# Patient Record
Sex: Male | Born: 1935 | Race: White | Hispanic: No | Marital: Married | Smoking: Former smoker
Health system: Southern US, Community
[De-identification: ages and names within clinical notes are randomized; demographics above are authoritative.]

## PROBLEM LIST (undated history)

## (undated) DIAGNOSIS — R233 Spontaneous ecchymoses: Secondary | ICD-10-CM

## (undated) DIAGNOSIS — M75100 Unspecified rotator cuff tear or rupture of unspecified shoulder, not specified as traumatic: Secondary | ICD-10-CM

## (undated) DIAGNOSIS — M1990 Unspecified osteoarthritis, unspecified site: Secondary | ICD-10-CM

## (undated) DIAGNOSIS — Z5189 Encounter for other specified aftercare: Secondary | ICD-10-CM

## (undated) DIAGNOSIS — T7840XA Allergy, unspecified, initial encounter: Secondary | ICD-10-CM

## (undated) DIAGNOSIS — K219 Gastro-esophageal reflux disease without esophagitis: Secondary | ICD-10-CM

## (undated) DIAGNOSIS — R238 Other skin changes: Secondary | ICD-10-CM

## (undated) DIAGNOSIS — K274 Chronic or unspecified peptic ulcer, site unspecified, with hemorrhage: Secondary | ICD-10-CM

## (undated) DIAGNOSIS — H409 Unspecified glaucoma: Secondary | ICD-10-CM

## (undated) DIAGNOSIS — K222 Esophageal obstruction: Secondary | ICD-10-CM

## (undated) DIAGNOSIS — I10 Essential (primary) hypertension: Secondary | ICD-10-CM

## (undated) DIAGNOSIS — K449 Diaphragmatic hernia without obstruction or gangrene: Secondary | ICD-10-CM

## (undated) DIAGNOSIS — J309 Allergic rhinitis, unspecified: Secondary | ICD-10-CM

## (undated) DIAGNOSIS — K5730 Diverticulosis of large intestine without perforation or abscess without bleeding: Secondary | ICD-10-CM

## (undated) DIAGNOSIS — N400 Enlarged prostate without lower urinary tract symptoms: Secondary | ICD-10-CM

## (undated) DIAGNOSIS — K279 Peptic ulcer, site unspecified, unspecified as acute or chronic, without hemorrhage or perforation: Secondary | ICD-10-CM

## (undated) DIAGNOSIS — I2510 Atherosclerotic heart disease of native coronary artery without angina pectoris: Secondary | ICD-10-CM

## (undated) DIAGNOSIS — E785 Hyperlipidemia, unspecified: Secondary | ICD-10-CM

## (undated) DIAGNOSIS — K648 Other hemorrhoids: Secondary | ICD-10-CM

## (undated) DIAGNOSIS — I219 Acute myocardial infarction, unspecified: Secondary | ICD-10-CM

## (undated) HISTORY — PX: COLONOSCOPY: SHX174

## (undated) HISTORY — DX: Esophageal obstruction: K22.2

## (undated) HISTORY — DX: Unspecified osteoarthritis, unspecified site: M19.90

## (undated) HISTORY — DX: Unspecified rotator cuff tear or rupture of unspecified shoulder, not specified as traumatic: M75.100

## (undated) HISTORY — DX: Peptic ulcer, site unspecified, unspecified as acute or chronic, without hemorrhage or perforation: K27.9

## (undated) HISTORY — PX: UPPER GASTROINTESTINAL ENDOSCOPY: SHX188

## (undated) HISTORY — DX: Gastro-esophageal reflux disease without esophagitis: K21.9

## (undated) HISTORY — DX: Atherosclerotic heart disease of native coronary artery without angina pectoris: I25.10

## (undated) HISTORY — DX: Unspecified glaucoma: H40.9

## (undated) HISTORY — PX: EYE SURGERY: SHX253

## (undated) HISTORY — DX: Essential (primary) hypertension: I10

## (undated) HISTORY — PX: CATARACT EXTRACTION: SUR2

## (undated) HISTORY — PX: TONSILLECTOMY: SUR1361

## (undated) HISTORY — DX: Allergic rhinitis, unspecified: J30.9

## (undated) HISTORY — DX: Other hemorrhoids: K64.8

## (undated) HISTORY — PX: CORONARY ANGIOPLASTY WITH STENT PLACEMENT: SHX49

## (undated) HISTORY — PX: UPPER GI ENDOSCOPY: SHX6162

## (undated) HISTORY — DX: Hyperlipidemia, unspecified: E78.5

## (undated) HISTORY — DX: Encounter for other specified aftercare: Z51.89

## (undated) HISTORY — DX: Diverticulosis of large intestine without perforation or abscess without bleeding: K57.30

## (undated) HISTORY — DX: Diaphragmatic hernia without obstruction or gangrene: K44.9

## (undated) HISTORY — PX: CORONARY ARTERY BYPASS GRAFT: SHX141

## (undated) HISTORY — PX: CORONARY ANGIOPLASTY: SHX604

## (undated) HISTORY — DX: Allergy, unspecified, initial encounter: T78.40XA

---

## 1983-11-30 DIAGNOSIS — K274 Chronic or unspecified peptic ulcer, site unspecified, with hemorrhage: Secondary | ICD-10-CM

## 1983-11-30 DIAGNOSIS — K284 Chronic or unspecified gastrojejunal ulcer with hemorrhage: Secondary | ICD-10-CM

## 1983-11-30 HISTORY — DX: Chronic or unspecified peptic ulcer, site unspecified, with hemorrhage: K27.4

## 1983-11-30 HISTORY — DX: Chronic or unspecified gastrojejunal ulcer with hemorrhage: K28.4

## 1985-11-29 DIAGNOSIS — I219 Acute myocardial infarction, unspecified: Secondary | ICD-10-CM

## 1985-11-29 HISTORY — DX: Acute myocardial infarction, unspecified: I21.9

## 1995-05-09 ENCOUNTER — Encounter (INDEPENDENT_AMBULATORY_CARE_PROVIDER_SITE_OTHER): Payer: Self-pay | Admitting: *Deleted

## 1995-05-10 ENCOUNTER — Encounter (INDEPENDENT_AMBULATORY_CARE_PROVIDER_SITE_OTHER): Payer: Self-pay | Admitting: *Deleted

## 1996-05-05 ENCOUNTER — Encounter (INDEPENDENT_AMBULATORY_CARE_PROVIDER_SITE_OTHER): Payer: Self-pay | Admitting: *Deleted

## 1996-07-09 ENCOUNTER — Encounter (INDEPENDENT_AMBULATORY_CARE_PROVIDER_SITE_OTHER): Payer: Self-pay | Admitting: *Deleted

## 1997-11-07 ENCOUNTER — Encounter (INDEPENDENT_AMBULATORY_CARE_PROVIDER_SITE_OTHER): Payer: Self-pay | Admitting: *Deleted

## 1998-06-03 ENCOUNTER — Ambulatory Visit (HOSPITAL_COMMUNITY): Admission: RE | Admit: 1998-06-03 | Discharge: 1998-06-03 | Payer: Self-pay | Admitting: Vascular Surgery

## 1998-06-20 ENCOUNTER — Ambulatory Visit (HOSPITAL_COMMUNITY): Admission: RE | Admit: 1998-06-20 | Discharge: 1998-06-20 | Payer: Self-pay | Admitting: Cardiovascular Disease

## 1998-07-02 ENCOUNTER — Ambulatory Visit (HOSPITAL_COMMUNITY): Admission: RE | Admit: 1998-07-02 | Discharge: 1998-07-02 | Payer: Self-pay | Admitting: *Deleted

## 1998-07-04 ENCOUNTER — Inpatient Hospital Stay (HOSPITAL_COMMUNITY): Admission: AD | Admit: 1998-07-04 | Discharge: 1998-07-08 | Payer: Self-pay | Admitting: Cardiovascular Disease

## 1999-11-19 ENCOUNTER — Encounter: Payer: Self-pay | Admitting: Rheumatology

## 1999-11-19 ENCOUNTER — Encounter: Admission: RE | Admit: 1999-11-19 | Discharge: 1999-11-19 | Payer: Self-pay | Admitting: Rheumatology

## 2001-02-07 HISTORY — PX: CARDIOVASCULAR STRESS TEST: SHX262

## 2003-01-12 ENCOUNTER — Inpatient Hospital Stay (HOSPITAL_COMMUNITY): Admission: EM | Admit: 2003-01-12 | Discharge: 2003-01-14 | Payer: Self-pay

## 2003-01-14 HISTORY — PX: CARDIAC CATHETERIZATION: SHX172

## 2003-05-20 ENCOUNTER — Encounter (INDEPENDENT_AMBULATORY_CARE_PROVIDER_SITE_OTHER): Payer: Self-pay | Admitting: *Deleted

## 2003-07-10 ENCOUNTER — Ambulatory Visit (HOSPITAL_COMMUNITY): Admission: RE | Admit: 2003-07-10 | Discharge: 2003-07-10 | Payer: Self-pay | Admitting: *Deleted

## 2004-09-30 ENCOUNTER — Ambulatory Visit: Payer: Self-pay | Admitting: Internal Medicine

## 2004-12-30 ENCOUNTER — Ambulatory Visit: Payer: Self-pay | Admitting: Internal Medicine

## 2005-01-06 ENCOUNTER — Ambulatory Visit: Payer: Self-pay | Admitting: Internal Medicine

## 2005-01-18 ENCOUNTER — Inpatient Hospital Stay (HOSPITAL_BASED_OUTPATIENT_CLINIC_OR_DEPARTMENT_OTHER): Admission: RE | Admit: 2005-01-18 | Discharge: 2005-01-18 | Payer: Self-pay | Admitting: Cardiovascular Disease

## 2005-01-18 HISTORY — PX: CARDIAC CATHETERIZATION: SHX172

## 2005-05-26 ENCOUNTER — Ambulatory Visit: Payer: Self-pay | Admitting: Internal Medicine

## 2005-07-06 ENCOUNTER — Ambulatory Visit: Payer: Self-pay | Admitting: Internal Medicine

## 2005-07-09 ENCOUNTER — Ambulatory Visit: Payer: Self-pay | Admitting: Internal Medicine

## 2005-08-23 HISTORY — PX: US ECHOCARDIOGRAPHY: HXRAD669

## 2005-11-15 ENCOUNTER — Ambulatory Visit: Payer: Self-pay | Admitting: Internal Medicine

## 2006-05-09 ENCOUNTER — Encounter (INDEPENDENT_AMBULATORY_CARE_PROVIDER_SITE_OTHER): Payer: Self-pay | Admitting: *Deleted

## 2006-05-16 ENCOUNTER — Ambulatory Visit: Payer: Self-pay | Admitting: Internal Medicine

## 2006-05-30 ENCOUNTER — Ambulatory Visit (HOSPITAL_COMMUNITY): Admission: RE | Admit: 2006-05-30 | Discharge: 2006-05-30 | Payer: Self-pay | Admitting: *Deleted

## 2006-05-30 ENCOUNTER — Encounter (INDEPENDENT_AMBULATORY_CARE_PROVIDER_SITE_OTHER): Payer: Self-pay | Admitting: Specialist

## 2006-05-30 ENCOUNTER — Encounter (INDEPENDENT_AMBULATORY_CARE_PROVIDER_SITE_OTHER): Payer: Self-pay | Admitting: *Deleted

## 2006-07-14 ENCOUNTER — Encounter (INDEPENDENT_AMBULATORY_CARE_PROVIDER_SITE_OTHER): Payer: Self-pay | Admitting: *Deleted

## 2006-11-08 ENCOUNTER — Ambulatory Visit: Payer: Self-pay | Admitting: Internal Medicine

## 2006-11-08 LAB — CONVERTED CEMR LAB
ALT: 21 units/L (ref 0–40)
AST: 26 units/L (ref 0–37)
Albumin: 4.1 g/dL (ref 3.5–5.2)
Alkaline Phosphatase: 47 units/L (ref 39–117)
BUN: 10 mg/dL (ref 6–23)
Bilirubin, Direct: 0.1 mg/dL (ref 0.0–0.3)
CO2: 33 meq/L — ABNORMAL HIGH (ref 19–32)
Calcium: 10 mg/dL (ref 8.4–10.5)
Chloride: 103 meq/L (ref 96–112)
Chol/HDL Ratio, serum: 3.3
Cholesterol: 161 mg/dL (ref 0–200)
Creatinine, Ser: 1 mg/dL (ref 0.4–1.5)
GFR calc non Af Amer: 78 mL/min
Glomerular Filtration Rate, Af Am: 95 mL/min/{1.73_m2}
Glucose, Bld: 98 mg/dL (ref 70–99)
HCT: 41.2 % (ref 39.0–52.0)
HDL: 48.2 mg/dL (ref >39.0)
Hemoglobin: 14.1 g/dL (ref 13.0–17.0)
LDL Cholesterol: 89 mg/dL (ref 0–99)
MCHC: 34.3 g/dL (ref 30.0–36.0)
MCV: 96 fL (ref 78.0–100.0)
PSA: 0.79 ng/mL (ref 0.10–4.00)
Platelets: 193 10*3/uL (ref 150–400)
Potassium: 5.1 meq/L (ref 3.5–5.1)
RBC: 4.29 M/uL (ref 4.22–5.81)
RDW: 12.8 % (ref 11.5–14.6)
Sodium: 142 meq/L (ref 135–145)
Total Bilirubin: 1.1 mg/dL (ref 0.3–1.2)
Total Protein: 7.3 g/dL (ref 6.0–8.3)
Triglyceride fasting, serum: 119 mg/dL (ref 0–149)
VLDL: 24 mg/dL (ref 0–40)
WBC: 4 10*3/uL — ABNORMAL LOW (ref 4.5–10.5)

## 2006-12-20 ENCOUNTER — Ambulatory Visit: Payer: Self-pay | Admitting: Internal Medicine

## 2007-01-30 ENCOUNTER — Ambulatory Visit: Payer: Self-pay | Admitting: Internal Medicine

## 2007-01-30 LAB — CONVERTED CEMR LAB
AST: 33 units/L (ref 0–37)
BUN: 16 mg/dL (ref 6–23)
CO2: 34 meq/L — ABNORMAL HIGH (ref 19–32)
Calcium: 9.8 mg/dL (ref 8.4–10.5)
Chloride: 108 meq/L (ref 96–112)
Cholesterol: 139 mg/dL (ref 0–200)
Creatinine, Ser: 0.7 mg/dL (ref 0.4–1.5)
GFR calc Af Amer: 143 mL/min
GFR calc non Af Amer: 118 mL/min
Glucose, Bld: 126 mg/dL — ABNORMAL HIGH (ref 70–99)
Potassium: 4.9 meq/L (ref 3.5–5.1)
Sodium: 145 meq/L (ref 135–145)

## 2007-05-02 ENCOUNTER — Ambulatory Visit: Payer: Self-pay | Admitting: Internal Medicine

## 2007-05-04 ENCOUNTER — Encounter: Payer: Self-pay | Admitting: Internal Medicine

## 2007-05-04 DIAGNOSIS — I10 Essential (primary) hypertension: Secondary | ICD-10-CM

## 2007-05-04 DIAGNOSIS — I2510 Atherosclerotic heart disease of native coronary artery without angina pectoris: Secondary | ICD-10-CM | POA: Insufficient documentation

## 2007-05-04 DIAGNOSIS — K279 Peptic ulcer, site unspecified, unspecified as acute or chronic, without hemorrhage or perforation: Secondary | ICD-10-CM

## 2007-05-04 DIAGNOSIS — M1990 Unspecified osteoarthritis, unspecified site: Secondary | ICD-10-CM

## 2007-05-04 DIAGNOSIS — E785 Hyperlipidemia, unspecified: Secondary | ICD-10-CM

## 2007-05-04 DIAGNOSIS — K219 Gastro-esophageal reflux disease without esophagitis: Secondary | ICD-10-CM

## 2007-05-04 DIAGNOSIS — J309 Allergic rhinitis, unspecified: Secondary | ICD-10-CM

## 2007-05-04 HISTORY — DX: Peptic ulcer, site unspecified, unspecified as acute or chronic, without hemorrhage or perforation: K27.9

## 2007-05-04 HISTORY — DX: Essential (primary) hypertension: I10

## 2007-05-04 HISTORY — DX: Allergic rhinitis, unspecified: J30.9

## 2007-05-04 HISTORY — DX: Unspecified osteoarthritis, unspecified site: M19.90

## 2007-05-04 HISTORY — DX: Atherosclerotic heart disease of native coronary artery without angina pectoris: I25.10

## 2007-05-04 HISTORY — DX: Gastro-esophageal reflux disease without esophagitis: K21.9

## 2007-05-04 HISTORY — DX: Hyperlipidemia, unspecified: E78.5

## 2007-06-12 ENCOUNTER — Inpatient Hospital Stay (HOSPITAL_BASED_OUTPATIENT_CLINIC_OR_DEPARTMENT_OTHER): Admission: RE | Admit: 2007-06-12 | Discharge: 2007-06-12 | Payer: Self-pay | Admitting: Cardiovascular Disease

## 2007-06-12 HISTORY — PX: CARDIAC CATHETERIZATION: SHX172

## 2007-06-28 ENCOUNTER — Encounter: Payer: Self-pay | Admitting: Internal Medicine

## 2007-08-14 ENCOUNTER — Ambulatory Visit: Payer: Self-pay | Admitting: Internal Medicine

## 2007-11-24 ENCOUNTER — Ambulatory Visit: Payer: Self-pay | Admitting: Internal Medicine

## 2007-11-24 DIAGNOSIS — J069 Acute upper respiratory infection, unspecified: Secondary | ICD-10-CM | POA: Insufficient documentation

## 2007-12-14 ENCOUNTER — Ambulatory Visit: Payer: Self-pay | Admitting: Internal Medicine

## 2008-04-17 ENCOUNTER — Ambulatory Visit: Payer: Self-pay | Admitting: Internal Medicine

## 2008-04-17 LAB — CONVERTED CEMR LAB
ALT: 20 units/L (ref 0–53)
AST: 23 units/L (ref 0–37)
Albumin: 4.1 g/dL (ref 3.5–5.2)
Alkaline Phosphatase: 43 units/L (ref 39–117)
BUN: 14 mg/dL (ref 6–23)
Basophils Absolute: 0 10*3/uL (ref 0.0–0.1)
Basophils Relative: 0.4 % (ref 0.0–1.0)
Bilirubin Urine: NEGATIVE
Bilirubin, Direct: 0.1 mg/dL (ref 0.0–0.3)
Blood in Urine, dipstick: NEGATIVE
CO2: 32 meq/L (ref 19–32)
Calcium: 9.8 mg/dL (ref 8.4–10.5)
Chloride: 105 meq/L (ref 96–112)
Cholesterol: 157 mg/dL (ref 0–200)
Creatinine, Ser: 0.9 mg/dL (ref 0.4–1.5)
Eosinophils Absolute: 0.3 10*3/uL (ref 0.0–0.7)
Eosinophils Relative: 6.6 % — ABNORMAL HIGH (ref 0.0–5.0)
GFR calc Af Amer: 106 mL/min
GFR calc non Af Amer: 88 mL/min
Glucose, Bld: 114 mg/dL — ABNORMAL HIGH (ref 70–99)
Glucose, Urine, Semiquant: NEGATIVE
HCT: 42.6 % (ref 39.0–52.0)
HDL: 45.3 mg/dL (ref >39.0)
Hemoglobin: 14.3 g/dL (ref 13.0–17.0)
LDL Cholesterol: 96 mg/dL (ref 0–99)
Lymphocytes Relative: 21.3 % (ref 12.0–46.0)
MCHC: 33.6 g/dL (ref 30.0–36.0)
MCV: 98.7 fL (ref 78.0–100.0)
Monocytes Absolute: 0.6 10*3/uL (ref 0.1–1.0)
Monocytes Relative: 12.7 % — ABNORMAL HIGH (ref 3.0–12.0)
Neutro Abs: 2.6 10*3/uL (ref 1.4–7.7)
Neutrophils Relative %: 59 % (ref 43.0–77.0)
Nitrite: NEGATIVE
PSA: 0.87 ng/mL (ref 0.10–4.00)
Platelets: 203 10*3/uL (ref 150–400)
Potassium: 5.2 meq/L — ABNORMAL HIGH (ref 3.5–5.1)
RBC: 4.32 M/uL (ref 4.22–5.81)
RDW: 12.8 % (ref 11.5–14.6)
Sodium: 140 meq/L (ref 135–145)
Specific Gravity, Urine: 1.025
TSH: 1.04 microintl units/mL (ref 0.35–5.50)
Total Bilirubin: 1 mg/dL (ref 0.3–1.2)
Total CHOL/HDL Ratio: 3.5
Total Protein: 7.3 g/dL (ref 6.0–8.3)
Triglycerides: 79 mg/dL (ref 0–149)
Urobilinogen, UA: 0.2
VLDL: 16 mg/dL (ref 0–40)
WBC Urine, dipstick: NEGATIVE
WBC: 4.5 10*3/uL (ref 4.5–10.5)
pH: 6

## 2008-04-24 ENCOUNTER — Ambulatory Visit: Payer: Self-pay | Admitting: Internal Medicine

## 2008-09-20 ENCOUNTER — Ambulatory Visit: Payer: Self-pay | Admitting: Family Medicine

## 2008-09-20 DIAGNOSIS — J209 Acute bronchitis, unspecified: Secondary | ICD-10-CM

## 2008-10-21 ENCOUNTER — Ambulatory Visit: Payer: Self-pay | Admitting: Internal Medicine

## 2008-11-11 ENCOUNTER — Telehealth: Payer: Self-pay | Admitting: Internal Medicine

## 2008-11-14 ENCOUNTER — Encounter: Payer: Self-pay | Admitting: Internal Medicine

## 2009-04-03 ENCOUNTER — Ambulatory Visit: Payer: Self-pay | Admitting: Internal Medicine

## 2009-04-10 ENCOUNTER — Encounter: Payer: Self-pay | Admitting: Internal Medicine

## 2009-07-23 ENCOUNTER — Ambulatory Visit: Payer: Self-pay | Admitting: Family Medicine

## 2009-07-30 ENCOUNTER — Telehealth: Payer: Self-pay | Admitting: Internal Medicine

## 2009-07-30 DIAGNOSIS — R1013 Epigastric pain: Secondary | ICD-10-CM | POA: Insufficient documentation

## 2009-08-14 DIAGNOSIS — K5730 Diverticulosis of large intestine without perforation or abscess without bleeding: Secondary | ICD-10-CM

## 2009-08-14 DIAGNOSIS — K222 Esophageal obstruction: Secondary | ICD-10-CM

## 2009-08-14 DIAGNOSIS — K449 Diaphragmatic hernia without obstruction or gangrene: Secondary | ICD-10-CM | POA: Insufficient documentation

## 2009-08-14 HISTORY — DX: Diaphragmatic hernia without obstruction or gangrene: K44.9

## 2009-08-14 HISTORY — DX: Esophageal obstruction: K22.2

## 2009-08-14 HISTORY — DX: Diverticulosis of large intestine without perforation or abscess without bleeding: K57.30

## 2009-08-18 ENCOUNTER — Ambulatory Visit: Payer: Self-pay | Admitting: Internal Medicine

## 2009-08-19 LAB — CONVERTED CEMR LAB
Basophils Absolute: 0 10*3/uL (ref 0.0–0.1)
Basophils Relative: 0.3 % (ref 0.0–3.0)
Eosinophils Absolute: 0.2 10*3/uL (ref 0.0–0.7)
Eosinophils Relative: 3.7 % (ref 0.0–5.0)
HCT: 40.9 % (ref 39.0–52.0)
Hemoglobin: 14.1 g/dL (ref 13.0–17.0)
Lymphocytes Relative: 15.5 % (ref 12.0–46.0)
Lymphs Abs: 1 10*3/uL (ref 0.7–4.0)
MCHC: 34.5 g/dL (ref 30.0–36.0)
MCV: 98.9 fL (ref 78.0–100.0)
Monocytes Absolute: 0.7 10*3/uL (ref 0.1–1.0)
Monocytes Relative: 10.7 % (ref 3.0–12.0)
Neutro Abs: 4.6 10*3/uL (ref 1.4–7.7)
Neutrophils Relative %: 69.8 % (ref 43.0–77.0)
Platelets: 204 10*3/uL (ref 150.0–400.0)
RBC: 4.14 M/uL — ABNORMAL LOW (ref 4.22–5.81)
RDW: 12.4 % (ref 11.5–14.6)
WBC: 6.5 10*3/uL (ref 4.5–10.5)

## 2009-09-03 ENCOUNTER — Encounter (INDEPENDENT_AMBULATORY_CARE_PROVIDER_SITE_OTHER): Payer: Self-pay

## 2009-09-09 ENCOUNTER — Ambulatory Visit: Payer: Self-pay | Admitting: Internal Medicine

## 2009-10-06 ENCOUNTER — Ambulatory Visit: Payer: Self-pay | Admitting: Internal Medicine

## 2009-10-06 LAB — CONVERTED CEMR LAB
ALT: 19 units/L (ref 0–53)
AST: 21 units/L (ref 0–37)
Albumin: 4 g/dL (ref 3.5–5.2)
Alkaline Phosphatase: 47 units/L (ref 39–117)
BUN: 14 mg/dL (ref 6–23)
Bilirubin, Direct: 0.1 mg/dL (ref 0.0–0.3)
CO2: 31 meq/L (ref 19–32)
Calcium: 9.4 mg/dL (ref 8.4–10.5)
Chloride: 104 meq/L (ref 96–112)
Cholesterol: 159 mg/dL (ref 0–200)
Creatinine, Ser: 0.8 mg/dL (ref 0.4–1.5)
GFR calc non Af Amer: 100.21 mL/min (ref >60)
Glucose, Bld: 108 mg/dL — ABNORMAL HIGH (ref 70–99)
HDL: 45.9 mg/dL (ref >39.00)
LDL Cholesterol: 94 mg/dL (ref 0–99)
PSA: 1.01 ng/mL (ref 0.10–4.00)
Potassium: 4.4 meq/L (ref 3.5–5.1)
Sodium: 143 meq/L (ref 135–145)
TSH: 0.75 microintl units/mL (ref 0.35–5.50)
Total Bilirubin: 1 mg/dL (ref 0.3–1.2)
Total CHOL/HDL Ratio: 3
Total Protein: 6.8 g/dL (ref 6.0–8.3)
Triglycerides: 98 mg/dL (ref 0.0–149.0)
VLDL: 19.6 mg/dL (ref 0.0–40.0)

## 2009-10-21 ENCOUNTER — Encounter (INDEPENDENT_AMBULATORY_CARE_PROVIDER_SITE_OTHER): Payer: Self-pay | Admitting: *Deleted

## 2010-01-07 ENCOUNTER — Telehealth: Payer: Self-pay | Admitting: Internal Medicine

## 2010-04-06 ENCOUNTER — Ambulatory Visit: Payer: Self-pay | Admitting: Internal Medicine

## 2010-05-14 ENCOUNTER — Telehealth: Payer: Self-pay | Admitting: Internal Medicine

## 2010-05-20 ENCOUNTER — Ambulatory Visit: Payer: Self-pay | Admitting: Internal Medicine

## 2010-06-26 ENCOUNTER — Ambulatory Visit: Payer: Self-pay | Admitting: Internal Medicine

## 2010-06-26 DIAGNOSIS — S61209A Unspecified open wound of unspecified finger without damage to nail, initial encounter: Secondary | ICD-10-CM | POA: Insufficient documentation

## 2010-07-22 ENCOUNTER — Ambulatory Visit: Payer: Self-pay | Admitting: Cardiovascular Disease

## 2010-09-17 ENCOUNTER — Telehealth: Payer: Self-pay | Admitting: Internal Medicine

## 2010-10-08 ENCOUNTER — Ambulatory Visit: Payer: Self-pay | Admitting: Internal Medicine

## 2010-10-08 LAB — CONVERTED CEMR LAB
BUN: 13 mg/dL (ref 6–23)
Basophils Absolute: 0 10*3/uL (ref 0.0–0.1)
Bilirubin, Direct: 0.1 mg/dL (ref 0.0–0.3)
Chloride: 106 meq/L (ref 96–112)
Creatinine, Ser: 0.7 mg/dL (ref 0.4–1.5)
Eosinophils Absolute: 0.3 10*3/uL (ref 0.0–0.7)
Eosinophils Relative: 7 % — ABNORMAL HIGH (ref 0.0–5.0)
Glucose, Bld: 101 mg/dL — ABNORMAL HIGH (ref 70–99)
HCT: 40.4 % (ref 39.0–52.0)
HDL: 40.2 mg/dL (ref >39.00)
Lymphs Abs: 1 10*3/uL (ref 0.7–4.0)
MCV: 99.4 fL (ref 78.0–100.0)
Monocytes Absolute: 0.5 10*3/uL (ref 0.1–1.0)
Neutrophils Relative %: 60.1 % (ref 43.0–77.0)
PSA: 1.02 ng/mL (ref 0.10–4.00)
Platelets: 195 10*3/uL (ref 150.0–400.0)
Potassium: 5.1 meq/L (ref 3.5–5.1)
RDW: 13.1 % (ref 11.5–14.6)
Sed Rate: 11 mm/hr (ref 0–22)
TSH: 0.74 microintl units/mL (ref 0.35–5.50)
Total Bilirubin: 0.8 mg/dL (ref 0.3–1.2)
WBC: 4.7 10*3/uL (ref 4.5–10.5)

## 2010-11-05 ENCOUNTER — Ambulatory Visit: Payer: Self-pay | Admitting: Internal Medicine

## 2010-11-05 LAB — CONVERTED CEMR LAB
CRP, High Sensitivity: 5 (ref 0.00–5.00)
Sed Rate: 6 mm/hr (ref 0–22)

## 2010-11-13 ENCOUNTER — Telehealth: Payer: Self-pay | Admitting: Internal Medicine

## 2010-12-14 ENCOUNTER — Telehealth: Payer: Self-pay | Admitting: Internal Medicine

## 2010-12-29 NOTE — Assessment & Plan Note (Signed)
Summary: recheck hypotension/dm   Vital Signs:  Patient profile:   77 year old male Weight:      175 pounds Temp:     98.1 degrees F oral BP sitting:   142 / 80  (right arm) Cuff size:   regular  Vitals Entered By: Duard Brady LPN (May 20, 2010 9:20 AM) CC: c/o BP running low , faitgue Is Patient Diabetic? No   Primary Care Provider:  Beverely Low, MD  CC:  c/o BP running low  and faitgue.  History of Present Illness: 77 year old patient who is seen today for follow-up.  He experienced considerable fatigue since his last visit and was noted to be hypotensive.  He discontinued his blood pressure medication and within 48 hours felt much improved.  He is also discontinue Crestor due to myalgias.  He has been intolerant of a number of statin drugs.  Crestor seem to have less of an affect, but still quite bothersome.  His aspirin and Plavix are  also on hold pending a cervical epidural.  Allergies (verified): No Known Drug Allergies  Review of Systems       The patient complains of muscle weakness and difficulty walking.  The patient denies anorexia, fever, weight loss, weight gain, vision loss, decreased hearing, hoarseness, chest pain, syncope, dyspnea on exertion, peripheral edema, prolonged cough, headaches, hemoptysis, abdominal pain, melena, hematochezia, severe indigestion/heartburn, hematuria, incontinence, genital sores, suspicious skin lesions, transient blindness, depression, unusual weight change, abnormal bleeding, enlarged lymph nodes, angioedema, breast masses, and testicular masses.    Physical Exam  General:  overweight-appearing.  140/80overweight-appearing.   Head:  Normocephalic and atraumatic without obvious abnormalities. No apparent alopecia or balding. Mouth:  Oral mucosa and oropharynx without lesions or exudates.  Teeth in good repair. Neck:  No deformities, masses, or tenderness noted. Lungs:  Normal respiratory effort, chest expands  symmetrically. Lungs are clear to auscultation, no crackles or wheezes. Heart:  Normal rate and regular rhythm. S1 and S2 normal without gallop, murmur, click, rub or other extra sounds. Abdomen:  Bowel sounds positive,abdomen soft and non-tender without masses, organomegaly or hernias noted.   Impression & Recommendations:  Problem # 1:  HYPERTENSION (ICD-401.9)  The following medications were removed from the medication list:    Benazepril Hcl 20 Mg Tabs (Benazepril hcl) .Marland Kitchen... 1 once daily His updated medication list for this problem includes:    Benazepril Hcl 10 Mg Tabs (Benazepril hcl) ..... One daily  The following medications were removed from the medication list:    Benazepril Hcl 20 Mg Tabs (Benazepril hcl) .Marland Kitchen... 1 once daily His updated medication list for this problem includes:    Benazepril Hcl 10 Mg Tabs (Benazepril hcl) ..... One daily  Problem # 2:  CORONARY ARTERY DISEASE (ICD-414.00)  The following medications were removed from the medication list:    Benazepril Hcl 20 Mg Tabs (Benazepril hcl) .Marland Kitchen... 1 once daily His updated medication list for this problem includes:    Aspirin 81 Mg Tbec (Aspirin) .Marland Kitchen... Take 1 tablet by mouth once a day    Plavix 75 Mg Tabs (Clopidogrel bisulfate) .Marland Kitchen... 1 once daily    Benazepril Hcl 10 Mg Tabs (Benazepril hcl) ..... One daily will rechallenge with Crestor 10 mg once weekly.  If this is well tolerated.  Will try twice weekly  The following medications were removed from the medication list:    Benazepril Hcl 20 Mg Tabs (Benazepril hcl) .Marland Kitchen... 1 once daily His updated medication list for this problem includes:  Aspirin 81 Mg Tbec (Aspirin) .Marland Kitchen... Take 1 tablet by mouth once a day    Plavix 75 Mg Tabs (Clopidogrel bisulfate) .Marland Kitchen... 1 once daily    Benazepril Hcl 10 Mg Tabs (Benazepril hcl) ..... One daily  Complete Medication List: 1)  Aspirin 81 Mg Tbec (Aspirin) .... Take 1 tablet by mouth once a day 2)  Celebrex 200 Mg Caps  (Celecoxib) .Marland Kitchen.. 1 two times a day 3)  Claritin 10 Mg Tabs (Loratadine) .... Take 1 tablet by mouth once a day 4)  Nexium 40 Mg Cpdr (Esomeprazole magnesium) .Marland Kitchen.. 1 twice daily 5)  Plavix 75 Mg Tabs (Clopidogrel bisulfate) .Marland Kitchen.. 1 once daily 6)  Temazepam 30 Mg Caps (Temazepam) .Marland Kitchen.. 1 at bedtime 7)  Crestor 10 Mg Tabs (Rosuvastatin calcium) .Marland Kitchen.. 1 twice weekly 8)  Desoximetasone 0.05 % Gel (Desoximetasone) .... Used twice daily as needed 9)  Preservision/lutein Caps (Multiple vitamins-minerals) .... Take 1 tablet by mouth two times a day 10)  Glucosamine-chondroitin Tabs (Glucosamine-chondroit-vit c-mn) .... Take 1 tablet by mouth once a day 11)  Fish Oil 1200 Mg Caps (Omega-3 fatty acids) .... Take 3 capsules by mouth once daily 12)  Benazepril Hcl 10 Mg Tabs (Benazepril hcl) .... One daily  Patient Instructions: 1)  Please schedule a follow-up appointment in 3 months. 2)  Limit your Sodium (Salt). 3)  It is important that you exercise regularly at least 20 minutes 5 times a week. If you develop chest pain, have severe difficulty breathing, or feel very tired , stop exercising immediately and seek medical attention. Prescriptions: DESOXIMETASONE 0.05 % GEL (DESOXIMETASONE) used twice daily as needed  #60 gm x 3   Entered and Authorized by:   Gordy Savers  MD   Signed by:   Gordy Savers  MD on 05/20/2010   Method used:   [facility] to        [facility] [phone]* (retail)       [address]       [place], [place]  [address]       Ph: [phone] or [phone]       Fax: [phone]   RxID:   [id number] BENAZEPRIL HCL 10 MG TABS (BENAZEPRIL HCL) one daily  #90 x 6   Entered and Authorized by:   Gordy Savers  MD   Signed by:   Gordy Savers  MD on 05/20/2010   Method used:   Print then Give to Patient   RxID:   [id number] BENAZEPRIL HCL 10 MG TABS (BENAZEPRIL HCL) one daily  #90 x 6   Entered and Authorized by:    Gordy Savers  MD   Signed by:   Gordy Savers  MD on 05/20/2010   Method used:   [facility] to        [facility] [phone]* (retail)       [address]       [place], [place]  [address]       Ph: [phone] or [phone]       Fax: [phone]   RxID:   [id number]

## 2010-12-29 NOTE — Progress Notes (Signed)
Summary: refill benazepril  Phone Note Refill Request Message from:  Fax from Pharmacy  Refills Requested: Medication #1:  BENAZEPRIL HCL 20 MG TABS 1 once daily [facility]  fax---[phone]  Initial call taken by: Warnell Forester,  January 07, 2010 8:47 AM    Prescriptions: BENAZEPRIL HCL 20 MG TABS (BENAZEPRIL HCL) 1 once daily  #90 x 6   Entered by:   Raechel Ache, RN   Authorized by:   Gordy Savers  MD   Signed by:   Raechel Ache, RN on 01/07/2010   Method used:   Faxed to ...       [facility]* (mail-order)             ,          Ph: [phone]       Fax: [phone]   RxID:   [id number]

## 2010-12-29 NOTE — Assessment & Plan Note (Signed)
Summary: 1 month rov/njr   Vital Signs:  Patient profile:   77 year old male Weight:      168 pounds Temp:     98.0 degrees F oral BP sitting:   120 / 80  (right arm) Cuff size:   regular  Vitals Entered By: Duard Brady LPN (November 05, 2010 8:16 AM) CC: 1 mos rov - still with congested cough Is Patient Diabetic? No   Primary Care Provider:  Beverely Low, MD  CC:  1 mos rov - still with congested cough.  History of Present Illness: 58  your old patient who is seen today in follow up.  He was seen one month ago with a 59-month history of increasing generalized pain especially worse through the night and with prolonged sitting and in activity.  He has a long history of osteoarthritis and has been on chronic Celebrex, and sedimentation rate was normal.  He was given a try with prednisone 10 mg b.i.d. and had dramatic improvement after a single dose.  He was treated for 10 days with great improvement, but within two days of stopping the medication, and generalized pain is back to baseline.  He is scheduled for rheumatology evaluation at [facility] in February. He has treated hypertension and dyslipidemia, which has been stable  Allergies (verified): No Known Drug Allergies  Past History:  Past Medical History: Reviewed history from 04/03/2009 and no changes required. Coronary artery disease GERD stricture formation Hyperlipidemia Hypertension Peptic ulcer disease Allergic rhinitis DJD Osteoarthritis  Review of Systems       The patient complains of muscle weakness and difficulty walking.    Physical Exam  General:  Well-developed,well-nourished,in no acute distress; alert,appropriate and cooperative throughout examination Head:  Normocephalic and atraumatic without obvious abnormalities. No apparent alopecia or balding. Mouth:  Oral mucosa and oropharynx without lesions or exudates.  Teeth in good repair. Neck:  No deformities, masses, or tenderness  noted. Lungs:  Normal respiratory effort, chest expands symmetrically. Lungs are clear to auscultation, no crackles or wheezes. Heart:  Normal rate and regular rhythm. S1 and S2 normal without gallop, murmur, click, rub or other extra sounds. Msk:  osteoarthritic changes involving the small joints of the hands.  No active synovitis   Impression & Recommendations:  Problem # 1:  OSTEOARTHRITIS (ICD-715.90)  His updated medication list for this problem includes:    Aspirin 81 Mg Tbec (Aspirin) .Marland Kitchen... Take 1 tablet by mouth once a day    Celebrex 200 Mg Caps (Celecoxib) .Marland Kitchen... 1 two times a day    Tramadol Hcl 50 Mg Tabs (Tramadol hcl) ..... One every 6 hours as needed for pain patient clearly has a steroid responsive arthritis.  His sedimentation rate is normal.  Unclear whether this represents inflammatory osteoarthritis; possible PMR or other inflammatory rheumatologic condition.  Will follow-up with rheumatology after the first of the year.  Will give him a trial Low-dose prednisone 5 mg daily    His updated medication list for this problem includes:    Aspirin 81 Mg Tbec (Aspirin) .Marland Kitchen... Take 1 tablet by mouth once a day    Celebrex 200 Mg Caps (Celecoxib) .Marland Kitchen... 1 two times a day    Tramadol Hcl 50 Mg Tabs (Tramadol hcl) ..... One every 6 hours as needed for pain  Orders: Venipuncture ([address]) TLB-CRP-High Sensitivity (C-Reactive Protein) (86140-FCRP) TLB-Sedimentation Rate (ESR) (85652-ESR)  Problem # 2:  HYPERTENSION (ICD-401.9)  His updated medication list for this problem includes:    Benazepril  Hcl 10 Mg Tabs (Benazepril hcl) ..... One daily  His updated medication list for this problem includes:    Benazepril Hcl 10 Mg Tabs (Benazepril hcl) ..... One daily  Complete Medication List: 1)  Aspirin 81 Mg Tbec (Aspirin) .... Take 1 tablet by mouth once a day 2)  Celebrex 200 Mg Caps (Celecoxib) .Marland Kitchen.. 1 two times a day 3)  Claritin 10 Mg Tabs (Loratadine) .... Take 1 tablet by  mouth once a day 4)  Nexium 40 Mg Cpdr (Esomeprazole magnesium) .Marland Kitchen.. 1 twice daily 5)  Plavix 75 Mg Tabs (Clopidogrel bisulfate) .Marland Kitchen.. 1 once daily 6)  Temazepam 30 Mg Caps (Temazepam) .Marland Kitchen.. 1 at bedtime 7)  Crestor 10 Mg Tabs (Rosuvastatin calcium) .Marland Kitchen.. 1 twice weekly 8)  Desoximetasone 0.05 % Gel (Desoximetasone) .... Used twice daily as needed 9)  Preservision/lutein Caps (Multiple vitamins-minerals) .... Take 1 tablet by mouth two times a day 10)  Glucosamine-chondroitin Tabs (Glucosamine-chondroit-vit c-mn) .... Take 1 tablet by mouth once a day 11)  Fish Oil 1200 Mg Caps (Omega-3 fatty acids) .... Take 3 capsules by mouth once daily 12)  Benazepril Hcl 10 Mg Tabs (Benazepril hcl) .... One daily 13)  Tramadol Hcl 50 Mg Tabs (Tramadol hcl) .... One every 6 hours as needed for pain 14)  Trazodone Hcl 100 Mg Tabs (Trazodone hcl) .... One one at bedtime 15)  Prednisone 5 Mg Tabs (Prednisone) .... One tablet every morning  Patient Instructions: 1)  rheumatology follow-up as scheduled 2)  hold Celebrex 3)  Please schedule a follow-up appointment in 3 months. 4)  Limit your Sodium (Salt) to less than 2 grams a day(slightly less than 1/2 a teaspoon) to prevent fluid retention, swelling, or worsening of symptoms. Prescriptions: PREDNISONE 5 MG TABS (PREDNISONE) one tablet every morning  #90 x 0   Entered and Authorized by:   Gordy Savers  MD   Signed by:   Gordy Savers  MD on 11/05/2010   Method used:   [facility] to        [facility] [phone]* (retail)       [address]       [place], [place]  [address]       Ph: [phone] or [phone]       Fax: [phone]   RxID:   [id number]    Orders Added: 1)  Est. Patient Level III [36644] 2)  Venipuncture [03474] 3)  TLB-CRP-High Sensitivity (C-Reactive Protein) [86140-FCRP] 4)  TLB-Sedimentation Rate (ESR) [25956-LOV]

## 2010-12-29 NOTE — Assessment & Plan Note (Signed)
Summary: Dakota Green will come in fasting/njr   Vital Signs:  Patient profile:   77 year old male Height:      68.75 inches Weight:      166 pounds BMI:     24.78 Temp:     98.0 degrees F oral BP sitting:   118 / 76  (left arm) Cuff size:   regular  Vitals Entered By: Duard Brady LPN (October 08, 2010 8:32 AM) CC: cpx - c/o head congestion  and cough Is Patient Diabetic? No   Primary Care Provider:  Beverely Low, MD  CC:  cpx - c/o head congestion  and cough.  History of Present Illness:  77 year old patient who is seen today for an annual physical.  he has a long history of arthritis, but for the past 3 months describes significant generalized pain.  He is self referred to rheumatology and has an appointment on February 1.  Denies any swollen joints.  He states that he has pain throughout the day and the night that interferes with his sleep.  He is on Celebrex b.i.d.  He is also on Crestor on a twice weekly regimen.  He also describes restless legs for the past 4 weeks that he feels also interferes with his sleep.  He is requesting antidepressant that he states has helped to have his siblings.  Quite well.  Here for Medicare AWV:  1.   Risk factors based on Past M, S, F history:  patient has known coronary artery disease, history of dyslipidemia, hypertension. 2.   Physical Activities: goes to the gym 4 times weekly 3.   Depression/mood: no history of  depression, or mood disorder 4.   Hearing: no deficits 5.   ADL's: independent in all aspects of daily living 6.   Fall Risk: low 7.   Home Safety: no problems identified 8.   Height, weight, &visual acuity:height and weight stable.  No difficulty with visual acuity 9.   Counseling: heart healthy diet, continued regular exercise, all encouraged 10.   Labs ordered based on risk factors: laboratory profile, including lipid profile, and sedimentation rate will be reviewed 11.           Referral Coordination- rheumatology referral  as scheduled 12.           Care Plan- will follow up with rheumatology.  Will review a sedimentation rate will also discontinue at least temporarily.  The Crestor 13.            Cognitive Assessment- alert and oriented, with normal affect.  No history of memory concerns handles all executive functioning without difficulty   Allergies (verified): No Known Drug Allergies  Past History:  Past Medical History: Reviewed history from 04/03/2009 and no changes required. Coronary artery disease GERD stricture formation Hyperlipidemia Hypertension Peptic ulcer disease Allergic rhinitis DJD Osteoarthritis  Past Surgical History: Reviewed history from 08/18/2009 and no changes required. Coronary artery bypass graft 1999 Cardiac Stent placement-1987, 1998 Cataract extraction-bilaterally   cardiac catheterization February 2009 EGD 2007 colonoscopy 2004  Family History: Reviewed history from 08/18/2009 and no changes required. father died age 21 mother died age 6, MI, hypertension 6 brothers 3 sisters, positive for  lung and prostate cancer, coronary artery disease Family History of Prostate Cancer: Brother No FH of Colon Cancer: Family History of Heart Disease: Mother  Social History: Reviewed history from 08/18/2009 and no changes required. Retired Married Patient is a former smoker. -stopped 1987 Alcohol Use - yes-4 drinks daily Daily Caffeine  Use-1 cup daily Illicit Drug Use - no  Review of Systems       The patient complains of headaches and muscle weakness.  The patient denies anorexia, fever, weight loss, weight gain, vision loss, decreased hearing, hoarseness, chest pain, syncope, dyspnea on exertion, peripheral edema, prolonged cough, hemoptysis, abdominal pain, melena, hematochezia, severe indigestion/heartburn, hematuria, incontinence, genital sores, suspicious skin lesions, transient blindness, difficulty walking, depression, unusual weight change, abnormal bleeding,  enlarged lymph nodes, angioedema, breast masses, and testicular masses.    Physical Exam  General:  Well-developed,well-nourished,in no acute distress; alert,appropriate and cooperative throughout examination Head:  Normocephalic and atraumatic without obvious abnormalities. No apparent alopecia or balding. Eyes:  No corneal or conjunctival inflammation noted. EOMI. Perrla. Funduscopic exam benign, without hemorrhages, exudates or papilledema. Vision grossly normal. Ears:  External ear exam shows no significant lesions or deformities.  Otoscopic examination reveals clear canals, tympanic membranes are intact bilaterally without bulging, retraction, inflammation or discharge. Hearing is grossly normal bilaterally. Nose:  External nasal examination shows no deformity or inflammation. Nasal mucosa are pink and moist without lesions or exudates. Mouth:  Oral mucosa and oropharynx without lesions or exudates.  Teeth in good repair. Neck:  No deformities, masses, or tenderness noted. Chest Wall:  No deformities, masses, tenderness or gynecomastia noted. sternotomy scar Breasts:  No masses or gynecomastia noted Lungs:  Normal respiratory effort, chest expands symmetrically. Lungs are clear to auscultation, no crackles or wheezes. Heart:  Normal rate and regular rhythm. S1 and S2 normal without gallop, murmur, click, rub or other extra sounds. Abdomen:  Bowel sounds positive,abdomen soft and non-tender without masses, organomegaly or hernias noted. Rectal:  No external abnormalities noted. Normal sphincter tone. No rectal masses or tenderness. Genitalia:  Testes bilaterally descended without nodularity, tenderness or masses. No scrotal masses or lesions. No penis lesions or urethral discharge. Prostate:  2+ enlarged.  2+ enlarged.   Msk:  No deformity or scoliosis noted of thoracic or lumbar spine.   Pulses:  posterior tibial  pulses are not easily palpable dorsalis pedis pulses are full Extremities:   No clubbing, cyanosis, edema, or deformity noted with normal full range of motion of all joints.   Neurologic:  No cranial nerve deficits noted. Station and gait are normal. Plantar reflexes are down-going bilaterally. DTRs are symmetrical throughout. Sensory, motor and coordinative functions appear intact.   Impression & Recommendations:  Problem # 1:  PREVENTIVE HEALTH CARE (ICD-V70.0)  rheumatology follow-up as scheduled.  Will check a sedimentation rate, and also hold Crestor.  His symptoms are not improved with discontinuation of Crestor will get a 10 day trial  of prednisone   Orders: Medicare -1st Annual Wellness Visit [phone])  Problem # 2:  OSTEOARTHRITIS (ICD-715.90)  His updated medication list for this problem includes:    Aspirin 81 Mg Tbec (Aspirin) .Marland Kitchen... Take 1 tablet by mouth once a day    Celebrex 200 Mg Caps (Celecoxib) .Marland Kitchen... 1 two times a day    Tramadol Hcl 50 Mg Tabs (Tramadol hcl) ..... One every 6 hours as needed for pain  His updated medication list for this problem includes:    Aspirin 81 Mg Tbec (Aspirin) .Marland Kitchen... Take 1 tablet by mouth once a day    Celebrex 200 Mg Caps (Celecoxib) .Marland Kitchen... 1 two times a day    Tramadol Hcl 50 Mg Tabs (Tramadol hcl) ..... One every 6 hours as needed for pain  Orders: Venipuncture (44034) TLB-BMP (Basic Metabolic Panel-BMET) (80048-METABOL) TLB-CBC Platelet - w/Differential (85025-CBCD)  TLB-Hepatic/Liver Function Pnl (80076-HEPATIC) TLB-PSA (Prostate Specific Antigen) (84153-PSA) Specimen Handling (16109)  Problem # 3:  HYPERTENSION (ICD-401.9)  His updated medication list for this problem includes:    Benazepril Hcl 10 Mg Tabs (Benazepril hcl) ..... One daily    His updated medication list for this problem includes:    Benazepril Hcl 10 Mg Tabs (Benazepril hcl) ..... One daily  Orders: Venipuncture (60454) TLB-BMP (Basic Metabolic Panel-BMET) (80048-METABOL) TLB-CBC Platelet - w/Differential  (85025-CBCD) TLB-Hepatic/Liver Function Pnl (80076-HEPATIC) Specimen Handling (09811)  Problem # 4:  HYPERLIPIDEMIA (ICD-272.4)  His updated medication list for this problem includes:    Crestor 10 Mg Tabs (Rosuvastatin calcium) .Marland Kitchen... 1 twice weekly    His updated medication list for this problem includes:    Crestor 10 Mg Tabs (Rosuvastatin calcium) .Marland Kitchen... 1 twice weekly  Orders: Venipuncture (91478) TLB-Lipid Panel (80061-LIPID) TLB-BMP (Basic Metabolic Panel-BMET) (80048-METABOL) TLB-CBC Platelet - w/Differential (85025-CBCD) TLB-Hepatic/Liver Function Pnl (80076-HEPATIC) TLB-TSH (Thyroid Stimulating Hormone) (84443-TSH) Specimen Handling (29562)  Complete Medication List: 1)  Aspirin 81 Mg Tbec (Aspirin) .... Take 1 tablet by mouth once a day 2)  Celebrex 200 Mg Caps (Celecoxib) .Marland Kitchen.. 1 two times a day 3)  Claritin 10 Mg Tabs (Loratadine) .... Take 1 tablet by mouth once a day 4)  Nexium 40 Mg Cpdr (Esomeprazole magnesium) .Marland Kitchen.. 1 twice daily 5)  Plavix 75 Mg Tabs (Clopidogrel bisulfate) .Marland Kitchen.. 1 once daily 6)  Temazepam 30 Mg Caps (Temazepam) .Marland Kitchen.. 1 at bedtime 7)  Crestor 10 Mg Tabs (Rosuvastatin calcium) .Marland Kitchen.. 1 twice weekly 8)  Desoximetasone 0.05 % Gel (Desoximetasone) .... Used twice daily as needed 9)  Preservision/lutein Caps (Multiple vitamins-minerals) .... Take 1 tablet by mouth two times a day 10)  Glucosamine-chondroitin Tabs (Glucosamine-chondroit-vit c-mn) .... Take 1 tablet by mouth once a day 11)  Fish Oil 1200 Mg Caps (Omega-3 fatty acids) .... Take 3 capsules by mouth once daily 12)  Benazepril Hcl 10 Mg Tabs (Benazepril hcl) .... One daily 13)  Tramadol Hcl 50 Mg Tabs (Tramadol hcl) .... One every 6 hours as needed for pain 14)  Prednisone 10 Mg Tabs (Prednisone) .... One twice daily 15)  Trazodone Hcl 100 Mg Tabs (Trazodone hcl) .... One one at bedtime 16)  Hydrocodone-homatropine 5-1.5 Mg/17ml Syrp (Hydrocodone-homatropine) .... One tsp every 6 hours for  cough  Other Orders: TLB-Sedimentation Rate (ESR) (85652-ESR)  Patient Instructions: 1)  rheumatology follow-up as scheduled 2)  Please schedule a follow-up appointment in 1  months. 3)  Limit your Sodium (Salt) to less than 2 grams a day(slightly less than 1/2 a teaspoon) to prevent fluid retention, swelling, or worsening of symptoms. 4)  It is important that you exercise regularly at least 20 minutes 5 times a week. If you develop chest pain, have severe difficulty breathing, or feel very tired , stop exercising immediately and seek medical attention. Prescriptions: HYDROCODONE-HOMATROPINE 5-1.5 MG/5ML SYRP (HYDROCODONE-HOMATROPINE) one tsp every 6 hours for cough  #6 oz x 1   Entered and Authorized by:   Gordy Savers  MD   Signed by:   Gordy Savers  MD on 10/08/2010   Method used:   Print then Give to Patient   RxID:   [id number] TRAZODONE HCL 100 MG TABS (TRAZODONE HCL) one one at bedtime  #90 x 2   Entered and Authorized by:   Gordy Savers  MD   Signed by:   Gordy Savers  MD on 10/08/2010   Method used:   Print then  Give to Patient   RxID:   [id number] PREDNISONE 10 MG TABS (PREDNISONE) one twice daily  #20 x 0   Entered and Authorized by:   Gordy Savers  MD   Signed by:   Gordy Savers  MD on 10/08/2010   Method used:   Print then Give to Patient   RxID:   [id number] TRAMADOL HCL 50 MG TABS (TRAMADOL HCL) one every 6 hours as needed for pain  #90 x 2   Entered and Authorized by:   Gordy Savers  MD   Signed by:   Gordy Savers  MD on 10/08/2010   Method used:   Print then Give to Patient   RxID:   [id number] BENAZEPRIL HCL 10 MG TABS (BENAZEPRIL HCL) one daily  #90 x 6   Entered and Authorized by:   Gordy Savers  MD   Signed by:   Gordy Savers  MD on 10/08/2010   Method used:   Print then Give to Patient   RxID:   [id number] TEMAZEPAM 30 MG CAPS (TEMAZEPAM) 1 at bedtime  #50 x  4   Entered and Authorized by:   Gordy Savers  MD   Signed by:   Gordy Savers  MD on 10/08/2010   Method used:   Print then Give to Patient   RxID:   [id number] PLAVIX 75 MG TABS (CLOPIDOGREL BISULFATE) 1 once daily  #90 x 6   Entered and Authorized by:   Gordy Savers  MD   Signed by:   Gordy Savers  MD on 10/08/2010   Method used:   Print then Give to Patient   RxID:   [id number] NEXIUM 40 MG CPDR (ESOMEPRAZOLE MAGNESIUM) 1 twice daily  #60 x 5   Entered and Authorized by:   Gordy Savers  MD   Signed by:   Gordy Savers  MD on 10/08/2010   Method used:   Print then Give to Patient   RxID:   [id number] CELEBREX 200 MG CAPS (CELECOXIB) 1 two times a day  #180 x 6   Entered and Authorized by:   Gordy Savers  MD   Signed by:   Gordy Savers  MD on 10/08/2010   Method used:   Print then Give to Patient   RxID:   [id number]    Orders Added: 1)  Medicare -1st Annual Wellness Visit [G0438] 2)  Venipuncture [26948] 3)  TLB-Lipid Panel [80061-LIPID] 4)  TLB-BMP (Basic Metabolic Panel-BMET) [80048-METABOL] 5)  TLB-CBC Platelet - w/Differential [85025-CBCD] 6)  TLB-Hepatic/Liver Function Pnl [80076-HEPATIC] 7)  TLB-TSH (Thyroid Stimulating Hormone) [84443-TSH] 8)  TLB-PSA (Prostate Specific Antigen) [54627-OJJ] 9)  TLB-Sedimentation Rate (ESR) [85652-ESR] 10)  Specimen Handling [99000]   Immunization History:  Tetanus/Td Immunization History:    Tetanus/Td:  Tdap (06/26/2010)  Influenza Immunization History:    Influenza:  Historical (09/29/2010)   Immunization History:  Influenza Immunization History:    Influenza:  Historical (09/29/2010)

## 2010-12-29 NOTE — Assessment & Plan Note (Signed)
Summary: 6 month fup//ccm   Vital Signs:  Patient profile:   77 year old male Weight:      182 pounds Temp:     97.9 degrees F oral BP sitting:   110 / 70  (left arm) Cuff size:   regular  Vitals Entered By: Duard Brady LPN (Apr 06, 1609 10:24 AM) CC: 6 mos rov - doing well Is Patient Diabetic? No   Primary Care Provider:  Beverely Low, MD  CC:  6 mos rov - doing well.  History of Present Illness: 77 year old patient who is in today for medical follow-up.  He has coronary artery disease, dyslipidemia, and hypertension.  He has done quite well.  He does have osteoarthritis and is scheduled for cervical spinal injection in the near future.  His main complaint is some fairly generalized pain that bothers him mostly at night.  Is concerned about a possible side effect of Crestor.  He denies any cardiopulmonary complaints.  Allergies (verified): No Known Drug Allergies  Past History:  Past Medical History: Reviewed history from 04/03/2009 and no changes required. Coronary artery disease GERD stricture formation Hyperlipidemia Hypertension Peptic ulcer disease Allergic rhinitis DJD Osteoarthritis  Past Surgical History: Reviewed history from 08/18/2009 and no changes required. Coronary artery bypass graft 1999 Cardiac Stent placement-1987, 1998 Cataract extraction-bilaterally   cardiac catheterization February 2009 EGD 2007 colonoscopy 2004  Family History: Reviewed history from 08/18/2009 and no changes required. father died age 59 mother died age 62, MI, hypertension 6 brothers 3 sisters, positive for  lung and prostate cancer, coronary artery disease Family History of Prostate Cancer: Brother No FH of Colon Cancer: Family History of Heart Disease: Mother  Social History: Reviewed history from 08/18/2009 and no changes required. Retired Married Patient is a former smoker. -stopped 68 Alcohol Use - yes-4 drinks daily Daily Caffeine Use-1 cup  daily Illicit Drug Use - no  Review of Systems  The patient denies anorexia, fever, weight loss, weight gain, vision loss, decreased hearing, hoarseness, chest pain, syncope, dyspnea on exertion, peripheral edema, prolonged cough, headaches, hemoptysis, abdominal pain, melena, hematochezia, severe indigestion/heartburn, hematuria, incontinence, genital sores, muscle weakness, suspicious skin lesions, transient blindness, difficulty walking, depression, unusual weight change, abnormal bleeding, enlarged lymph nodes, angioedema, breast masses, and testicular masses.    Physical Exam  General:  Well-developed,well-nourished,in no acute distress; alert,appropriate and cooperative throughout examination;  104/64 Head:  Normocephalic and atraumatic without obvious abnormalities. No apparent alopecia or balding. Eyes:  No corneal or conjunctival inflammation noted. EOMI. Perrla. Funduscopic exam benign, without hemorrhages, exudates or papilledema. Vision grossly normal. Ears:  External ear exam shows no significant lesions or deformities.  Otoscopic examination reveals clear canals, tympanic membranes are intact bilaterally without bulging, retraction, inflammation or discharge. Hearing is grossly normal bilaterally. Mouth:  Oral mucosa and oropharynx without lesions or exudates.  Teeth in good repair. Neck:  No deformities, masses, or tenderness noted. Lungs:  Normal respiratory effort, chest expands symmetrically. Lungs are clear to auscultation, no crackles or wheezes. Heart:  Normal rate and regular rhythm. S1 and S2 normal without gallop, murmur, click, rub or other extra sounds. Abdomen:  Bowel sounds positive,abdomen soft and non-tender without masses, organomegaly or hernias noted. Msk:  No deformity or scoliosis noted of thoracic or lumbar spine.   Extremities:  No clubbing, cyanosis, edema, or deformity noted with normal full range of motion of all joints.     Impression &  Recommendations:  Problem # 1:  OSTEOARTHRITIS (ICD-715.90)  His  updated medication list for this problem includes:    Aspirin 81 Mg Tbec (Aspirin) .Marland Kitchen... Take 1 tablet by mouth once a day    Celebrex 200 Mg Caps (Celecoxib) .Marland Kitchen... 1 two times a day  His updated medication list for this problem includes:    Aspirin 81 Mg Tbec (Aspirin) .Marland Kitchen... Take 1 tablet by mouth once a day    Celebrex 200 Mg Caps (Celecoxib) .Marland Kitchen... 1 two times a day  Problem # 2:  HYPERTENSION (ICD-401.9)  His updated medication list for this problem includes:    Benazepril Hcl 20 Mg Tabs (Benazepril hcl) .Marland Kitchen... 1 once daily  His updated medication list for this problem includes:    Benazepril Hcl 20 Mg Tabs (Benazepril hcl) .Marland Kitchen... 1 once daily  Orders: Prescription Created Electronically [phone])  Problem # 3:  HYPERLIPIDEMIA (ICD-272.4)  His updated medication list for this problem includes:    Crestor 10 Mg Tabs (Rosuvastatin calcium) .Marland Kitchen... 1 once daily will hold Crestor for a 3 or 4 weeks to see if this does not effect his nocturnal pain  His updated medication list for this problem includes:    Crestor 10 Mg Tabs (Rosuvastatin calcium) .Marland Kitchen... 1 once daily  Orders: Prescription Created Electronically [phone])  Problem # 4:  CORONARY ARTERY DISEASE (ICD-414.00)  His updated medication list for this problem includes:    Aspirin 81 Mg Tbec (Aspirin) .Marland Kitchen... Take 1 tablet by mouth once a day    Benazepril Hcl 20 Mg Tabs (Benazepril hcl) .Marland Kitchen... 1 once daily    Plavix 75 Mg Tabs (Clopidogrel bisulfate) .Marland Kitchen... 1 once daily  His updated medication list for this problem includes:    Aspirin 81 Mg Tbec (Aspirin) .Marland Kitchen... Take 1 tablet by mouth once a day    Benazepril Hcl 20 Mg Tabs (Benazepril hcl) .Marland Kitchen... 1 once daily    Plavix 75 Mg Tabs (Clopidogrel bisulfate) .Marland Kitchen... 1 once daily  Orders: Prescription Created Electronically [phone])  Complete Medication List: 1)  Aspirin 81 Mg Tbec (Aspirin) .... Take 1 tablet by  mouth once a day 2)  Benazepril Hcl 20 Mg Tabs (Benazepril hcl) .Marland Kitchen.. 1 once daily 3)  Celebrex 200 Mg Caps (Celecoxib) .Marland Kitchen.. 1 two times a day 4)  Claritin 10 Mg Tabs (Loratadine) .... Take 1 tablet by mouth once a day 5)  Nexium 40 Mg Cpdr (Esomeprazole magnesium) .Marland Kitchen.. 1 twice daily 6)  Plavix 75 Mg Tabs (Clopidogrel bisulfate) .Marland Kitchen.. 1 once daily 7)  Temazepam 30 Mg Caps (Temazepam) .Marland Kitchen.. 1 at bedtime 8)  Crestor 10 Mg Tabs (Rosuvastatin calcium) .Marland Kitchen.. 1 once daily 9)  Desoximetasone 0.05 % Gel (Desoximetasone) .... Used twice daily as needed 10)  Preservision/lutein Caps (Multiple vitamins-minerals) .... Take 1 tablet by mouth two times a day 11)  Glucosamine-chondroitin Tabs (Glucosamine-chondroit-vit c-mn) .... Take 1 tablet by mouth once a day 12)  Fish Oil 1200 Mg Caps (Omega-3 fatty acids) .... Take 3 capsules by mouth once daily  Patient Instructions: 1)  Please schedule a follow-up appointment in 6 months. 2)  Advised not to eat any food or drink any liquids after 10 PM the night before your procedure. 3)  Limit your Sodium (Salt) to less than 2 grams a day(slightly less than 1/2 a teaspoon) to prevent fluid retention, swelling, or worsening of symptoms. 4)  It is important that you exercise regularly at least 20 minutes 5 times a week. If you develop chest pain, have severe difficulty breathing, or feel very tired , stop exercising immediately  and seek medical attention. Prescriptions: CRESTOR 10 MG TABS (ROSUVASTATIN CALCIUM) 1 once daily  #90 x 4   Entered and Authorized by:   Gordy Savers  MD   Signed by:   Gordy Savers  MD on 04/06/2010   Method used:   [facility] to        [facility] [phone]* (retail)       [address]       [place], [place]  [address]       Ph: [phone] or [phone]       Fax: [phone]   RxID:   [id number] PLAVIX 75 MG TABS (CLOPIDOGREL BISULFATE) 1 once daily  #90 x 6   Entered and  Authorized by:   Gordy Savers  MD   Signed by:   Gordy Savers  MD on 04/06/2010   Method used:   Electronically to        [facility] [phone]* (retail)       [address]       [place], [place]  [address]       Ph: [phone] or [phone]       Fax: [phone]   RxID:   [id number] NEXIUM 40 MG CPDR (ESOMEPRAZOLE MAGNESIUM) 1 twice daily  #60 x 5   Entered and Authorized by:   Gordy Savers  MD   Signed by:   Gordy Savers  MD on 04/06/2010   Method used:   Electronically to        [facility] [phone]* (retail)       [address]       [place], [place]  [address]       Ph: [phone] or [phone]       Fax: [phone]   RxID:   [id number] CELEBREX 200 MG CAPS (CELECOXIB) 1 two times a day  #180 x 6   Entered and Authorized by:   Gordy Savers  MD   Signed by:   Gordy Savers  MD on 04/06/2010   Method used:   Electronically to        [facility] [phone]* (retail)       [address]       [place], [place]  [address]       Ph: [phone] or [phone]       Fax: [phone]   RxID:   [id number] BENAZEPRIL HCL 20 MG TABS (BENAZEPRIL HCL) 1 once daily  #90 x 6   Entered and Authorized by:   Gordy Savers  MD   Signed by:   Gordy Savers  MD on 04/06/2010   Method used:   Electronically to        [facility] [phone]* (retail)       [address]       [place], [place]  [address]       Ph: [phone] or [phone]       Fax: [phone]   RxID:   [id number] CRESTOR 10 MG TABS (ROSUVASTATIN CALCIUM) 1 once daily  #90 x 4   Entered and Authorized by:   Gordy Savers  MD  Signed by:   Gordy Savers  MD on 04/06/2010   Method used:   Print then Give to Patient   RxID:   [id number] TEMAZEPAM 30 MG CAPS (TEMAZEPAM) 1 at bedtime  #50 x 4   Entered and  Authorized by:   Gordy Savers  MD   Signed by:   Gordy Savers  MD on 04/06/2010   Method used:   Print then Give to Patient   RxID:   [id number] PLAVIX 75 MG TABS (CLOPIDOGREL BISULFATE) 1 once daily  #90 x 6   Entered and Authorized by:   Gordy Savers  MD   Signed by:   Gordy Savers  MD on 04/06/2010   Method used:   Print then Give to Patient   RxID:   [id number] CLARITIN 10 MG TABS (LORATADINE) Take 1 tablet by mouth once a day  #90 x 6   Entered and Authorized by:   Gordy Savers  MD   Signed by:   Gordy Savers  MD on 04/06/2010   Method used:   Print then Give to Patient   RxID:   [id number] NEXIUM 40 MG CPDR (ESOMEPRAZOLE MAGNESIUM) 1 twice daily  #60 x 5   Entered and Authorized by:   Gordy Savers  MD   Signed by:   Gordy Savers  MD on 04/06/2010   Method used:   Print then Give to Patient   RxID:   [id number] CELEBREX 200 MG CAPS (CELECOXIB) 1 two times a day  #180 x 6   Entered and Authorized by:   Gordy Savers  MD   Signed by:   Gordy Savers  MD on 04/06/2010   Method used:   Print then Give to Patient   RxID:   [id number] BENAZEPRIL HCL 20 MG TABS (BENAZEPRIL HCL) 1 once daily  #90 x 6   Entered and Authorized by:   Gordy Savers  MD   Signed by:   Gordy Savers  MD on 04/06/2010   Method used:   Print then Give to Patient   RxID:   [id number]

## 2010-12-29 NOTE — Assessment & Plan Note (Signed)
Summary: L HAND INJURY (LACERATION ON 7/26) - TETANUS UTD? // RS   Vital Signs:  Patient profile:   77 year old male Weight:      169 pounds Temp:     98.0 degrees F oral BP sitting:   100 / 60  (right arm) Cuff size:   regular  Vitals Entered By: Duard Brady LPN (June 26, 2010 2:28 PM) CC: cut to (L) index finger Is Patient Diabetic? No   Primary Care Provider:  Beverely Low, MD  CC:  cut to (L) index finger.  History of Present Illness: a 77 year old patient, on chronic aspirin and Plavix for coronary artery disease, who cut his left index finger two days ago.  There is dense and persistent bleeding from the laceration site.  Otherwise, is doing quite well.  Has a history of treated hypertension and dyslipidemia  Allergies (verified): No Known Drug Allergies  Physical Exam  General:  Well-developed,well-nourished,in no acute distress; alert,appropriate and cooperative throughout examination Skin:  1.5-cm clean laceration distal left index finger.  There is no bleeding immediately after the dressing was removed.  The laceration with well approximated.  No signs of infection.  No drainage   Impression & Recommendations:  Problem # 1:  LACERATION OF FINGER (ICD-883.0) the area was cleaned.  Antibiotic ointment applied, and dressed.  A tetanus shot given  Complete Medication List: 1)  Aspirin 81 Mg Tbec (Aspirin) .... Take 1 tablet by mouth once a day 2)  Celebrex 200 Mg Caps (Celecoxib) .Marland Kitchen.. 1 two times a day 3)  Claritin 10 Mg Tabs (Loratadine) .... Take 1 tablet by mouth once a day 4)  Nexium 40 Mg Cpdr (Esomeprazole magnesium) .Marland Kitchen.. 1 twice daily 5)  Plavix 75 Mg Tabs (Clopidogrel bisulfate) .Marland Kitchen.. 1 once daily 6)  Temazepam 30 Mg Caps (Temazepam) .Marland Kitchen.. 1 at bedtime 7)  Crestor 10 Mg Tabs (Rosuvastatin calcium) .Marland Kitchen.. 1 twice weekly 8)  Desoximetasone 0.05 % Gel (Desoximetasone) .... Used twice daily as needed 9)  Preservision/lutein Caps (Multiple  vitamins-minerals) .... Take 1 tablet by mouth two times a day 10)  Glucosamine-chondroitin Tabs (Glucosamine-chondroit-vit c-mn) .... Take 1 tablet by mouth once a day 11)  Fish Oil 1200 Mg Caps (Omega-3 fatty acids) .... Take 3 capsules by mouth once daily 12)  Benazepril Hcl 10 Mg Tabs (Benazepril hcl) .... One daily  Patient Instructions: 1)  Please schedule a follow-up appointment in 3 months. 2)  Limit your Sodium (Salt) to less than 2 grams a day(slightly less than 1/2 a teaspoon) to prevent fluid retention, swelling, or worsening of symptoms. 3)  It is important that you exercise regularly at least 20 minutes 5 times a week. If you develop chest pain, have severe difficulty breathing, or feel very tired , stop exercising immediately and seek medical attention. 4)  keep the  laceration clean and dry 5)  call if  there is any worsening pain, redness, or any drainage    Appended Document: L HAND INJURY (LACERATION ON 7/26) - TETANUS UTD? // RS     Allergies: No Known Drug Allergies   Complete Medication List: 1)  Aspirin 81 Mg Tbec (Aspirin) .... Take 1 tablet by mouth once a day 2)  Celebrex 200 Mg Caps (Celecoxib) .Marland Kitchen.. 1 two times a day 3)  Claritin 10 Mg Tabs (Loratadine) .... Take 1 tablet by mouth once a day 4)  Nexium 40 Mg Cpdr (Esomeprazole magnesium) .Marland Kitchen.. 1 twice daily 5)  Plavix 75 Mg Tabs (Clopidogrel bisulfate) .Marland KitchenMarland KitchenMarland Kitchen  1 once daily 6)  Temazepam 30 Mg Caps (Temazepam) .Marland Kitchen.. 1 at bedtime 7)  Crestor 10 Mg Tabs (Rosuvastatin calcium) .Marland Kitchen.. 1 twice weekly 8)  Desoximetasone 0.05 % Gel (Desoximetasone) .... Used twice daily as needed 9)  Preservision/lutein Caps (Multiple vitamins-minerals) .... Take 1 tablet by mouth two times a day 10)  Glucosamine-chondroitin Tabs (Glucosamine-chondroit-vit c-mn) .... Take 1 tablet by mouth once a day 11)  Fish Oil 1200 Mg Caps (Omega-3 fatty acids) .... Take 3 capsules by mouth once daily 12)  Benazepril Hcl 10 Mg Tabs (Benazepril hcl)  .... One daily  Other Orders: Tdap => 76yrs IM (40981) Admin 1st Vaccine (19147)    Immunizations Administered:  Tetanus Vaccine:    Vaccine Type: Tdap    Site: right deltoid    Mfr: [employer]oSmith[employer]    Dose: 0.5 ml    Route: IM    Given by: Duard Brady LPN    Exp. Date: 12/18/2011    Lot #: [id number]    VIS given: 10/17/07 version given June 26, 2010.    Physician counseled: yes

## 2010-12-29 NOTE — Progress Notes (Signed)
Summary: low BP  Phone Note Call from Patient   Caller: Spouse Call For: Dakota Savers  MD Summary of Call: Pt has been feeling weak the last few days.  BP running 90/48 and pulse of 52.  Is dizzy, and wants to know if there should be some changes to his BP meds or does he need to be seen.   [phone] Initial call taken by: Lynann Beaver CMA,  May 14, 2010 10:53 AM  Follow-up for Phone Call        discontinue the benazapril; return office visit next week Follow-up by: Dakota Savers  MD,  May 14, 2010 12:58 PM  Additional Follow-up for Phone Call Additional follow up Details #1::        Notified pt and appt scheduled. Additional Follow-up by: Lynann Beaver CMA,  May 14, 2010 1:07 PM

## 2010-12-29 NOTE — Progress Notes (Signed)
Summary: rhu referral  Phone Note Call from Patient Call back at Home Phone [phone]   Caller: Spouse Call For: Dakota Savers  MD Reason for Call: Acute Illness Summary of Call: Spouse is asking for referral to [facility] rheumtologist [phone] Initial call taken by: Heron Sabins,  September 17, 2010 10:27 AM  Follow-up for Phone Call        ok Follow-up by: Dakota Savers  MD,  September 17, 2010 11:03 AM  Additional Follow-up for Phone Call Additional follow up Details #1::        needs dx Additional Follow-up by: Heron Sabins,  September 17, 2010 11:19 AM    Additional Follow-up for Phone Call Additional follow up Details #2::    degenerative joint diseae and I sent referral to [facility] Follow-up by: Willy Eddy, LPN,  September 17, 2010 11:53 AM

## 2010-12-31 NOTE — Progress Notes (Signed)
Summary: arthritic pain  Phone Note Call from Patient Call back at Home Phone [phone]   Caller: Patient Call For: Gordy Savers  MD Summary of Call: Still having severe arthritic pain since stopping the Celebrex and decreased to 5 mg. to Prednisone. [facility] Initial call taken by: Lynann Beaver CMA AAMA,  November 13, 2010 11:16 AM  Follow-up for Phone Call        ok to  increase prednisone to 5 mg twice daily  Follow-up by: Gordy Savers  MD,  November 13, 2010 12:23 PM    New/Updated Medications: PREDNISONE 5 MG TABS (PREDNISONE) one by mouth bid   Notified pt,.

## 2010-12-31 NOTE — Progress Notes (Signed)
Summary: refill prednisone  Phone Note Call from Patient Call back at Home Phone [phone]   Caller: Spouse Call For: Dakota Savers  MD Summary of Call: Pt has been taking 10 mg Prednisone daiy and needs a new prescription sent to [facility]).   He had previously been taking 5 mg so he has run out early. Initial call taken by: Lynann Beaver CMA AAMA,  December 14, 2010 9:23 AM  Follow-up for Phone Call        10mg   #30 Follow-up by: Dakota Savers  MD,  December 14, 2010 12:38 PM    New/Updated Medications: PREDNISONE 10 MG TABS (PREDNISONE) qd Prescriptions: PREDNISONE 10 MG TABS (PREDNISONE) qd  #30 x 0   Entered by:   Duard Brady LPN   Authorized by:   Dakota Savers  MD   Signed by:   Duard Brady LPN on 09/81/1914   Method used:   Electronically to        [facility] [phone]* (retail)       [address]       [place], [place]  [address]       Ph: [phone] or [phone]       Fax: [phone]   RxID:   [id number]

## 2011-01-13 ENCOUNTER — Other Ambulatory Visit: Payer: Self-pay | Admitting: Dermatology

## 2011-01-14 ENCOUNTER — Telehealth: Payer: Self-pay | Admitting: Internal Medicine

## 2011-01-14 DIAGNOSIS — M2550 Pain in unspecified joint: Secondary | ICD-10-CM

## 2011-01-14 MED ORDER — PREDNISONE 5 MG PO TABS: 7.5000 mg | ORAL_TABLET | Freq: Every day | ORAL | Status: DC

## 2011-01-14 NOTE — Telephone Encounter (Signed)
[facility] 

## 2011-01-14 NOTE — Telephone Encounter (Signed)
Med added to list - rx efilled to [facility]

## 2011-01-14 NOTE — Telephone Encounter (Signed)
Attempt to call cell # - VM - need to stay on 7.5mg  until office visit. Call with pharmacy name and location - and I will call in rx . KIK

## 2011-01-14 NOTE — Telephone Encounter (Signed)
Prednisone 7.5 mg daily until nest ROV- please call in new Rx

## 2011-01-14 NOTE — Telephone Encounter (Signed)
Triage vm----Dr K had him on prednisone for arthritis pain until his 12-30-2010 @ [facility]. The doctors there are sending the patient back to Dr Kirtland Bouchard for treatment. They weren't able to help him. Are almost out of prednisone 10mg  . They suggested weaning him off the prednisone and return to gradually to Celebrex slowly. Please advise. He sees Dr Kirtland Bouchard on 02-05-2011.    Cell -[phone]

## 2011-01-14 NOTE — Telephone Encounter (Signed)
Please advise 

## 2011-01-29 ENCOUNTER — Encounter: Payer: Self-pay | Admitting: Cardiovascular Disease

## 2011-01-29 ENCOUNTER — Ambulatory Visit (INDEPENDENT_AMBULATORY_CARE_PROVIDER_SITE_OTHER): Payer: Medicare Other | Admitting: Cardiovascular Disease

## 2011-01-29 DIAGNOSIS — E7800 Pure hypercholesterolemia, unspecified: Secondary | ICD-10-CM

## 2011-01-29 DIAGNOSIS — Z9861 Coronary angioplasty status: Secondary | ICD-10-CM

## 2011-01-29 NOTE — Progress Notes (Signed)
This encounter was created in error - please disregard.

## 2011-02-03 ENCOUNTER — Encounter: Payer: Self-pay | Admitting: Internal Medicine

## 2011-02-05 ENCOUNTER — Encounter: Payer: Self-pay | Admitting: Internal Medicine

## 2011-02-05 ENCOUNTER — Ambulatory Visit (INDEPENDENT_AMBULATORY_CARE_PROVIDER_SITE_OTHER): Payer: Medicare Other | Admitting: Internal Medicine

## 2011-02-05 DIAGNOSIS — I2510 Atherosclerotic heart disease of native coronary artery without angina pectoris: Secondary | ICD-10-CM

## 2011-02-05 DIAGNOSIS — M1990 Unspecified osteoarthritis, unspecified site: Secondary | ICD-10-CM

## 2011-02-05 MED ORDER — PREDNISONE 10 MG PO TABS: ORAL_TABLET | ORAL | Status: DC

## 2011-02-05 NOTE — Progress Notes (Signed)
  Subjective:    Patient ID: Dakota Green, male    DOB: 1933/09/26, 77 y.o.   MRN: [id number]  HPI   77 year old patient who is seen today for followup. Since August he has had several bouts of fairly generalized pain and stiffness. These have responded very quickly to oral prednisone and he was self-referred to [facility] for an appointment on February 1. Unfortunately consult was not very helpful either with a differential or treatment recommendations. 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 For the past 3 weeks he has also been on Celebrex. He does quite well at present  And is on 7.5 mg every morning. He's had some low back and right hip pain but presently are stable. Radiographs of the right hip revealed no significant arthritic changes. He has had a lumbar MRI recently but the results are pending. His right hip pain has largely resolved.   Review of Systems  Constitutional: Negative for fever, chills, appetite change and fatigue.  HENT: Negative for hearing loss, ear pain, congestion, sore throat, trouble swallowing, neck stiffness, dental problem, voice change and tinnitus.   Eyes: Negative for pain, discharge and visual disturbance.  Respiratory: Negative for cough, chest tightness, wheezing and stridor.   Cardiovascular: Negative for chest pain, palpitations and leg swelling.  Gastrointestinal: Negative for nausea, vomiting, abdominal pain, diarrhea, constipation, blood in stool and abdominal distention.  Genitourinary: Negative for urgency, hematuria, flank pain, discharge, difficulty urinating and genital sores.  Musculoskeletal: Positive for back pain and arthralgias. Negative for myalgias, joint swelling and gait problem.  Skin: Negative for rash.  Neurological: Negative for dizziness, syncope, speech difficulty, weakness, numbness and headaches.  Hematological: Negative for adenopathy. Does not bruise/bleed easily.    Psychiatric/Behavioral: Negative for behavioral problems and dysphoric mood. The patient is not nervous/anxious.        Objective:   Physical Exam  Constitutional: He is oriented to person, place, and time. He appears well-developed.  HENT:  Head: Normocephalic.  Right Ear: External ear normal.  Left Ear: External ear normal.  Eyes: Conjunctivae and EOM are normal.  Neck: Normal range of motion.  Cardiovascular: Normal rate and normal heart sounds.   Pulmonary/Chest: Breath sounds normal.  Abdominal: Bowel sounds are normal.  Musculoskeletal: Normal range of motion. He exhibits no edema and no tenderness.        Patient has fairly modest on stretcher the changes but no signs of the active synovitis  Straight leg testing negative  Range of motion right hip intact  Neurological: He is alert and oriented to person, place, and time.  Psychiatric: He has a normal mood and affect. His behavior is normal.          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 In fact relapsed on 5 mg of prednisone daily. 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 Prednisone has been resumed 3 weeks ago and hopefully this will enable control on a lower dose of prednisone therapy. We'll decrease his prednisone to 5 mg on even days and continue 7.5 mg on all days. Calcium and vitamin D supplements will be added to his regimen. We'll recheck in 3 months and consider a further steroid dose reduction at that time

## 2011-02-05 NOTE — Patient Instructions (Signed)
It is important that you exercise regularly, at least 20 minutes 3 to 4 times per week.  If you develop chest pain or shortness of breath seek  medical attention.  Take a calcium supplement, plus [phone] units of vitamin D  Limit your sodium (Salt) intake  Return in 3 months for follow-up

## 2011-02-25 ENCOUNTER — Encounter: Payer: Self-pay | Admitting: Internal Medicine

## 2011-04-13 NOTE — Cardiovascular Report (Signed)
NAME:  [name], [name] NO.:  [id number]   MEDICAL RECORD NO.:  [id number]          PATIENT TYPE:  OIB   LOCATION:  1965                         FACILITY:  [facility]   PHYSICIAN:  Vesta Mixer, M.D. DATE OF BIRTH:  1933/09/24   DATE OF PROCEDURE:  06/12/2007  DATE OF DISCHARGE:  06/12/2007                            CARDIAC CATHETERIZATION   Dakota Green is a 77 year old gentleman with a history of coronary artery  disease and coronary artery bypass grafting.  He presented to the office  with exertional chest pain.  He was scheduled for heart catheterization  for further evaluation.   PROCEDURE:  Left heart catheterization with coronary angiography.   The right femoral artery was easily cannulated using a modified  Seldinger technique.   HEMODYNAMICS:  LV pressure is 120/14 with an aortic pressure of 118/62.   Angiography of the left main.  The left main may have minor luminal  irregularity but is otherwise free of stenosis.   The left anterior descending artery has a mid 60% stenosis.  The  remaining LAD has minor luminal irregularities.  The diagonal vessel has  minor luminal irregularities.   The left circumflex artery has severe proximal stenosis of around 80%.  Otherwise, the circumflex artery has minor luminal irregularities and  competitive flow can be seen filling the distal circumflex vessel.   The right coronary artery is severely degenerated and is ectatic.  There  is a proximal 70% stenosis and a mid 80-90% stenosis.  There is severe  atherosclerosis throughout the entire proximal mid and distal RCA.   The distal stent has moderate in-stent restenosis of about 40-50%.  The  posterior descending artery has minor luminal irregularities.  Competitive flow can be seen filling via the saphenous vein graft.   The saphenous vein graft to the right coronary artery is a large graft.  The anastomosis is normal.  The posterior descending artery is a  relatively small vessel but is otherwise unremarkable.   The saphenous vein graft to the obtuse marginal vessel is a very large  and normal graft.  The anastomosis to the OM is normal.  The obtuse  marginal has only minor luminal irregularities.   The left internal mammary artery is a relatively small vessel.  It rises  fairly early in the left subclavian vessel.  It reveals no significant  stenosis.  The anastomosis to the LAD is fairly normal.  The LAD is  fairly small.   The left ventriculogram was performed in a 30 RAO position.  It reveals  anterior hypokinesis and basilar inferior wall hypokinesis.  The left  ventricular systolic function is mildly depressed with an ejection  fraction of around 45%.   COMPLICATIONS:  None.   CONCLUSIONS:  Severe native coronary artery disease with patent  saphenous vein grafts to the right coronary artery, to the obtuse  marginal artery and a patent left internal mammary artery to the left  anterior descending.  We will continue with medical therapy.           ______________________________  Vesta Mixer, M.D.  PJN/MEDQ  D:  06/12/2007  T:  06/12/2007  Job:  [id number]

## 2011-04-13 NOTE — H&P (Signed)
NAME:  Dakota, Green NO.:  [id number]   MEDICAL RECORD NO.:  [id number]           PATIENT TYPE:   LOCATION:                                 FACILITY:   PHYSICIAN:  Peter M. Swaziland, M.D.       DATE OF BIRTH:   DATE OF ADMISSION:  DATE OF DISCHARGE:                              HISTORY & PHYSICAL   HISTORY OF PRESENT ILLNESS:  Mr. Dakota Green is a pleasant 77 year old white  male who has known history of coronary artery disease.  He has a remote  history of myocardial infarction with prior stenting of the right  coronary.  He also had prior intervention of the left circumflex  coronary artery.  In 1999 he underwent coronary artery bypass surgery by  Dr. Evelene Croon.  This included a left internal mammary artery graft to  the LAD, saphenous vein graft to obtuse marginal vessel, and a  sequential saphenous vein graft to the PDA and posterolateral branch of  the right coronary artery.  Since that time he has had two cardiac  catheterizations in 2004 and 2006.  These both demonstrated that his  grafts were patent, but he did have moderate native vessel disease and  was treated medically.  Mr. Dakota Green has been very active.  [facility]  He walks for an hour each day with a treadmill at  4 miles per hour up an incline.  He then does stretches and some light  weight training.  He states normally it is very difficult for him to get  his heart rate above 106.  Yesterday he was doing his normal walking  routine when he developed substernal chest pain.  He reduced his work  load and finished out his walk, and he notes the chest pain did resolve  with rest.  This morning he tried to walk again on the treadmill when he  noticed that this heart rate quickly increased to 106 within 5 minutes,  and he again developed a mid-substernal chest pain.  Again, this was  relieved with rest.  These symptoms were similar to prior anginal  symptoms and are clearly a  different symptom for him recently.   PAST MEDICAL HISTORY:  1. History of peptic ulcer disease.  2. Osteoarthritis.  3. History of hyperlipidemia.  4. Coronary history, as noted above.   CURRENT MEDICATIONS:  1. Baby aspirin daily.  2. Nexium 40 mg per day.  3. Celebrex 200 mg per day.  4. Plavix 75 mg daily.  5. Crestor 10 mg daily.  6. Claritin 10 mg daily.  7. Benazepril 20 mg daily.  8. Restoril 30 mg q.h.s.  9. PreserVision one b.i.d.  10.Fish oil 1000 mg t.i.d.   ALLERGIES:  THE PATIENT STATES THAT DR. KWIATKOWSKI HAD RECOMMENDED  INCREASING HIS CRESTOR IN JANUARY TO 20 MG PER DAY, BUT HE HAD INCREASED  COMPLAINTS ABOUT CHEST DISCOMFORT, SO HE REDUCED THIS BACK TO 10 MG PER  DAY.   REVIEW OF SYSTEMS:  Negative.   SOCIAL HISTORY:  The patient  is married.  He has four children.  He is a  nonsmoker and does not drink alcohol.   FAMILY HISTORY:  His father died at age 92 of natural causes.  Mother  died at age 76 with myocardial infarction.  Two brothers have passed  away with cancer.   PHYSICAL EXAMINATION:  GENERAL:  Pleasant white male in no apparent  distress.  VITAL SIGNS:  His weight is 185.  Blood pressure 136/98, pulse is 64 and  regular.  NECK:  He has no jugular venous distension or bruits.  He has no  thyromegaly or adenopathy.  LUNGS:  Clear.  CARDIAC:  Regular rate and rhythm without gallop, murmur, rub, or click.  ABDOMEN:  Soft, nontender, without masses or hepatosplenomegaly.  EXTREMITIES:  Without cyanosis, clubbing, or edema.  He has good pedal  pulses.  NEUROLOGIC:  Nonfocal.   LABORATORY DATA:  His ECG shows normal sinus rhythm with left anterior  fascicular block and no acute ST-T wave changes.  His chest x-ray shows  no active disease.  Other labs are pending at this point.   IMPRESSION:  1. New onset of angina.  2. Extensive history of coronary artery disease status post prior      interventions and subsequent coronary artery bypass  surgery in      1999.  3. Hyperlipidemia.  4. Osteoarthritis.  5. Gastroesophageal reflux disease.   PLAN:  Proceed with diagnostic cardiac catheterization with further  therapy pending these results.           ______________________________  Peter M. Sw[place]il[place], M.D.     PMJ/MEDQ  D:  06/08/2007  T:  06/09/2007  Job:  [id number]   cc:   Vesta Mixer, M.D.  Gordy Savers, MD

## 2011-04-16 NOTE — Op Note (Signed)
   NAME:  Dakota Green, Dakota Green NO.:  [id number]   MEDICAL RECORD NO.:  [id number]                   PATIENT TYPE:  AMB   LOCATION:  ENDO                                 FACILITY:  [facility]   PHYSICIAN:  Georgiana Spinner, M.D.                 DATE OF BIRTH:  10-17-1933   DATE OF PROCEDURE:  DATE OF DISCHARGE:                                 OPERATIVE REPORT   PROCEDURE:  Colonoscopy.   INDICATIONS FOR PROCEDURE:  Colon cancer screening.   ANESTHESIA:  Demerol 80 mg, Versed 8 mg.   PROCEDURE:  With the patient mildly sedated in the left lateral decubitus  position, a rectal examination was performed which was unremarkable.  The  prostate appeared normal.  Subsequently the Olympus videoscopic variable  stiffness colonoscope was inserted into the rectum and passed under direct  vision through a tortuous diverticula-filled colon to the cecum.  The prep  was good.  The patient had taken MiraLax and Gatorade prep.  The cecum was  identified by the identified by ileocecal valve and the base of the cecum,  both of which were photographed.  From this point the colonoscope was then  slowly withdrawn taking circumferential views of the colonic mucosa  visualized, stopping only then in the rectum which appeared normal on direct  and showed small hemorrhoids in retroflex view of the anal canal.  The  endoscope was straightened, withdrawn.  The patient's vital signs and pulse  oximetry remained stable.  The patient tolerated the procedure well without  apparent complications.   FINDINGS:  Diverticula-filled colon with many large bowel diverticula seen  throughout the colon.  The sigmoid colon being more involved than the right  side and internal hemorrhoids were seen as well.   PLAN:  Repeat examination if possibly 5-10 years according to patient's  clinical situation and Medicare guidelines.                                               Georgiana Spinner, M.D.    GMO/MEDQ  D:  07/10/2003  T:  07/10/2003  Job:  [id number]   cc:   Gordy Savers, M.D. [facility]

## 2011-04-16 NOTE — H&P (Signed)
NAME:  Dakota, Green NO.:  [id number]   MEDICAL RECORD NO.:  [id number]          PATIENT TYPE:  AMB   LOCATION:                               FACILITY:  [facility]   PHYSICIAN:  Vesta Mixer, M.D. DATE OF BIRTH:  Sep 11, 1933   DATE OF ADMISSION:  01/18/2005  DATE OF DISCHARGE:                                HISTORY & PHYSICAL   Mr. Dakota Green is a middle-aged gentleman with a history of coronary artery  disease.  He is status post coronary artery bypass grafting.  He also has a  history of hyperlipidemia, peptic ulcer disease, and arthritis.  He has  severe neck arthritis and has received back injections in the past.   This morning, he woke up with episodes of chest pain.  The chest pain was  described as a vague ache in his chest.  He also described some tingling and  numbness in his left hand.  He states that the pain is somewhat different  than his previous episodes of chest pain.  He took a nitroglycerin that was  approximately two years old.  The nitroglycerin did not relieve the pain,  but it should be noted that it was also fairly old and he really did not  feel that it was potent as previous nitroglycerins that he had taken in the  past.   He denies any syncope or presyncope.  His wife noticed that his blood  pressure was fairly elevated today.  He has been following a good diet and  exercise program.  He has been working out on a daily basis.   CURRENT MEDICATIONS:  1.  Aspirin 81 mg a day.  2.  Multivitamin once a day.  3.  Nexium 40 mg a day.  4.  Ambien 1/2 tablet q.h.s.  5.  Lipitor 40 mg a day.  6.  Celebrex 200 mg a day.  7.  Glucosamine once a day.   PHYSICAL EXAMINATION:  GENERAL:  This is a middle-aged gentleman in no acute  distress.  He is alert and oriented x3.  His mood and affect are normal.  VITAL SIGNS:  Weight is 180, blood pressure 140/90 with heart rate of 63.  HEENT:  2+ carotids.  No bruits.  No JVD and no thyromegaly.  LUNGS:   Clear to auscultation.  HEART:  Regular rate and rhythm, S1 and S2 with no murmurs, rubs, or  gallops.  ABDOMEN:  Good bowel sounds and is nontender.  EXTREMITIES:  No cyanosis, clubbing, or edema.  NEUROLOGY:  Nonfocal.   His EKG reveals normal sinus rhythm.  He has nonspecific ST and T wave  abnormalities, but no acute changes.  There is no significant change from  his previous tracing.   Sayge presents with an episode of left arm pain and some chest discomfort.  This certainly could be angina.  He is scheduled to go out of town next  week.  For further evaluation, I would like to proceed with an outpatient  heart  catheterization.  We have discussed the risks, benefits, and options of  heart catheterization.  He understands and agrees to proceed.  I have given  him another prescription for nitroglycerin.  I have asked him to call us  sooner if he has any problems.       ___________________________________________  Vesta Mixer, M.D.    PJN/MEDQ  D:  01/15/2005  T:  01/15/2005  Job:  [id number]   cc:   Gordy Savers, M.D. [facility]

## 2011-04-16 NOTE — Letter (Signed)
September 05, 2006     RE:  Dakota Green, Dakota Green  MRN:  [id number]  /  DOB:  1933-09-11   Dear Dakota Green:   Mr. Dakota Green is a patient followed in our Internal Medicine Practice.  He has a history of severe gastroesophageal reflux disease, which has been  complicated by esophageal strictures.  This has required dilatations in the  past.  He is followed by gastroenterology and also has a history of peptic  ulcer disease. Due to his symptomatic gastroesophageal reflux disease  complicated by stricture formation, [name] will require indefinite  treatment with Nexium 40 mg daily.    Sincerely,     ______________________________  Dakota Savers, MD    PFK/MedQ  /  Job #:  [id number]  DD:  09/05/2006 / DT:  09/07/2006

## 2011-04-16 NOTE — Cardiovascular Report (Signed)
NAME:  Dakota Green, Dakota Green NO.:  [id number]   MEDICAL RECORD NO.:  [id number]          PATIENT TYPE:  OIB   LOCATION:  6501                         FACILITY:  [facility]   PHYSICIAN:  Vesta Mixer, M.D. DATE OF BIRTH:  03/06/1933   DATE OF PROCEDURE:  01/18/2005  DATE OF DISCHARGE:                              CARDIAC CATHETERIZATION   [name] is a 77 year old gentleman with history of coronary artery  disease and coronary artery bypass grafting. He presented to the office with  some episodes of chest pain and chest tightness the other day. He is  scheduled for heart catheterization for further evaluation.   PROCEDURE:  Left heart catheterization with coronary angiography.   The right femoral artery was easily cannulated using modified Seldinger  technique. The catheters were exchanged out over guidewire and exchanged  with guide wire was used to cannulate the left subclavian artery. A no  torque catheter was used to visualize the native right coronary artery.. We  used the no torque catheter to aim up into the left subclavian artery. This  was then exchanged over a long guide wire for an IMA catheter.   HEMODYNAMICS:  LV pressure was 140/12 with an aortic pressure of 139/66.   ANGIOGRAPHY:  Left main:  The left main coronary artery has minor luminal  irregularities.   The left anterior descending artery is moderately and diffusely disease. The  proximal LAD has minor irregularities. The mid LAD has a long 70% stenosis.  There is competitive flow from IMA.   The left circumflex artery is severely diseased. There is 75-80% stenosis in  the proximal and mid segments. The circumflex artery terminates as a small  posterolateral branch.   The right coronary artery was engaged using a no torque catheter. It is very  ectatic. There is 50-60% stenosis at the ostium. This was followed by a 60-  70% stenosis in the proximal segment and then a 70-80% stenosis in the  mid  segment. The remainder of RCA is very ectatic and severely diseased. The  right coronary artery gives off a small posterolateral branch but there is  brisk flow.   The saphenous vein graft to right coronary artery is normal graft. The  anastomosis is normal. The posterior descending artery is normal.   The saphenous vein graft to the obtuse marginal artery is normal. The obtuse  marginal vessel was normal.   The left internal mammary artery was normal. The is anastomosis of the  distal LAD is normal. There is competitive flow from the native LAD.   The left ventriculogram was performed in the 30 RAO position. It reveals  mild left ventricular dilatation. There is overall normal left ventricular  systolic function with an ejection fraction of 50-55%.   COMPLICATIONS:  None.   CONCLUSION:  1.  Severe native coronary disease. He has patent grafts to the right      coronary artery and to the obtuse marginal artery. He has a patent IMA      artery to the LAD. He does have several native coronary arteries that  have moderate disease and could be causing some of his chest pain. None      of these are candidates for angioplasty. Will continue with medical      therapy.      PJN/MEDQ  D:  01/18/2005  T:  01/18/2005  Job:  [id number]

## 2011-04-16 NOTE — H&P (Signed)
NAME:  Dakota Green, Dakota Green NO.:  [id number]   MEDICAL RECORD NO.:  [id number]                   PATIENT TYPE:  EMS   LOCATION:  MAJO                                 FACILITY:  [facility]   PHYSICIAN:  Colleen Can. Deborah Chalk, M.D.            DATE OF BIRTH:  [birth year]   DATE OF ADMISSION:  01/12/2003  DATE OF DISCHARGE:                                HISTORY & PHYSICAL   CHIEF COMPLAINT:  Chest pain of new onset.   HISTORY OF PRESENT ILLNESS:  The patient is a very pleasant 77 year old  white male who has known coronary disease.  He had previous bypass surgery  dating back to 1999.  His last Cardiolite study which was unremarkable was  in 3/02.  He presents to the emergency department today after speaking to  the physician on call.  He had basically done well until this past Wednesday  when he had worked out in [employer] earlier in the day, but then while sitting  at home he began having some chest pain, was subsequently relieved, only to  return on Thursday while he was walking on his treadmill.  He actually  called for a nitroglycerin prescription today, and was subsequently brought  to the emergency room for further evaluation.  He continues to have some  mild chest discomfort upon our examination.   PAST MEDICAL HISTORY:  1. Ischemic heart disease with previous myocardial infarction in 1987.     Angioplasty to the right coronary artery in 1998, with previous coronary     artery bypass grafting x4 by Dr. Evelene Croon in 1999, with left internal     mammary artery to the left anterior descending, sequential vein graft to     the PD and PL, and a vein graft to the obtuse marginally.  He had a     negative Cardiolite in 3/02.  2. Osteoarthritis.  3. Hypercholesterolemia.  4. Gastroesophageal reflux disease/hiatal hernia.  5. History of peptic ulcer disease.   ALLERGIES:  No known drug allergies.   CURRENT MEDICATIONS:  1. Lipitor 20 mg daily.  2. Aspirin  daily.  3. Allegra daily.  4. Multivitamin daily.  5. Ambien at bedtime.  6. Nexium 40 mg daily.  7. Vioxx 25 mg daily.   FAMILY HISTORY:  Father died with a heart attack.  Mother died of old age at  age 33.   SOCIAL HISTORY:  He is married.  He is a remote smoker.   REVIEW OF SYMPTOMS:  He does have some occasional indigestion and belching,  but otherwise review of systems is negative.   PHYSICAL EXAMINATION:  GENERAL:  He is currently awake, alert, complaining  of just mild chest discomfort.  VITAL SIGNS:  Vital signs are stable.  Blood pressure is 127/78, heart rate  is in the 80's, respiratory rate is 20, he is afebrile.  SKIN:  Warm and dry.  Color is unremarkable.  NECK:  Supple, no carotid bruits.  LUNGS:  Clear.  There is no chest wall tenderness.  CARDIAC:  Regular rate and rhythm without murmur.  ABDOMEN:  Soft, positive bowel sounds.  EXTREMITIES:  Without edema.  NEUROLOGIC:  Intact with no gross deficits.   LABORATORY DATA:  Pending.   EKG is unchanged from prior tracings.   IMPRESSION:  1. Chest pain of new onset.  2. Known ischemic cardiac disease with previous bypass surgery in 1999, with     last Cardiolite study in 2002.  3. Known gastroesophageal reflux disease, maintained on chronic proton pump     inhibitor.  4. Hyperlipidemia, on Lipitor.  5. Osteoarthritis, on chronic non-steroidal use.   PLAN:  He will be admitted to the service of Dr. Delane Ginger.  Serial  enzymes will be obtained.  He will be placed on IV nitroglycerin and  heparin.  Home medications will be continued.  We will probably need to  proceed on with cardiac catheterization on Monday.      Juanell Fairly C. Earl Gala, N.P.                 Colleen Can. Deborah Chalk, M.D.    LCO/MEDQ  D:  01/12/2003  T:  01/12/2003  Job:  [id number]   cc:   Vesta Mixer, M.D.  [address]  [place]  [place] [address]  Fax: [phone]   Gordy Savers, M.D. [facility]

## 2011-04-16 NOTE — Assessment & Plan Note (Signed)
[facility] OFFICE NOTE   NAME:Green, Dakota BARRETTO                        MRN:          [id number]  DATE:11/08/2006                            DOB:          01-12-1933    A 77 year old gentleman seen today for an annual exam. He has coronary  artery disease status post CABG in 1999. He has a history of peptic  ulcer disease, hypercholesterolemia, also gastroesophageal reflux  disease with stricture formation. He has DJD and seasonal allergic  rhinitis. He has done well followed by Dr. Elease Hashimoto. He had a negative  Cardiolite in 2002. His last colonoscopy was in 2004.   FAMILY HISTORY:  Unchanged, positive for hypertension, coronary artery  disease, lung and prostate cancer. 5 of 10 siblings are deceased.   PHYSICAL EXAMINATION:  GENERAL:  Healthy-appearing, fit male in no acute  distress.  VITAL SIGNS:  Blood pressure was 160/90.  HEENT:  Fundi, ear, nose and throat clear.  NECK:  No bruits.  CHEST:  Clear.  CARDIOVASCULAR:  Normal heart sounds, no murmurs.  ABDOMEN:  Benign.  EXTREMITIES:  Reveal no edema. Peripheral pulses were full except for an  absent right posterior tibial pulse.  PELVIC:  External genitalia normal.  RECTAL:  Prostate +2 and benign, stool heme negative.   IMPRESSION:  1. Coronary artery disease.  2. Hypertension.  3. Hyperlipidemia.   DISPOSITION:  Will recheck his blood pressure in 6 weeks. Will consider  placing him on an ACE inhibitor at that time if still high. Laboratory  studies reviewed.     Gordy Savers, MD  Electronically Signed    PFK/MedQ  DD: 11/08/2006  DT: 11/08/2006  Job #: [id number]

## 2011-04-16 NOTE — Op Note (Signed)
NAME:  Dakota Green, Dakota Green NO.:  [id number]   MEDICAL RECORD NO.:  [id number]          PATIENT TYPE:  AMB   LOCATION:  ENDO                         FACILITY:  [facility]   PHYSICIAN:  Georgiana Spinner, M.D.    DATE OF BIRTH:  June 04, 1933   DATE OF PROCEDURE:  05/30/2006  DATE OF DISCHARGE:                                 OPERATIVE REPORT   PROCEDURE:  Upper endoscopy.   INDICATIONS:  GERD.   ANESTHESIA:  Demerol 70 mg Versed 7 mg.   DESCRIPTION OF PROCEDURE:  With the patient mildly sedated in the left  lateral decubitus position, the Olympus videoscopic endoscope was inserted  into the mouth, passed under direct vision through the esophagus which  appeared normal, into the stomach; fundus and body appeared normal.  Antrum  showed a small nodule that was photographed and biopsied.  Duodenal bulb and  second portion of the duodenum were visualized.  From this point the  endoscope was slowly withdrawn taking cervix views of the duodenal mucosa  until the endoscope was then pulled back into the stomach, placed in  retroflexion, to view the stomach from below.  The endoscope was then  straightened; and a guidewire was passed, under fluoroscopic control.  The  endoscope was withdrawn.   Subsequently Savary dilators 17 and 18 were passed with no blood seen on  either dilator with the latter.  The guidewire was removed.  The endoscope  was reinserted.  A small amount of blood was seen in the distal esophagus  consistent with successful esophageal dilation.  The endoscope was then  withdrawn.  The patient's vital signs and pulse oximeter remained stable.  The patient tolerated the procedure well without apparent complication.   FINDINGS:  Dilation of the distal esophagus to 17 and 18 Savary dilation;  which is approximately 54-French; and biopsy of antrum nodule.  Will await  biopsy report and clinical response.  The patient will call me for results,  and follow up with me as an  outpatient.           ______________________________  Georgiana Spinner, M.D.     GMO/MEDQ  D:  05/30/2006  T:  05/30/2006  Job:  [id number]   cc:   Gordy Savers, M.D. [facility]  [address]   [place] [address]

## 2011-04-16 NOTE — Cardiovascular Report (Signed)
NAME:  Dakota Green, Dakota Green NO.:  [id number]   MEDICAL RECORD NO.:  [id number]                   PATIENT TYPE:  INP   LOCATION:  3709                                 FACILITY:  [facility]   PHYSICIAN:  Vesta Mixer, M.D.              DATE OF BIRTH:  [birth year]   DATE OF PROCEDURE:  01/14/2003  DATE OF DISCHARGE:                              CARDIAC CATHETERIZATION   INDICATION:  The patient is a 77 year old gentleman with a history of  coronary artery disease -- status PTCA and stenting of his right coronary  artery and status post coronary artery bypass grafting.  He was admitted to  the hospital with four episodes of chest pain, relieved with sublingual  nitroglycerin.  He had negative cardiac enzymes.  He was referred for heart  catheterization for further evaluation.   PROCEDURE:  Left heart catheterization with coronary angiography.   DESCRIPTION OF PROCEDURE:  The right femoral artery was easily cannulated  using a modified Seldinger technique.   HEMODYNAMICS:  The left ventricular pressure was 153/18 with an aortic  pressure of 153/82.   ANGIOGRAPHY:  The left main coronary artery had a 20-30% stenosis at the  ostium.   The left anterior descending artery is moderately calcified.  There are long  diffuse narrowings of between 50% and 55% in the proximal area.  There is  competitive flow from the distal LAD at the site of the IMA graft.  The  first diagonal vessel is moderate-sized and it is unremarkable.   The circumflex artery is severely diseased in the proximal and mid-segments.  The proximal portion is very tortuous and has an 80% stenosis.  There is a  90% stenosis just prior to the obtuse marginal artery.  The distal  circumflex artery has only minor luminal irregularities.  There is some  competitive flow coming up from the obtuse marginal artery that feeds  retrograde down the distal circumflex artery.   The right coronary artery is  very large and ectatic.  It is dominant.  There  is a large area of ectasia in the proximal vessel.  Following this, there is  a 50% stenosis.  The mid-vessel has diffuse 40-50% stenosis.  The distal  right coronary artery is widely patent at the site of the previous stent.  The distal right coronary artery has diffuse irregularities between 30% and  40%.  There is competitive flow visible from the posterior descending artery  and the posterolateral segment artery.   The saphenous vein graft to the right coronary artery is normal.  It  anastomosed to the posterior descending artery and to the posterolateral  segment artery.  Both these anastomoses are normal.  There is brisk flow  down the distal vessel.  The saphenous vein graft to the obtuse marginal  artery is a normal graft.  The anastomosis is normal.  Flow can be seen  going retrograde up the obtuse marginal artery and feeds the distal  circumflex artery.   The left internal mammary artery was cannulated using an IMA graft catheter.  This vessel was widely patent.  The anastomosis to the distal LAD is patent.  There is brisk flow down this vessel.   The left ventriculogram was performed in a 30 RAO position.  It reveals  overall well-preserved left ventricular systolic function.  The ejection  fraction is approximately 55%.  There is no mitral regurgitation.   COMPLICATIONS:  None.   CONCLUSIONS:  Moderate-to-severe native coronary artery disease but with  patent saphenous vein grafts to the obtuse marginal artery, the distal right  coronary artery (two different sites) and to the internal mammary artery  graft to the left anterior descending.  We will continue with medical  therapy.                                               Vesta Mixer, M.D.    PJN/MEDQ  D:  01/14/2003  T:  01/14/2003  Job:  [id number]   cc:   Gordy Savers, M.D. [facility]

## 2011-04-27 ENCOUNTER — Other Ambulatory Visit: Payer: Self-pay

## 2011-04-27 MED ORDER — TEMAZEPAM 30 MG PO CAPS: 30.0000 mg | ORAL_CAPSULE | Freq: Every evening | ORAL | Status: DC | PRN

## 2011-05-07 ENCOUNTER — Ambulatory Visit (INDEPENDENT_AMBULATORY_CARE_PROVIDER_SITE_OTHER): Payer: Medicare Other | Admitting: Internal Medicine

## 2011-05-07 ENCOUNTER — Encounter: Payer: Self-pay | Admitting: Internal Medicine

## 2011-05-07 DIAGNOSIS — I10 Essential (primary) hypertension: Secondary | ICD-10-CM

## 2011-05-07 DIAGNOSIS — I2510 Atherosclerotic heart disease of native coronary artery without angina pectoris: Secondary | ICD-10-CM

## 2011-05-07 DIAGNOSIS — M1990 Unspecified osteoarthritis, unspecified site: Secondary | ICD-10-CM

## 2011-05-07 DIAGNOSIS — E785 Hyperlipidemia, unspecified: Secondary | ICD-10-CM

## 2011-05-07 DIAGNOSIS — M48061 Spinal stenosis, lumbar region without neurogenic claudication: Secondary | ICD-10-CM

## 2011-05-07 DIAGNOSIS — M542 Cervicalgia: Secondary | ICD-10-CM

## 2011-05-07 MED ORDER — PREDNISONE 5 MG PO TABS: 5.0000 mg | ORAL_TABLET | Freq: Every day | ORAL | Status: DC

## 2011-05-07 NOTE — Patient Instructions (Signed)
Limit your sodium (Salt) intake   It is important that you exercise regularly, at least 20 minutes 3 to 4 times per week.  If you develop chest pain or shortness of breath seek  medical attention.  Decrease prednisone to 5 mg daily  Return in 3 months for follow-up

## 2011-05-07 NOTE — Progress Notes (Signed)
  Subjective:    Patient ID: Dakota Green, male    DOB: 09/16/1933, 77 y.o.   MRN: [id number]  HPI 77 year old patient who is seen today for followup. He has a history of inflammatory osteoarthritis and has failed a number of prednisone tapers. Presently he is doing remarkably well on 5 mg of prednisone alternating with 7.5 mg on alternate days. Celebrex has also been added back to his regimen. He feels remarkably well today. He has been seen by orthopedics and did have a recent lumbar MRI that revealed spinal stenosis. At the present time his cervical pain as well as his lumbar and hip pain has been much improved. He has dyslipidemia which has been very well controlled on Crestor 10 mg daily. He has remote history of peptic ulcer disease and is on Nexium gastro-chemoprophylaxis denies any chest pain. He has manageable chronic low back and neck pain only but is generalized severe pain and stiffness has resolved   Review of Systems  Constitutional: Negative for fever, chills, appetite change and fatigue.  HENT: Negative for hearing loss, ear pain, congestion, sore throat, trouble swallowing, neck stiffness, dental problem, voice change and tinnitus.   Eyes: Negative for pain, discharge and visual disturbance.  Respiratory: Negative for cough, chest tightness, wheezing and stridor.   Cardiovascular: Negative for chest pain, palpitations and leg swelling.  Gastrointestinal: Negative for nausea, vomiting, abdominal pain, diarrhea, constipation, blood in stool and abdominal distention.  Genitourinary: Negative for urgency, hematuria, flank pain, discharge, difficulty urinating and genital sores.  Musculoskeletal: Positive for back pain and arthralgias. Negative for myalgias, joint swelling and gait problem.  Skin: Negative for rash.  Neurological: Negative for dizziness, syncope, speech difficulty, weakness, numbness and headaches.  Hematological: Negative for adenopathy. Does not bruise/bleed easily.    Psychiatric/Behavioral: Negative for behavioral problems and dysphoric mood. The patient is not nervous/anxious.        Objective:   Physical Exam  Constitutional: He is oriented to person, place, and time. He appears well-developed.  HENT:  Head: Normocephalic.  Right Ear: External ear normal.  Left Ear: External ear normal.  Eyes: Conjunctivae and EOM are normal.  Neck: Normal range of motion.  Cardiovascular: Normal rate and normal heart sounds.   Pulmonary/Chest: Breath sounds normal.  Abdominal: Bowel sounds are normal.  Musculoskeletal: Normal range of motion. He exhibits no edema and no tenderness.  Neurological: He is alert and oriented to person, place, and time.  Psychiatric: He has a normal mood and affect. His behavior is normal.          Assessment & Plan:   Inflammatory osteoarthritis. We'll give the patient a trial on prednisone 5 mg daily hopefully this will be successful since Celebrex has been added to his daily regimen. If he does not tolerate this lower dose he has been counseled to increase to his present dose of 7.5 alternating with 5 mg every other day Hypercholesterolemia stable Coronary artery disease stable Peptic ulcer disease stable

## 2011-05-23 ENCOUNTER — Other Ambulatory Visit: Payer: Self-pay | Admitting: Internal Medicine

## 2011-06-28 ENCOUNTER — Ambulatory Visit (INDEPENDENT_AMBULATORY_CARE_PROVIDER_SITE_OTHER): Payer: Medicare Other | Admitting: Internal Medicine

## 2011-06-28 ENCOUNTER — Encounter: Payer: Self-pay | Admitting: Internal Medicine

## 2011-06-28 DIAGNOSIS — R509 Fever, unspecified: Secondary | ICD-10-CM

## 2011-06-28 DIAGNOSIS — M1990 Unspecified osteoarthritis, unspecified site: Secondary | ICD-10-CM

## 2011-06-28 DIAGNOSIS — I10 Essential (primary) hypertension: Secondary | ICD-10-CM

## 2011-06-28 NOTE — Progress Notes (Signed)
  Subjective:    Patient ID: Dakota Green, male    DOB: 07-Feb-1933, 77 y.o.   MRN: [id number]  HPI  77 year old patient who is seen today for followup. He was seen at an urgent care approximately 6 weeks ago for a tick bite and a local skin reaction. At that time he was given a prescription for doxycycline to take if he develops fever or a flulike illness. Yesterday he developed fever achiness and a general sense of unwellness he had some associated chills and headaches he started doxycycline yesterday and this morning was already improved. This afternoon he feels that he is back to baseline. He denies any rash    Review of Systems  Constitutional: Positive for fever, chills and fatigue. Negative for diaphoresis and appetite change.  HENT: Negative for hearing loss, ear pain, congestion, sore throat, trouble swallowing, neck stiffness, dental problem, voice change and tinnitus.   Eyes: Negative for pain, discharge and visual disturbance.  Respiratory: Negative for cough, chest tightness, wheezing and stridor.   Cardiovascular: Negative for chest pain, palpitations and leg swelling.  Gastrointestinal: Negative for nausea, vomiting, abdominal pain, diarrhea, constipation, blood in stool and abdominal distention.  Genitourinary: Negative for urgency, hematuria, flank pain, discharge, difficulty urinating and genital sores.  Musculoskeletal: Negative for myalgias, back pain, joint swelling, arthralgias and gait problem.  Skin: Negative for rash.  Neurological: Positive for weakness. Negative for dizziness, syncope, speech difficulty, numbness and headaches.  Hematological: Negative for adenopathy. Does not bruise/bleed easily.  Psychiatric/Behavioral: Negative for behavioral problems and dysphoric mood. The patient is not nervous/anxious.        Objective:   Physical Exam  Constitutional: He is oriented to person, place, and time. He appears well-developed.  HENT:  Head: Normocephalic.  Right  Ear: External ear normal.  Left Ear: External ear normal.  Eyes: Conjunctivae and EOM are normal.  Neck: Normal range of motion.  Cardiovascular: Normal rate and normal heart sounds.   Pulmonary/Chest: Breath sounds normal.  Abdominal: Bowel sounds are normal.  Musculoskeletal: Normal range of motion. He exhibits no edema and no tenderness.  Neurological: He is alert and oriented to person, place, and time.  Psychiatric: He has a normal mood and affect. His behavior is normal.          Assessment & Plan:   No problem-specific assessment & plan notes found for this encounter.  Acute febrile illness in the setting of recent tick bite. We'll continue doxycycline to complete 14 days of therapy. He will report any clinical worsening. Do not feel any cervical os the testing well change therapeutic approach. He will call of there is any clinical deterioration and we'll consider further diagnostic studies if he worsens. His wife uncle has recently passed away at 41 from complications of [facility] spotted fever

## 2011-06-28 NOTE — Patient Instructions (Signed)
Take your antibiotic as prescribed until ALL of it is gone, but stop if you develop a rash, swelling, or any side effects of the medication.  Contact our office as soon as possible if  there are side effects of the medication.  Call or return to clinic prn if these symptoms worsen or fail to improve as anticipated.  

## 2011-07-25 ENCOUNTER — Other Ambulatory Visit: Payer: Self-pay | Admitting: Internal Medicine

## 2011-08-06 ENCOUNTER — Ambulatory Visit (INDEPENDENT_AMBULATORY_CARE_PROVIDER_SITE_OTHER): Payer: Medicare Other | Admitting: Internal Medicine

## 2011-08-06 ENCOUNTER — Encounter: Payer: Self-pay | Admitting: Internal Medicine

## 2011-08-06 DIAGNOSIS — M1990 Unspecified osteoarthritis, unspecified site: Secondary | ICD-10-CM

## 2011-08-06 DIAGNOSIS — I10 Essential (primary) hypertension: Secondary | ICD-10-CM

## 2011-08-06 DIAGNOSIS — I2510 Atherosclerotic heart disease of native coronary artery without angina pectoris: Secondary | ICD-10-CM

## 2011-08-06 DIAGNOSIS — K219 Gastro-esophageal reflux disease without esophagitis: Secondary | ICD-10-CM

## 2011-08-06 MED ORDER — PREDNISONE 5 MG PO TABS: 5.0000 mg | ORAL_TABLET | Freq: Every day | ORAL | Status: DC

## 2011-08-06 NOTE — Patient Instructions (Signed)
Limit your sodium (Salt) intake  Decrease prednisone to 5 mg every morning  Return in 3 months for follow-up

## 2011-08-06 NOTE — Progress Notes (Signed)
  Subjective:    Patient ID: Dakota Green, male    DOB: 1933/03/27, 77 y.o.   MRN: [id number]  HPI  77 year old patient who is seen today for followup. He has a history of steroid responsive osteoarthritis and has done remarkably well on prednisone. Presently he is on a dose of 5 mg daily alternating with 7.5 mg every other day. He is also resume Celebrex and with this combination has done remarkably well;  prior to prednisone he had significant knee pain as well as cervical and lumbar pain.  At the present time he is having right arm discomfort in the triceps region. This is aggravated by movement. He is concerned about a possible statin side effect. He has been on Crestor 10 mg twice weekly he has had difficulty with other statins in the past. His cardiac status has been stable. He is treated hypertension which has been stable    Review of Systems  Constitutional: Negative for fever, chills, appetite change and fatigue.  HENT: Negative for hearing loss, ear pain, congestion, sore throat, trouble swallowing, neck stiffness, dental problem, voice change and tinnitus.   Eyes: Negative for pain, discharge and visual disturbance.  Respiratory: Negative for cough, chest tightness, wheezing and stridor.   Cardiovascular: Negative for chest pain, palpitations and leg swelling.  Gastrointestinal: Negative for nausea, vomiting, abdominal pain, diarrhea, constipation, blood in stool and abdominal distention.  Genitourinary: Negative for urgency, hematuria, flank pain, discharge, difficulty urinating and genital sores.  Musculoskeletal: Positive for back pain and arthralgias. Negative for myalgias, joint swelling and gait problem.  Skin: Negative for rash.  Neurological: Negative for dizziness, syncope, speech difficulty, weakness, numbness and headaches.  Hematological: Negative for adenopathy. Does not bruise/bleed easily.  Psychiatric/Behavioral: Negative for behavioral problems and dysphoric mood. The  patient is not nervous/anxious.        Objective:   Physical Exam  Constitutional: He is oriented to person, place, and time. He appears well-developed.       Blood pressure 120/72  HENT:  Head: Normocephalic.  Right Ear: External ear normal.  Left Ear: External ear normal.  Eyes: Conjunctivae and EOM are normal.  Neck: Normal range of motion.  Cardiovascular: Normal rate and normal heart sounds.   Pulmonary/Chest: Breath sounds normal.  Abdominal: Bowel sounds are normal.  Musculoskeletal: Normal range of motion. He exhibits no edema and no tenderness.       Pain in the triceps region with arm extension Range of motion of the right shoulder also elicits some pain  Neurological: He is alert and oriented to person, place, and time.  Psychiatric: He has a normal mood and affect. His behavior is normal.          Assessment & Plan:   Steroid responsive osteoarthritis. We'll continue Celebrex we'll attempt to decrease his prednisone to 5 mg daily. He'll follow up with orthopedics for his right arm and shoulder pain Hypertension stable Coronary artery disease stable Dyslipidemia. He will hold his Crestor for 4 weeks it is doubtful that his right arm pain is related to a statin side effect.  Recheck 3 months

## 2011-08-24 ENCOUNTER — Encounter: Payer: Self-pay | Admitting: Cardiovascular Disease

## 2011-09-06 ENCOUNTER — Ambulatory Visit (INDEPENDENT_AMBULATORY_CARE_PROVIDER_SITE_OTHER): Payer: Medicare Other | Admitting: *Deleted

## 2011-09-06 ENCOUNTER — Ambulatory Visit: Payer: Medicare Other | Admitting: Cardiovascular Disease

## 2011-09-06 ENCOUNTER — Ambulatory Visit (INDEPENDENT_AMBULATORY_CARE_PROVIDER_SITE_OTHER): Payer: Medicare Other | Admitting: Cardiovascular Disease

## 2011-09-06 ENCOUNTER — Encounter: Payer: Self-pay | Admitting: Cardiovascular Disease

## 2011-09-06 VITALS — BP 128/74 | HR 60 | Ht 70.0 in | Wt 163.4 lb

## 2011-09-06 DIAGNOSIS — E785 Hyperlipidemia, unspecified: Secondary | ICD-10-CM

## 2011-09-06 DIAGNOSIS — I10 Essential (primary) hypertension: Secondary | ICD-10-CM

## 2011-09-06 DIAGNOSIS — I2510 Atherosclerotic heart disease of native coronary artery without angina pectoris: Secondary | ICD-10-CM

## 2011-09-06 LAB — HEPATIC FUNCTION PANEL
ALT: 15 U/L (ref 0–53)
AST: 22 U/L (ref 0–37)
Albumin: 4.2 g/dL (ref 3.5–5.2)
Total Protein: 7.1 g/dL (ref 6.0–8.3)

## 2011-09-06 LAB — BASIC METABOLIC PANEL
BUN: 16 mg/dL (ref 6–23)
CO2: 31 mEq/L (ref 19–32)
Chloride: 102 mEq/L (ref 96–112)
GFR: 91.71 mL/min (ref >60.00)
Glucose, Bld: 104 mg/dL — ABNORMAL HIGH (ref 70–99)
Potassium: 4.4 mEq/L (ref 3.5–5.1)

## 2011-09-06 LAB — LIPID PANEL: HDL: 59.6 mg/dL (ref >39.00)

## 2011-09-06 LAB — LDL CHOLESTEROL, DIRECT: Direct LDL: 143.8 mg/dL

## 2011-09-06 NOTE — Progress Notes (Signed)
Dakota Green Date of Birth  [birth year] [facility] [address]    [address] [place], [place]  [address] [phone]  Fax  [phone]  History of Present Illness:  Dakota Green is a 77 year old gentleman with a history of coronary artery disease. He has a history of hyperlipidemia. He is intolerant to all statin medications.  He is having lots of problems with arthritis.  He tried Crestor again several months ago and discontinued it last month because of severe muscle pain.  He's now on Celebrex and prednisone.  He's still having considerable muscular skeletal pain.   Denies any cardiac problems.     Current Outpatient Prescriptions on File Prior to Visit  Medication Sig Dispense Refill  . aspirin 81 MG tablet Take 81 mg by mouth daily.        . benazepril (LOTENSIN) 10 MG tablet ONE DAILY  90 tablet  2  . celecoxib (CELEBREX) 200 MG capsule Take 200 mg by mouth 2 (two) times daily.        . clopidogrel (PLAVIX) 75 MG tablet Take 75 mg by mouth daily.        . Desoximetasone 0.05 % GEL Apply topically 2 (two) times daily as needed.        Marland Kitchen glucosamine-chondroitin 500-400 MG tablet Take 1 tablet by mouth 2 (two) times daily.       Marland Kitchen loratadine (CLARITIN) 10 MG tablet Take 10 mg by mouth daily.        . Multiple Vitamins-Minerals (PRESERVISION/LUTEIN PO) Take by mouth daily.        . predniSONE (DELTASONE) 5 MG tablet Take 1 tablet (5 mg total) by mouth daily.  90 tablet  3  . temazepam (RESTORIL) 30 MG capsule Take 1 capsule (30 mg total) by mouth at bedtime as needed.  50 capsule  2    Allergies  Allergen Reactions  . Crestor (Rosuvastatin Calcium)     Aches     Past Medical History  Diagnosis Date  . ALLERGIC RHINITIS 05/04/2007  . CORONARY ARTERY DISEASE 05/04/2007  . DIVERTICULOSIS, COLON 08/14/2009  . ESOPHAGEAL STRICTURE 08/14/2009  . GERD 05/04/2007  . HIATAL HERNIA 08/14/2009  . HYPERLIPIDEMIA 05/04/2007  . HYPERTENSION 05/04/2007  . Osteoarth NOS-Unspec 05/04/2007  . PEPTIC  ULCER DISEASE 05/04/2007  . DJD (degenerative joint disease)     Past Surgical History  Procedure Date  . Coronary artery bypass graft   . Cataract extraction   . Coronary angioplasty with stent placement   . Cardiac catheterization 06/12/2007    EF 45%  . Cardiac catheterization 01/18/2005    EF 50-55%  . Cardiac catheterization 01/14/2003    EF 55%  . US echocardiography 08/23/2005    EF 50-55%  . Cardiovascular stress test 02/07/2001    History  Smoking status  . Former Smoker  . Quit date: 11/29/1985  Smokeless tobacco  . Never Used    History  Alcohol Use  . Yes    occ    Family History  Problem Relation Age of Onset  . Heart attack Mother     Reviw of Systems:  Reviewed in the HPI.  All other systems are negative.  Physical Exam: BP 128/74  Pulse 60  Ht 5\' 10"  (1.778 m)  Wt 163 lb 6.4 oz (74.118 kg)  BMI 23.45 kg/m2  The patient is alert and oriented x 3.  The mood and affect are normal.   Skin: warm and dry.  Color is normal.  HEENT:   the sclera are nonicteric.  The mucous membranes are moist.  The carotids are 2+ without bruits.  There is no thyromegaly.  There is no JVD.    Lungs: clear.  The chest wall is non tender.    Heart: regular rate with a normal S1 and S2.  There are no murmurs, gallops, or rubs. The PMI is not displaced.     Abdomen: good bowel sounds.  There is no guarding or rebound.  There is no hepatosplenomegaly or tenderness.  There are no masses.   Extremities:  no clubbing, cyanosis, or edema.  The legs are without rashes.  The distal pulses are intact.   Neuro:  Cranial nerves II - XII are intact.  Motor and sensory functions are intact.    The gait is normal.  ECG: Sinus bradycardia. No St/T abnormalities.  Assessment / Plan:

## 2011-09-06 NOTE — Patient Instructions (Signed)
Your physician recommends that you return for lab work in: one year  Your physician wants you to follow-up in: one year.  You will receive a reminder letter in the mail two months in advance. If you don't receive a letter, please call our office to schedule the follow-up appointment.

## 2011-09-06 NOTE — Assessment & Plan Note (Addendum)
Doing well.  Continue current meds.  Continue diet and exercise since he doesn't tolerate statins.  I'll see him in one year. We'll check a fasting lipid profile, hepatic profile, and basic metabolic profile at that time.

## 2011-09-15 ENCOUNTER — Other Ambulatory Visit: Payer: Self-pay | Admitting: Dermatology

## 2011-09-27 ENCOUNTER — Other Ambulatory Visit: Payer: Self-pay | Admitting: *Deleted

## 2011-09-27 MED ORDER — TEMAZEPAM 30 MG PO CAPS: 30.0000 mg | ORAL_CAPSULE | Freq: Every evening | ORAL | Status: DC | PRN

## 2011-10-01 ENCOUNTER — Telehealth: Payer: Self-pay | Admitting: Internal Medicine

## 2011-10-01 NOTE — Telephone Encounter (Signed)
Called into [facility]  - this was called in on 10/29 originally

## 2011-10-01 NOTE — Telephone Encounter (Signed)
Refill Temazepam 30mg  to [facility]in [place]. He is out. Thanks.

## 2011-11-18 ENCOUNTER — Ambulatory Visit (INDEPENDENT_AMBULATORY_CARE_PROVIDER_SITE_OTHER): Payer: Medicare Other | Admitting: Internal Medicine

## 2011-11-18 ENCOUNTER — Encounter: Payer: Self-pay | Admitting: Internal Medicine

## 2011-11-18 DIAGNOSIS — I2510 Atherosclerotic heart disease of native coronary artery without angina pectoris: Secondary | ICD-10-CM

## 2011-11-18 DIAGNOSIS — I10 Essential (primary) hypertension: Secondary | ICD-10-CM

## 2011-11-18 DIAGNOSIS — M1990 Unspecified osteoarthritis, unspecified site: Secondary | ICD-10-CM

## 2011-11-18 DIAGNOSIS — Z0000 Encounter for general adult medical examination without abnormal findings: Secondary | ICD-10-CM

## 2011-11-18 DIAGNOSIS — E785 Hyperlipidemia, unspecified: Secondary | ICD-10-CM

## 2011-11-18 MED ORDER — TEMAZEPAM 30 MG PO CAPS: 30.0000 mg | ORAL_CAPSULE | Freq: Every evening | ORAL | Status: DC | PRN

## 2011-11-18 MED ORDER — BENAZEPRIL HCL 10 MG PO TABS: 10.0000 mg | ORAL_TABLET | Freq: Every day | ORAL | Status: DC

## 2011-11-18 MED ORDER — CELECOXIB 200 MG PO CAPS: 200.0000 mg | ORAL_CAPSULE | Freq: Two times a day (BID) | ORAL | Status: DC

## 2011-11-18 MED ORDER — ESOMEPRAZOLE MAGNESIUM 40 MG PO CPDR: 40.0000 mg | DELAYED_RELEASE_CAPSULE | Freq: Every day | ORAL | Status: DC

## 2011-11-18 MED ORDER — PREDNISONE 5 MG PO TABS: 5.0000 mg | ORAL_TABLET | Freq: Every day | ORAL | Status: DC

## 2011-11-18 MED ORDER — CLOPIDOGREL BISULFATE 75 MG PO TABS: 75.0000 mg | ORAL_TABLET | Freq: Every day | ORAL | Status: DC

## 2011-11-18 NOTE — Patient Instructions (Signed)
Limit your sodium (Salt) intake    It is important that you exercise regularly, at least 20 minutes 3 to 4 times per week.  If you develop chest pain or shortness of breath seek  medical attention.  Return in 6 months for follow-up  

## 2011-11-18 NOTE — Progress Notes (Signed)
Subjective:    Patient ID: Dakota Green, male    DOB: 06/08/1933, 77 y.o.   MRN: [id number]  HPI  77 year old patient who is seen today for followup and any preventive health examination. Is followed by cardiology for coronary artery disease. When seen here last he was on Crestor twice weekly which he did seem  To  tolerate but subsequently this has been discontinued. He has been intolerant of other statins as well. He has a history of inflammatory osteoarthritis and has done quite well on prednisone 5 mg daily. Attempts at discontinuation have resulted in significant arthritic pain. He also remains on Celebrex. Quite well today; denies any cardiopulmonary complaints   Here for Medicare AWV:   1. Risk factors based on Past M, S, F history: patient has known coronary artery disease, history of dyslipidemia, hypertension.  2. Physical Activities: goes to the gym 4 times weekly  3. Depression/mood: no history of depression, or mood disorder  4. Hearing: no deficits  5. ADL's: independent in all aspects of daily living  6. Fall Risk: low  7. Home Safety: no problems identified  8. Height, weight, &visual acuity:height and weight stable. No difficulty with visual acuity  9. Counseling: heart healthy diet, continued regular exercise, all encouraged  10. Labs ordered based on risk factors: laboratory profile, including lipid profile, and sedimentation rate will be reviewed  11. Referral Coordination- rheumatology referral as scheduled  12. Care Plan- will follow up with rheumatology. Will review a sedimentation rate will also discontinue at least temporarily. The Crestor  13. Cognitive Assessment- alert and oriented, with normal affect. No history of memory concerns handles all executive functioning without difficulty   Allergies (verified):  No Known Drug Allergies  Past History:  Past Medical History:  Reviewed history from 04/03/2009 and no changes required.  Coronary artery disease  GERD  stricture formation  Hyperlipidemia  Hypertension  Peptic ulcer disease  Allergic rhinitis  DJD  Osteoarthritis   Past Surgical History:  Reviewed history from 08/18/2009 and no changes required.  Coronary artery bypass graft 1999  Cardiac Stent placement-1987, 1998  Cataract extraction-bilaterally  cardiac catheterization February 2009  EGD 2007  colonoscopy 2004   Family History:  Reviewed history from 08/18/2009 and no changes required.  father died age 77 mother died age 35, MI, hypertension  6 brothers 3 sisters, positive for lung and prostate cancer, coronary artery disease  Family History of Prostate Cancer: Brother  No FH of Colon Cancer:  Family History of Heart Disease: Mother   Social History:  Reviewed history from 08/18/2009 and no changes required.  Retired  Married  Patient is a former smoker. -stopped 1987  Alcohol Use - yes-4 drinks daily  Daily Caffeine Use-1 cup daily  Illicit Drug Use - no   Review of Systems  Constitutional: Negative for fever, chills, activity change, appetite change and fatigue.  HENT: Negative for hearing loss, ear pain, congestion, rhinorrhea, sneezing, mouth sores, trouble swallowing, neck pain, neck stiffness, dental problem, voice change, sinus pressure and tinnitus.   Eyes: Negative for photophobia, pain, redness and visual disturbance.  Respiratory: Negative for apnea, cough, choking, chest tightness, shortness of breath and wheezing.   Cardiovascular: Negative for chest pain, palpitations and leg swelling.  Gastrointestinal: Negative for nausea, vomiting, abdominal pain, diarrhea, constipation, blood in stool, abdominal distention, anal bleeding and rectal pain.  Genitourinary: Negative for dysuria, urgency, frequency, hematuria, flank pain, decreased urine volume, discharge, penile swelling, scrotal swelling, difficulty urinating, genital sores and  testicular pain.  Musculoskeletal: Positive for back pain, joint swelling and  arthralgias. Negative for myalgias and gait problem.  Skin: Negative for color change, rash and wound.  Neurological: Negative for dizziness, tremors, seizures, syncope, facial asymmetry, speech difficulty, weakness, light-headedness, numbness and headaches.  Hematological: Negative for adenopathy. Does not bruise/bleed easily.  Psychiatric/Behavioral: Negative for suicidal ideas, hallucinations, behavioral problems, confusion, sleep disturbance, self-injury, dysphoric mood, decreased concentration and agitation. The patient is not nervous/anxious.        Objective:   Physical Exam  Constitutional: He appears well-developed and well-nourished.  HENT:  Head: Normocephalic and atraumatic.  Right Ear: External ear normal.  Left Ear: External ear normal.  Nose: Nose normal.  Mouth/Throat: Oropharynx is clear and moist.  Eyes: Conjunctivae and EOM are normal. Pupils are equal, round, and reactive to light. No scleral icterus.  Neck: Normal range of motion. Neck supple. No JVD present. No thyromegaly present.  Cardiovascular: Regular rhythm, normal heart sounds and intact distal pulses.  Exam reveals no gallop and no friction rub.   No murmur heard.      Posterior tibial pulses not easily palpable  Pulmonary/Chest: Effort normal and breath sounds normal. He exhibits no tenderness.  Abdominal: Soft. Bowel sounds are normal. He exhibits no distension and no mass. There is no tenderness.  Genitourinary: Penis normal. Guaiac negative stool.       Prostate +2 enlarged  Musculoskeletal: Normal range of motion. He exhibits no edema and no tenderness.  Lymphadenopathy:    He has no cervical adenopathy.  Neurological: He is alert. He has normal reflexes. No cranial nerve deficit. Coordination normal.  Skin: Skin is warm and dry. No rash noted.  Psychiatric: He has a normal mood and affect. His behavior is normal.          Assessment & Plan:   Preventive health examination Coronary artery  disease stable Hypertension well controlled Dyslipidemia Inflammatory osteoarthritis. We'll continue present regimen. Recheck in 4 months Impaired glucose tolerance. Stable

## 2011-11-30 HISTORY — PX: SHOULDER ARTHROSCOPY: SHX128

## 2011-12-23 ENCOUNTER — Telehealth: Payer: Self-pay | Admitting: Family Medicine

## 2011-12-23 NOTE — Telephone Encounter (Signed)
Mr. Dorvil  is on prednisone due to inflammatory osteoarthritis

## 2011-12-23 NOTE — Telephone Encounter (Signed)
Dr. Kirtland Bouchard,  Is Dakota Green on Prednisone due to a transplant at all, or is it for his OA?

## 2011-12-30 ENCOUNTER — Other Ambulatory Visit: Payer: Self-pay

## 2011-12-30 MED ORDER — TEMAZEPAM 30 MG PO CAPS: 30.0000 mg | ORAL_CAPSULE | Freq: Every evening | ORAL | Status: DC | PRN

## 2011-12-30 NOTE — Telephone Encounter (Signed)
30

## 2011-12-30 NOTE — Telephone Encounter (Signed)
Rx called in to pharmacy. 

## 2011-12-30 NOTE — Telephone Encounter (Signed)
Rx request for temazepam 30 mg;  Rx last filled 11/18/11 and pt last seen at this time as well.  Pls advise.

## 2012-03-20 ENCOUNTER — Telehealth: Payer: Self-pay | Admitting: Family Medicine

## 2012-03-20 NOTE — Telephone Encounter (Signed)
Please advise - last seen 10/2011 - the best I can see in past meds - it was increased in June 2012 - please advise if ok to change sig to bid.

## 2012-03-20 NOTE — Telephone Encounter (Signed)
Kim, there seems to be some confusion about Neng's Nexium. The chart states ocne daily, but I have a prior auth [facility] that say QTU 30, take twice daily. Mrs. Cacciola is calling saying it needs prior auth, which it would if it is 2x daily. Please advise as to correct SIG. Thanks.

## 2012-03-20 NOTE — Telephone Encounter (Signed)
Spoke with wife. She states he usually takes it once daily, but goes to BID when he has a flare up. I went ahead and got a 1-yr prior auth on BID, just in case.

## 2012-04-25 ENCOUNTER — Other Ambulatory Visit: Payer: Self-pay

## 2012-04-25 MED ORDER — TEMAZEPAM 30 MG PO CAPS: 30.0000 mg | ORAL_CAPSULE | Freq: Every evening | ORAL | Status: DC | PRN

## 2012-05-14 ENCOUNTER — Other Ambulatory Visit: Payer: Self-pay | Admitting: Internal Medicine

## 2012-05-18 ENCOUNTER — Ambulatory Visit (INDEPENDENT_AMBULATORY_CARE_PROVIDER_SITE_OTHER): Payer: Medicare Other | Admitting: Internal Medicine

## 2012-05-18 ENCOUNTER — Encounter: Payer: Self-pay | Admitting: Internal Medicine

## 2012-05-18 VITALS — BP 120/80 | Temp 98.1°F | Wt 163.0 lb

## 2012-05-18 DIAGNOSIS — I10 Essential (primary) hypertension: Secondary | ICD-10-CM

## 2012-05-18 DIAGNOSIS — M1990 Unspecified osteoarthritis, unspecified site: Secondary | ICD-10-CM

## 2012-05-18 DIAGNOSIS — E785 Hyperlipidemia, unspecified: Secondary | ICD-10-CM

## 2012-05-18 DIAGNOSIS — I2510 Atherosclerotic heart disease of native coronary artery without angina pectoris: Secondary | ICD-10-CM

## 2012-05-18 MED ORDER — BENAZEPRIL HCL 10 MG PO TABS: 10.0000 mg | ORAL_TABLET | Freq: Every day | ORAL | Status: DC

## 2012-05-18 MED ORDER — PREDNISONE 5 MG PO TABS: 5.0000 mg | ORAL_TABLET | Freq: Every day | ORAL | Status: DC

## 2012-05-18 MED ORDER — ESOMEPRAZOLE MAGNESIUM 40 MG PO CPDR: 40.0000 mg | DELAYED_RELEASE_CAPSULE | Freq: Every day | ORAL | Status: DC

## 2012-05-18 MED ORDER — CELECOXIB 200 MG PO CAPS: 200.0000 mg | ORAL_CAPSULE | Freq: Two times a day (BID) | ORAL | Status: DC

## 2012-05-18 MED ORDER — TEMAZEPAM 30 MG PO CAPS: 30.0000 mg | ORAL_CAPSULE | Freq: Every evening | ORAL | Status: DC | PRN

## 2012-05-18 MED ORDER — CLOPIDOGREL BISULFATE 75 MG PO TABS: 75.0000 mg | ORAL_TABLET | Freq: Every day | ORAL | Status: DC

## 2012-05-18 NOTE — Progress Notes (Signed)
  Subjective:    Patient ID: Dakota Green, male    DOB: 05/31/1933, 78 y.o.   MRN: [id number]  HPI  78 year old patient who is seen today for his biannual followup. He has done quite well. He has a history of inflammatory osteoarthritis and has up titrated his prednisone slightly to 5 mg alternating with 7.5 mg every other day. He is doing quite well at present and we'll try again a dose reduction he has coronary artery disease and has been quite stable. He has a history of statin intolerance he has treated hypertension. Other problems include gastroesophageal reflux disease which has been stable. He is quite pleased with his clinical status    Review of Systems  Constitutional: Negative for fever, chills, appetite change and fatigue.  HENT: Negative for hearing loss, ear pain, congestion, sore throat, trouble swallowing, neck stiffness, dental problem, voice change and tinnitus.   Eyes: Negative for pain, discharge and visual disturbance.  Respiratory: Negative for cough, chest tightness, wheezing and stridor.   Cardiovascular: Negative for chest pain, palpitations and leg swelling.  Gastrointestinal: Negative for nausea, vomiting, abdominal pain, diarrhea, constipation, blood in stool and abdominal distention.  Genitourinary: Negative for urgency, hematuria, flank pain, discharge, difficulty urinating and genital sores.  Musculoskeletal: Positive for back pain and arthralgias (shoulder pain). Negative for myalgias, joint swelling and gait problem.  Skin: Negative for rash.  Neurological: Negative for dizziness, syncope, speech difficulty, weakness, numbness and headaches.  Hematological: Negative for adenopathy. Does not bruise/bleed easily.  Psychiatric/Behavioral: Negative for behavioral problems and dysphoric mood. The patient is not nervous/anxious.        Objective:   Physical Exam  Constitutional: He is oriented to person, place, and time. He appears well-developed.  HENT:  Head:  Normocephalic.  Right Ear: External ear normal.  Left Ear: External ear normal.  Eyes: Conjunctivae and EOM are normal.  Neck: Normal range of motion.  Cardiovascular: Normal rate and normal heart sounds.   Pulmonary/Chest: Breath sounds normal.  Abdominal: Bowel sounds are normal.  Musculoskeletal: Normal range of motion. He exhibits no edema and no tenderness.  Neurological: He is alert and oriented to person, place, and time.  Psychiatric: He has a normal mood and affect. His behavior is normal.          Assessment & Plan:   Coronary artery disease stable Dyslipidemia/statin intolerance Hypertension well controlled Osteoarthritis. We'll decrease prednisone to 5 mg daily  CPX 6 months All medications refilled

## 2012-05-18 NOTE — Patient Instructions (Signed)
Limit your sodium (Salt) intake    It is important that you exercise regularly, at least 20 minutes 3 to 4 times per week.  If you develop chest pain or shortness of breath seek  medical attention.  Return in 6 months for follow-up  

## 2012-06-20 ENCOUNTER — Other Ambulatory Visit: Payer: Self-pay

## 2012-06-20 MED ORDER — TEMAZEPAM 30 MG PO CAPS: 30.0000 mg | ORAL_CAPSULE | Freq: Every evening | ORAL | Status: DC | PRN

## 2012-07-10 ENCOUNTER — Telehealth: Payer: Self-pay | Admitting: Cardiovascular Disease

## 2012-07-10 NOTE — Telephone Encounter (Signed)
New problem:  Per Recall need lab order put into the system

## 2012-07-11 NOTE — Telephone Encounter (Signed)
Noted/ pt has done with pcp

## 2012-07-23 ENCOUNTER — Encounter (HOSPITAL_COMMUNITY): Payer: Self-pay | Admitting: Emergency Medicine

## 2012-07-23 ENCOUNTER — Emergency Department (HOSPITAL_COMMUNITY)
Admission: EM | Admit: 2012-07-23 | Discharge: 2012-07-23 | Disposition: A | Discharge status: Home / Self Care | Payer: Medicare Other | Attending: Emergency Medicine | Admitting: Emergency Medicine

## 2012-07-23 ENCOUNTER — Emergency Department (HOSPITAL_COMMUNITY): Payer: Medicare Other

## 2012-07-23 DIAGNOSIS — W19XXXA Unspecified fall, initial encounter: Secondary | ICD-10-CM | POA: Insufficient documentation

## 2012-07-23 DIAGNOSIS — K219 Gastro-esophageal reflux disease without esophagitis: Secondary | ICD-10-CM | POA: Insufficient documentation

## 2012-07-23 DIAGNOSIS — I2510 Atherosclerotic heart disease of native coronary artery without angina pectoris: Secondary | ICD-10-CM | POA: Insufficient documentation

## 2012-07-23 DIAGNOSIS — Z951 Presence of aortocoronary bypass graft: Secondary | ICD-10-CM | POA: Insufficient documentation

## 2012-07-23 DIAGNOSIS — S6990XA Unspecified injury of unspecified wrist, hand and finger(s), initial encounter: Secondary | ICD-10-CM | POA: Insufficient documentation

## 2012-07-23 DIAGNOSIS — M1990 Unspecified osteoarthritis, unspecified site: Secondary | ICD-10-CM | POA: Insufficient documentation

## 2012-07-23 DIAGNOSIS — S59909A Unspecified injury of unspecified elbow, initial encounter: Secondary | ICD-10-CM | POA: Insufficient documentation

## 2012-07-23 DIAGNOSIS — I10 Essential (primary) hypertension: Secondary | ICD-10-CM | POA: Insufficient documentation

## 2012-07-23 DIAGNOSIS — S4990XA Unspecified injury of shoulder and upper arm, unspecified arm, initial encounter: Secondary | ICD-10-CM

## 2012-07-23 DIAGNOSIS — S59919A Unspecified injury of unspecified forearm, initial encounter: Secondary | ICD-10-CM | POA: Insufficient documentation

## 2012-07-23 MED ORDER — HYDROCODONE-ACETAMINOPHEN 5-325 MG PO TABS
1.0000 | ORAL_TABLET | Freq: Once | ORAL | Status: AC
  Administered 2012-07-23: 1 via ORAL
  Filled 2012-07-23: qty 1

## 2012-07-23 MED ORDER — HYDROCODONE-ACETAMINOPHEN 5-325 MG PO TABS: 1.0000 | ORAL_TABLET | Freq: Four times a day (QID) | ORAL | Status: AC | PRN

## 2012-07-23 NOTE — ED Notes (Signed)
Fell in yard yesterday while doing yard work, pain in right upper arm with movement. Fingers start to tingle when arm is resting in neutral position

## 2012-07-23 NOTE — ED Provider Notes (Signed)
History     CSN: [id number]  Arrival date & time 07/23/12  1112   First MD Initiated Contact with Patient 07/23/12 1304      Chief Complaint  Patient presents with  . Arm Injury    (Consider location/radiation/quality/duration/timing/severity/associated sxs/prior treatment) HPI Patient had a recent fall and has upper arm pain on the R. The patient had another fall several months back and he noted an injury to that upper arm at that time. The patient states that he can lift his arm up and has to use his other hand to lift that arm. The patient denies shoulder pain or neck pain. The patient can move his arm at the elbow with no issues. The patient states that he noted a bulging area to the bicep muscle from the previous injury. The patient denies chest pain, neck pain, shortness of breath, numbness, weakness, nausea, vomiting, or back pain. The patient states that he has not taken anything prior to arrival for the pain. Past Medical History  Diagnosis Date  . ALLERGIC RHINITIS 05/04/2007  . CORONARY ARTERY DISEASE 05/04/2007  . DIVERTICULOSIS, COLON 08/14/2009  . ESOPHAGEAL STRICTURE 08/14/2009  . GERD 05/04/2007  . HIATAL HERNIA 08/14/2009  . HYPERLIPIDEMIA 05/04/2007  . HYPERTENSION 05/04/2007  . Osteoarth NOS-Unspec 05/04/2007  . PEPTIC ULCER DISEASE 05/04/2007  . DJD (degenerative joint disease)     Past Surgical History  Procedure Date  . Coronary artery bypass graft   . Cataract extraction   . Coronary angioplasty with stent placement   . Cardiac catheterization 06/12/2007    EF 45%  . Cardiac catheterization 01/18/2005    EF 50-55%  . Cardiac catheterization 01/14/2003    EF 55%  . US echocardiography 08/23/2005    EF 50-55%  . Cardiovascular stress test 02/07/2001    Family History  Problem Relation Age of Onset  . Heart attack Mother     History  Substance Use Topics  . Smoking status: Former Smoker    Quit date: 11/29/1985  . Smokeless tobacco: Never Used  . Alcohol Use:  Yes     occ      Review of Systems All other systems negative except as documented in the HPI. All pertinent positives and negatives as reviewed in the HPI.  Allergies  Crestor  Home Medications   Current Outpatient Rx  Name Route Sig Dispense Refill  . ASPIRIN 81 MG PO TABS Oral Take 81 mg by mouth daily.      Marland Kitchen BENAZEPRIL HCL 10 MG PO TABS Oral Take 1 tablet (10 mg total) by mouth daily. 90 tablet 2  . CELECOXIB 200 MG PO CAPS Oral Take 1 capsule (200 mg total) by mouth 2 (two) times daily. 90 capsule 6  . CLOPIDOGREL BISULFATE 75 MG PO TABS Oral Take 1 tablet (75 mg total) by mouth daily. 90 tablet 6  . DESOXIMETASONE 0.05 % EX GEL Apply externally Apply topically 2 (two) times daily as needed.      Marland Kitchen ESOMEPRAZOLE MAGNESIUM 40 MG PO CPDR Oral Take 1 capsule (40 mg total) by mouth daily before breakfast. 90 capsule 6  . OMEGA-3 FATTY ACIDS 1000 MG PO CAPS Oral Take 1,000 mg by mouth 3 (three) times daily.      Marland Kitchen GLUCOSAMINE-CHONDROITIN 500-400 MG PO TABS Oral Take 1 tablet by mouth 2 (two) times daily.     Marland Kitchen LORATADINE 10 MG PO TABS Oral Take 10 mg by mouth daily.      Marland Kitchen PRESERVISION/LUTEIN PO  Oral Take by mouth daily.      Marland Kitchen PREDNISONE 5 MG PO TABS Oral Take 1 tablet (5 mg total) by mouth daily. 90 tablet 3  . TEMAZEPAM 30 MG PO CAPS Oral Take 1 capsule (30 mg total) by mouth at bedtime as needed. 30 capsule 1    BP 130/65  Pulse 54  Temp 97.7 F (36.5 C) (Oral)  Resp 18  Wt 160 lb (72.576 kg)  SpO2 98%  Physical Exam  Nursing note and vitals reviewed. Constitutional: He appears well-developed and well-nourished. No distress.  Cardiovascular: Normal rate and regular rhythm.   Pulmonary/Chest: Effort normal and breath sounds normal.  Musculoskeletal:       Arms:   ED Course  Procedures (including critical care time)  Labs Reviewed - No data to display Dg Humerus Right  07/23/2012  *RADIOLOGY REPORT*  Clinical Data: Larey Seat.  RIGHT HUMERUS - 2+ VIEW  Comparison: None   Findings: The shoulder and elbow joints are maintained. A large olecranon spur is noted.  No fracture of the humerus is identified. The visualized right ribs are intact.  IMPRESSION: No acute fracture.   Original Report Authenticated By: P. Loralie Champagne, M.D.    The patient has been seen by the attending Physician.  The patient is referred to his ortho for further eval. He is advised to return here as needed. The patient most likely has a bicep/tricep injury. Told to ice and elevate the arm.   MDM          Carlyle Dolly, PA-C 07/23/12 1424

## 2012-07-23 NOTE — ED Provider Notes (Signed)
Medical screening examination/treatment/procedure(s) were conducted as a shared visit with non-physician practitioner(s) and myself.  I personally evaluated the patient during the encounter C/o pain and swelling in r biceps area.  Larey Seat a few weeks ago onto right outstretched arm.   Has had on and off pain since then.  Today, much more severe.  PE c/w biceps tendon muscle or tendon rupture.  Will splint and send to ortho.  Cheri Guppy, MD 07/23/12 1525

## 2012-08-10 ENCOUNTER — Telehealth: Payer: Self-pay | Admitting: *Deleted

## 2012-08-10 NOTE — Telephone Encounter (Signed)
Eulah Pont and Wyline Mood called/ Sheri asking Dr Elease Hashimoto to call Dr Wyline Mood next week in regards to the pt. Call 08/14/12 office [phone] or cell [phone]. Will forward to Dr Elease Hashimoto

## 2012-08-11 ENCOUNTER — Ambulatory Visit (INDEPENDENT_AMBULATORY_CARE_PROVIDER_SITE_OTHER): Payer: Medicare Other | Admitting: Internal Medicine

## 2012-08-11 ENCOUNTER — Encounter: Payer: Self-pay | Admitting: Internal Medicine

## 2012-08-11 VITALS — BP 110/76 | HR 76 | Temp 98.3°F | Resp 18 | Ht 68.5 in | Wt 162.0 lb

## 2012-08-11 DIAGNOSIS — Z23 Encounter for immunization: Secondary | ICD-10-CM

## 2012-08-11 DIAGNOSIS — E785 Hyperlipidemia, unspecified: Secondary | ICD-10-CM

## 2012-08-11 DIAGNOSIS — I2510 Atherosclerotic heart disease of native coronary artery without angina pectoris: Secondary | ICD-10-CM

## 2012-08-11 DIAGNOSIS — I10 Essential (primary) hypertension: Secondary | ICD-10-CM

## 2012-08-11 NOTE — Patient Instructions (Signed)
Cardiology followup as scheduled  Limit your sodium (Salt) intake  Hold Plavix aspirin and Celebrex 5 days prior to surgery  Continue prednisone

## 2012-08-11 NOTE — Progress Notes (Signed)
Subjective:    Patient ID: Dakota Green, male    DOB: Nov 13, 1933, 79 y.o.   MRN: [id number]  HPI  78 year old patient who is seen today for a preoperative evaluation. He has a history of extensive coronary artery disease and is status post CABG as well as stenting. His last cardiac catheterization was in July of 2008 and at that time he was noted to have severe native coronary artery disease but widely patent grafts. He had some moderate in-stent stenoses. He has been on chronic aspirin and Plavix therapy. He remains asymptomatic. He is quite active including going to the [facility] 4 times per week. He walks on a treadmill at 4 miles per hour with an incline and 6 degrees for 45-60 minutes. He denies any exertional chest pain or any change in his exercise capacity. He has significant arthritis and a history of inflammatory osteoarthritis requiring chronic prednisone use. There have been a number of attempts at tapering and discontinuation of prednisone that have failed. He also remains on Celebrex. Approximately 3 weeks ago he sustained a traumatic injury to the right shoulder and will require surgical repair. 8 weeks ago he suffered a rupture to the right biceps tendon. He has hypertension dyslipidemia and has been intolerant of statins. He has a history of gastrointestinal reflux disease with esophageal stricture and has been on chronic Nexium use.  Past Medical History  Diagnosis Date  . ALLERGIC RHINITIS 05/04/2007  . CORONARY ARTERY DISEASE 05/04/2007  . DIVERTICULOSIS, COLON 08/14/2009  . ESOPHAGEAL STRICTURE 08/14/2009  . GERD 05/04/2007  . HIATAL HERNIA 08/14/2009  . HYPERLIPIDEMIA 05/04/2007  . HYPERTENSION 05/04/2007  . Osteoarth NOS-Unspec 05/04/2007  . PEPTIC ULCER DISEASE 05/04/2007  . DJD (degenerative joint disease)     History   Social History  . Marital Status: Married    Spouse Name: N/A    Number of Children: N/A  . Years of Education: N/A   Occupational History  . Not on file.    Social History Main Topics  . Smoking status: Former Smoker    Quit date: 11/29/1985  . Smokeless tobacco: Never Used  . Alcohol Use: Yes     occ  . Drug Use: No  . Sexually Active: Not on file   Other Topics Concern  . Not on file   Social History Narrative  . No narrative on file    Past Surgical History  Procedure Date  . Coronary artery bypass graft   . Cataract extraction   . Coronary angioplasty with stent placement   . Cardiac catheterization 06/12/2007    EF 45%  . Cardiac catheterization 01/18/2005    EF 50-55%  . Cardiac catheterization 01/14/2003    EF 55%  . US echocardiography 08/23/2005    EF 50-55%  . Cardiovascular stress test 02/07/2001    Family History  Problem Relation Age of Onset  . Heart attack Mother     Allergies  Allergen Reactions  . Crestor (Rosuvastatin Calcium)     Aches     Current Outpatient Prescriptions on File Prior to Visit  Medication Sig Dispense Refill  . aspirin 81 MG tablet Take 81 mg by mouth daily.        . benazepril (LOTENSIN) 10 MG tablet Take 1 tablet (10 mg total) by mouth daily.  90 tablet  2  . celecoxib (CELEBREX) 200 MG capsule Take 1 capsule (200 mg total) by mouth 2 (two) times daily.  90 capsule  6  . clopidogrel (PLAVIX)  75 MG tablet Take 1 tablet (75 mg total) by mouth daily.  90 tablet  6  . Desoximetasone 0.05 % GEL Apply topically 2 (two) times daily as needed.        Marland Kitchen esomeprazole (NEXIUM) 40 MG capsule Take 1 capsule (40 mg total) by mouth daily before breakfast.  90 capsule  6  . fish oil-omega-3 fatty acids 1000 MG capsule Take 1,000 mg by mouth 3 (three) times daily.        Marland Kitchen glucosamine-chondroitin 500-400 MG tablet Take 1 tablet by mouth 2 (two) times daily.       Marland Kitchen loratadine (CLARITIN) 10 MG tablet Take 10 mg by mouth daily.        . Multiple Vitamins-Minerals (PRESERVISION/LUTEIN PO) Take by mouth daily.        . predniSONE (DELTASONE) 5 MG tablet Take 1 tablet (5 mg total) by mouth daily.  90  tablet  3  . temazepam (RESTORIL) 30 MG capsule Take 1 capsule (30 mg total) by mouth at bedtime as needed.  30 capsule  1    BP 110/76  Pulse 76  Temp 98.3 F (36.8 C) (Oral)  Resp 18  Ht 5' 8.5" (1.74 m)  Wt 162 lb (73.483 kg)  BMI 24.27 kg/m2  SpO2 95%       Review of Systems  Constitutional: Negative for fever, chills, appetite change and fatigue.  HENT: Negative for hearing loss, ear pain, congestion, sore throat, trouble swallowing, neck stiffness, dental problem, voice change and tinnitus.   Eyes: Negative for pain, discharge and visual disturbance.  Respiratory: Negative for cough, chest tightness, wheezing and stridor.   Cardiovascular: Negative for chest pain, palpitations and leg swelling.  Gastrointestinal: Negative for nausea, vomiting, abdominal pain, diarrhea, constipation, blood in stool and abdominal distention.  Genitourinary: Negative for urgency, hematuria, flank pain, discharge, difficulty urinating and genital sores.  Musculoskeletal: Negative for myalgias, back pain, joint swelling, arthralgias (Right shoulder pain) and gait problem.  Skin: Negative for rash.  Neurological: Negative for dizziness, syncope, speech difficulty, weakness, numbness and headaches.  Hematological: Negative for adenopathy. Does not bruise/bleed easily.  Psychiatric/Behavioral: Negative for behavioral problems and dysphoric mood. The patient is not nervous/anxious.        Objective:   Physical Exam  Constitutional: He is oriented to person, place, and time. He appears well-developed.       Blood pressure well controlled  HENT:  Head: Normocephalic.  Right Ear: External ear normal.  Left Ear: External ear normal.  Eyes: Conjunctivae normal and EOM are normal.  Neck: Normal range of motion. Neck supple. No JVD present.  Cardiovascular: Normal rate, regular rhythm and normal heart sounds.        Dorsalis pedis pulse is intact. Posterior tibial pulses not easily palpable    Pulmonary/Chest: Breath sounds normal.  Abdominal: Soft. Bowel sounds are normal.  Musculoskeletal: Normal range of motion. He exhibits no edema and no tenderness.  Neurological: He is alert and oriented to person, place, and time.  Psychiatric: He has a normal mood and affect. His behavior is normal.          Assessment & Plan:   Coronary artery disease- patient has excellent functional capacity in spite of his orthopedic limitations and has no history of exertional chest pain or shortness of breath.  Hypertension stable Dyslipidemia stable  Patient is medically cleared for orthopedic surgery and;  will need to stop aspirin and Plavix 5-7 days prior to surgery as well as  Celebrex

## 2012-08-15 NOTE — Telephone Encounter (Signed)
Dr Wyline Mood spoke with Dr Elease Hashimoto, pt can hold Plavix 5 days but requested he continue asa,

## 2012-08-17 ENCOUNTER — Telehealth: Payer: Self-pay | Admitting: Family Medicine

## 2012-08-17 MED ORDER — TEMAZEPAM 30 MG PO CAPS: 30.0000 mg | ORAL_CAPSULE | Freq: Every evening | ORAL | Status: DC | PRN

## 2012-08-17 MED ORDER — HYDROCODONE-ACETAMINOPHEN 5-500 MG PO TABS: 1.0000 | ORAL_TABLET | Freq: Four times a day (QID) | ORAL | Status: DC | PRN

## 2012-08-17 NOTE — Telephone Encounter (Signed)
Wife aware [facility]

## 2012-08-17 NOTE — Telephone Encounter (Signed)
Husband saw Dr. Kirtland Bouchard last Fri for a torn rotator cuff. Has sen Ortho, surgery sched for 10/1. [facility] rx'd him hydrocodone - acet 5-325. Take 1PO Q 6hrs. He doesn't like to take it, d/y constipation. Sometimes his pain is so bad, he can't help but take it. He is out and has been for several days. Wife has been calling over to Ortho (Dr. Thurston Hole) for 3 days, with no call back. Pt is taking Tramadol, which [facility] rx'd, but sometimes it is not enough.  Because he cannot get a call back from Ortho, and is out of pain meds, wife is wanting to know if there is ANY way Dr. Kirtland Bouchard would rx either that same 5-325 pill, or something else stronger than Tramadol, just enough to get to surery date. [facility] [place]. Please advise - pt only has Tramadol left Thanks. Please call on cell ph# I put in contact info.

## 2012-08-17 NOTE — Telephone Encounter (Signed)
Please advise on short tern rx for pain med until surgery.

## 2012-08-17 NOTE — Telephone Encounter (Signed)
Ok to RF vicodin  #60  RF 2

## 2012-08-24 ENCOUNTER — Telehealth: Payer: Self-pay | Admitting: Cardiovascular Disease

## 2012-08-24 ENCOUNTER — Encounter (HOSPITAL_BASED_OUTPATIENT_CLINIC_OR_DEPARTMENT_OTHER): Payer: Self-pay | Admitting: *Deleted

## 2012-08-24 NOTE — Telephone Encounter (Signed)
Spoke to [place] at [facility] office. Stated patient is having rt rotor cuff surgery 08/29/12 needs surgical clearance.Message sent to Dr.Nahser's nurse.

## 2012-08-24 NOTE — Telephone Encounter (Signed)
plz return call to [facility]  [phone] regarding 08/10/12 request for surgical clearance.

## 2012-08-25 ENCOUNTER — Encounter (HOSPITAL_BASED_OUTPATIENT_CLINIC_OR_DEPARTMENT_OTHER)
Admission: RE | Admit: 2012-08-25 | Discharge: 2012-08-25 | Disposition: A | Discharge status: Home / Self Care | Payer: Medicare Other | Source: Ambulatory Visit | Attending: Orthopedic Surgery | Admitting: Orthopedic Surgery

## 2012-08-25 LAB — BASIC METABOLIC PANEL
Calcium: 10.3 mg/dL (ref 8.4–10.5)
GFR calc Af Amer: >90 mL/min (ref >90)
GFR calc non Af Amer: 83 mL/min — ABNORMAL LOW (ref >90)
Potassium: 5.5 mEq/L — ABNORMAL HIGH (ref 3.5–5.1)
Sodium: 139 mEq/L (ref 135–145)

## 2012-08-25 NOTE — Telephone Encounter (Signed)
Dakota Green is at low risk for shoulder surgery.  He may hold his Plavix for 5 days prior to surgery.  He should make an apt to see me soon since it's been 1 year since we have seen him

## 2012-08-25 NOTE — Telephone Encounter (Signed)
Pt called and told to hold plavix, pt has f/u app already. Will fax this note to Surgeon.

## 2012-08-25 NOTE — Telephone Encounter (Signed)
sent msg to Dr Elease Hashimoto

## 2012-08-29 ENCOUNTER — Other Ambulatory Visit: Payer: Self-pay | Admitting: Physician Assistant

## 2012-08-29 ENCOUNTER — Encounter: Payer: Self-pay | Admitting: Physician Assistant

## 2012-08-29 ENCOUNTER — Encounter (HOSPITAL_BASED_OUTPATIENT_CLINIC_OR_DEPARTMENT_OTHER): Payer: Self-pay

## 2012-08-29 ENCOUNTER — Encounter (HOSPITAL_BASED_OUTPATIENT_CLINIC_OR_DEPARTMENT_OTHER): Payer: Self-pay | Admitting: Certified Registered"

## 2012-08-29 ENCOUNTER — Ambulatory Visit (HOSPITAL_BASED_OUTPATIENT_CLINIC_OR_DEPARTMENT_OTHER)
Admission: RE | Admit: 2012-08-29 | Discharge: 2012-08-29 | Disposition: A | Discharge status: Home / Self Care | Payer: Medicare Other | Source: Ambulatory Visit | Attending: Orthopedic Surgery | Admitting: Orthopedic Surgery

## 2012-08-29 ENCOUNTER — Ambulatory Visit (HOSPITAL_BASED_OUTPATIENT_CLINIC_OR_DEPARTMENT_OTHER): Payer: Medicare Other | Admitting: Certified Registered"

## 2012-08-29 ENCOUNTER — Encounter (HOSPITAL_BASED_OUTPATIENT_CLINIC_OR_DEPARTMENT_OTHER)
Admission: RE | Disposition: A | Discharge status: Home / Self Care | Payer: Self-pay | Source: Ambulatory Visit | Attending: Orthopedic Surgery

## 2012-08-29 DIAGNOSIS — K219 Gastro-esophageal reflux disease without esophagitis: Secondary | ICD-10-CM | POA: Diagnosis present

## 2012-08-29 DIAGNOSIS — M1990 Unspecified osteoarthritis, unspecified site: Secondary | ICD-10-CM

## 2012-08-29 DIAGNOSIS — Y998 Other external cause status: Secondary | ICD-10-CM | POA: Insufficient documentation

## 2012-08-29 DIAGNOSIS — M75100 Unspecified rotator cuff tear or rupture of unspecified shoulder, not specified as traumatic: Secondary | ICD-10-CM | POA: Diagnosis present

## 2012-08-29 DIAGNOSIS — S46819A Strain of other muscles, fascia and tendons at shoulder and upper arm level, unspecified arm, initial encounter: Secondary | ICD-10-CM | POA: Insufficient documentation

## 2012-08-29 DIAGNOSIS — Z01812 Encounter for preprocedural laboratory examination: Secondary | ICD-10-CM | POA: Insufficient documentation

## 2012-08-29 DIAGNOSIS — K279 Peptic ulcer, site unspecified, unspecified as acute or chronic, without hemorrhage or perforation: Secondary | ICD-10-CM | POA: Diagnosis present

## 2012-08-29 DIAGNOSIS — W19XXXA Unspecified fall, initial encounter: Secondary | ICD-10-CM | POA: Insufficient documentation

## 2012-08-29 DIAGNOSIS — I10 Essential (primary) hypertension: Secondary | ICD-10-CM | POA: Diagnosis present

## 2012-08-29 DIAGNOSIS — E785 Hyperlipidemia, unspecified: Secondary | ICD-10-CM | POA: Diagnosis present

## 2012-08-29 DIAGNOSIS — S43499A Other sprain of unspecified shoulder joint, initial encounter: Secondary | ICD-10-CM | POA: Insufficient documentation

## 2012-08-29 DIAGNOSIS — I2510 Atherosclerotic heart disease of native coronary artery without angina pectoris: Secondary | ICD-10-CM | POA: Diagnosis present

## 2012-08-29 DIAGNOSIS — S43429A Sprain of unspecified rotator cuff capsule, initial encounter: Secondary | ICD-10-CM | POA: Insufficient documentation

## 2012-08-29 DIAGNOSIS — Y92009 Unspecified place in unspecified non-institutional (private) residence as the place of occurrence of the external cause: Secondary | ICD-10-CM | POA: Insufficient documentation

## 2012-08-29 DIAGNOSIS — M25819 Other specified joint disorders, unspecified shoulder: Secondary | ICD-10-CM | POA: Insufficient documentation

## 2012-08-29 DIAGNOSIS — Z01810 Encounter for preprocedural cardiovascular examination: Secondary | ICD-10-CM | POA: Insufficient documentation

## 2012-08-29 HISTORY — DX: Acute myocardial infarction, unspecified: I21.9

## 2012-08-29 LAB — POCT HEMOGLOBIN-HEMACUE: Hemoglobin: 11.4 g/dL — ABNORMAL LOW (ref 13.0–17.0)

## 2012-08-29 SURGERY — SHOULDER ARTHROSCOPY WITH ROTATOR CUFF REPAIR AND SUBACROMIAL DECOMPRESSION
Anesthesia: Regional | Site: Shoulder | Laterality: Right | Wound class: Clean

## 2012-08-29 MED ORDER — CEFAZOLIN SODIUM-DEXTROSE 2-3 GM-% IV SOLR
2.0000 g | INTRAVENOUS | Status: AC
  Administered 2012-08-29: 2 g via INTRAVENOUS

## 2012-08-29 MED ORDER — LIDOCAINE HCL (CARDIAC) 20 MG/ML IV SOLN
INTRAVENOUS | Status: DC | PRN
  Administered 2012-08-29: 70 mg via INTRAVENOUS

## 2012-08-29 MED ORDER — SUCCINYLCHOLINE CHLORIDE 20 MG/ML IJ SOLN: INTRAMUSCULAR | Status: DC | PRN

## 2012-08-29 MED ORDER — EPHEDRINE SULFATE 50 MG/ML IJ SOLN
INTRAMUSCULAR | Status: DC | PRN
  Administered 2012-08-29: 10 mg via INTRAVENOUS
  Administered 2012-08-29: 5 mg via INTRAVENOUS

## 2012-08-29 MED ORDER — MIDAZOLAM HCL 2 MG/2ML IJ SOLN
1.0000 mg | INTRAMUSCULAR | Status: DC | PRN
  Administered 2012-08-29: 2 mg via INTRAVENOUS

## 2012-08-29 MED ORDER — BUPIVACAINE-EPINEPHRINE PF 0.5-1:200000 % IJ SOLN
INTRAMUSCULAR | Status: DC | PRN
  Administered 2012-08-29: 30 mL

## 2012-08-29 MED ORDER — FENTANYL CITRATE 0.05 MG/ML IJ SOLN
INTRAMUSCULAR | Status: DC | PRN
  Administered 2012-08-29: 50 ug via INTRAVENOUS

## 2012-08-29 MED ORDER — OXYCODONE HCL 5 MG PO TABS: ORAL_TABLET | ORAL | Status: DC

## 2012-08-29 MED ORDER — FENTANYL CITRATE 0.05 MG/ML IJ SOLN
50.0000 ug | INTRAMUSCULAR | Status: DC | PRN
  Administered 2012-08-29: 100 ug via INTRAVENOUS

## 2012-08-29 MED ORDER — SUCCINYLCHOLINE CHLORIDE 20 MG/ML IJ SOLN
INTRAMUSCULAR | Status: DC | PRN
  Administered 2012-08-29: 120 mg via INTRAVENOUS

## 2012-08-29 MED ORDER — PROPOFOL 10 MG/ML IV BOLUS: INTRAVENOUS | Status: DC | PRN

## 2012-08-29 MED ORDER — LACTATED RINGERS IV SOLN: INTRAVENOUS | Status: DC

## 2012-08-29 MED ORDER — LIDOCAINE HCL (CARDIAC) 20 MG/ML IV SOLN: INTRAVENOUS | Status: DC | PRN

## 2012-08-29 MED ORDER — LACTATED RINGERS IV SOLN
INTRAVENOUS | Status: DC
  Administered 2012-08-29 (×2): via INTRAVENOUS

## 2012-08-29 MED ORDER — PROPOFOL 10 MG/ML IV BOLUS
INTRAVENOUS | Status: DC | PRN
  Administered 2012-08-29: 160 mg via INTRAVENOUS

## 2012-08-29 MED ORDER — CHLORHEXIDINE GLUCONATE 4 % EX LIQD: 60.0000 mL | Freq: Once | CUTANEOUS | Status: DC

## 2012-08-29 MED ORDER — DEXAMETHASONE SODIUM PHOSPHATE 4 MG/ML IJ SOLN
INTRAMUSCULAR | Status: DC | PRN
  Administered 2012-08-29: 10 mg via INTRAVENOUS

## 2012-08-29 SURGICAL SUPPLY — 78 items
ANCH SUT 2 FT CRKSCW 14.7 STRL (Anchor) ×1 IMPLANT
ANCH SUT SWLK 19.1X5.5 CLS EL (Anchor) ×1 IMPLANT
ANCHOR CORKSCREW BIO 5.5 FT (Anchor) ×1 IMPLANT
ANCHOR PEEK SWIVEL LOCK 5.5 (Anchor) ×1 IMPLANT
APL SKNCLS STERI-STRIP NONHPOA (GAUZE/BANDAGES/DRESSINGS)
BENZOIN TINCTURE PRP APPL 2/3 (GAUZE/BANDAGES/DRESSINGS) IMPLANT
BLADE CUDA 5.5 (BLADE) IMPLANT
BLADE CUTTER GATOR 3.5 (BLADE) ×2 IMPLANT
BLADE GREAT WHITE 4.2 (BLADE) ×1 IMPLANT
BLADE SURG 15 STRL LF DISP TIS (BLADE) IMPLANT
BLADE SURG 15 STRL SS (BLADE)
BLADE SURG ROTATE 9660 (MISCELLANEOUS) IMPLANT
BNDG COHESIVE 4X5 TAN STRL (GAUZE/BANDAGES/DRESSINGS) ×2 IMPLANT
BUR OVAL 6.0 (BURR) ×2 IMPLANT
CANISTER OMNI JUG 16 LITER (MISCELLANEOUS) ×3 IMPLANT
CANISTER SUCTION 2500CC (MISCELLANEOUS) IMPLANT
CANNULA TWIST IN 8.25X7CM (CANNULA) IMPLANT
DECANTER SPIKE VIAL GLASS SM (MISCELLANEOUS) IMPLANT
DRAPE SHOULDER BEACH CHAIR (DRAPES) ×2 IMPLANT
DRAPE U-SHAPE 47X51 STRL (DRAPES) ×4 IMPLANT
DRSG PAD ABDOMINAL 8X10 ST (GAUZE/BANDAGES/DRESSINGS) ×2 IMPLANT
DURAPREP 26ML APPLICATOR (WOUND CARE) ×2 IMPLANT
ELECT REM PT RETURN 9FT ADLT (ELECTROSURGICAL) ×2
ELECTRODE REM PT RTRN 9FT ADLT (ELECTROSURGICAL) ×1 IMPLANT
GAUZE XEROFORM 1X8 LF (GAUZE/BANDAGES/DRESSINGS) ×2 IMPLANT
GLOVE BIO SURGEON STRL SZ7 (GLOVE) ×2 IMPLANT
GLOVE BIOGEL PI IND STRL 7.0 (GLOVE) IMPLANT
GLOVE BIOGEL PI IND STRL 7.5 (GLOVE) ×1 IMPLANT
GLOVE BIOGEL PI INDICATOR 7.0 (GLOVE) ×2
GLOVE BIOGEL PI INDICATOR 7.5 (GLOVE) ×1
GLOVE SS BIOGEL STRL SZ 7.5 (GLOVE) ×1 IMPLANT
GLOVE SUPERSENSE BIOGEL SZ 7.5 (GLOVE) ×2
GOWN PREVENTION PLUS XLARGE (GOWN DISPOSABLE) ×2 IMPLANT
NDL 1/2 CIR CATGUT .05X1.09 (NEEDLE) IMPLANT
NDL SAFETY ECLIPSE 18X1.5 (NEEDLE) ×1 IMPLANT
NDL SCORPION MULTI FIRE (NEEDLE) IMPLANT
NDL SUT 6 .5 CRC .975X.05 MAYO (NEEDLE) IMPLANT
NEEDLE 1/2 CIR CATGUT .05X1.09 (NEEDLE) IMPLANT
NEEDLE HYPO 18GX1.5 SHARP (NEEDLE)
NEEDLE MAYO TAPER (NEEDLE)
NEEDLE SCORPION MULTI FIRE (NEEDLE) ×2 IMPLANT
PACK ARTHROSCOPY DSU (CUSTOM PROCEDURE TRAY) ×2 IMPLANT
PACK BASIN DAY SURGERY FS (CUSTOM PROCEDURE TRAY) ×2 IMPLANT
PAD ALCOHOL SWAB (MISCELLANEOUS) ×2 IMPLANT
PENCIL BUTTON HOLSTER BLD 10FT (ELECTRODE) IMPLANT
SET ARTHROSCOPY TUBING (MISCELLANEOUS) ×2
SET ARTHROSCOPY TUBING LN (MISCELLANEOUS) ×1 IMPLANT
SHEET MEDIUM DRAPE 40X70 STRL (DRAPES) IMPLANT
SLEEVE SCD COMPRESS KNEE MED (MISCELLANEOUS) ×1 IMPLANT
SLING ARM FOAM STRAP LRG (SOFTGOODS) IMPLANT
SLING ARM FOAM STRAP MED (SOFTGOODS) IMPLANT
SLING ARM FOAM STRAP XLG (SOFTGOODS) IMPLANT
SLING ARM IMMOBILIZER MED (SOFTGOODS) IMPLANT
SLING ULTRA III MED (ORTHOPEDIC SUPPLIES) ×1 IMPLANT
SPONGE GAUZE 4X4 12PLY (GAUZE/BANDAGES/DRESSINGS) ×2 IMPLANT
SPONGE LAP 4X18 X RAY DECT (DISPOSABLE) IMPLANT
STRIP CLOSURE SKIN 1/2X4 (GAUZE/BANDAGES/DRESSINGS) IMPLANT
SUCTION FRAZIER TIP 10 FR DISP (SUCTIONS) IMPLANT
SUT ETHILON 3 0 PS 1 (SUTURE) ×2 IMPLANT
SUT FIBERWIRE #2 38 T-5 BLUE (SUTURE)
SUT PDS AB 2-0 CT2 27 (SUTURE) IMPLANT
SUT PROLENE 3 0 PS 2 (SUTURE) IMPLANT
SUT TIGER TAPE 7 IN WHITE (SUTURE) IMPLANT
SUT VIC AB 0 SH 27 (SUTURE) IMPLANT
SUT VIC AB 2-0 PS2 27 (SUTURE) IMPLANT
SUT VIC AB 2-0 SH 27 (SUTURE)
SUT VIC AB 2-0 SH 27XBRD (SUTURE) IMPLANT
SUTURE FIBERWR #2 38 T-5 BLUE (SUTURE) IMPLANT
SYR 20CC LL (SYRINGE) IMPLANT
SYR 5ML LL (SYRINGE) ×1 IMPLANT
SYR BULB 3OZ (MISCELLANEOUS) IMPLANT
TAPE FIBER 2MM 7IN #2 BLUE (SUTURE) IMPLANT
TAPE HYPAFIX 6X30 (GAUZE/BANDAGES/DRESSINGS) IMPLANT
TAPE STRIPS DRAPE STRL (GAUZE/BANDAGES/DRESSINGS) ×2 IMPLANT
TOWEL OR 17X24 6PK STRL BLUE (TOWEL DISPOSABLE) ×2 IMPLANT
TUBE CONNECTING 20X1/4 (TUBING) IMPLANT
WAND STAR VAC 90 (SURGICAL WAND) ×2 IMPLANT
WATER STERILE IRR 1000ML POUR (IV SOLUTION) ×1 IMPLANT

## 2012-08-29 NOTE — Progress Notes (Signed)
Assisted Dr. Hodierne with right, ultrasound guided, interscalene  block. Side rails up, monitors on throughout procedure. See vital signs in flow sheet. Tolerated Procedure well. 

## 2012-08-29 NOTE — H&P (Signed)
Dakota Green is an 78 y.o. male.   Chief Complaint: right shoulder rotator cuff tear HPI: Dakota Green is a 78 year old seen at the request of Dr. Voytek for significant right shoulder injury that occurred 3 weeks ago when he fell in the yard. Since then he's been unable to raise his arm. He has significant pain. He had an MRI of the right shoulder on 07/19/12 that revealed a full thickness supraspinatus tear with mild edema and a partial subscap tear and biceps tendon rupture with impingement and AC joint arthropathy. He's having pain which has not responded to conservative treatment. He sees Dr. Nasher for cardiology care and Dr. Kwiatkowski is his primary care physician.  Past Medical History  Diagnosis Date  . ALLERGIC RHINITIS 05/04/2007  . CORONARY ARTERY DISEASE 05/04/2007  . DIVERTICULOSIS, COLON 08/14/2009  . ESOPHAGEAL STRICTURE 08/14/2009  . GERD 05/04/2007  . HIATAL HERNIA 08/14/2009  . HYPERLIPIDEMIA 05/04/2007  . HYPERTENSION 05/04/2007  . Osteoarth NOS-Unspec 05/04/2007  . PEPTIC ULCER DISEASE 05/04/2007    bleeding ulcer with hospitalization  . DJD (degenerative joint disease)   . Myocardial infarction 1987  . Torn rotator cuff     right    Past Surgical History  Procedure Date  . Coronary artery bypass graft   . Cataract extraction   . Coronary angioplasty with stent placement   . Cardiac catheterization 06/12/2007    EF 45%  . Cardiac catheterization 01/18/2005    EF 50-55%  . Cardiac catheterization 01/14/2003    EF 55%  . Us echocardiography 08/23/2005    EF 50-55%  . Cardiovascular stress test 02/07/2001    Family History  Problem Relation Age of Onset  . Heart attack Mother    Social History:  reports that he quit smoking about 26 years ago. He has never used smokeless tobacco. He reports that he drinks alcohol. He reports that he does not use illicit drugs.  Allergies:  Allergies  Allergen Reactions  . Crestor (Rosuvastatin Calcium)     Aches      (Not in a  hospital admission)  No results found for this or any previous visit (from the past 48 hour(s)). No results found.  Review of Systems  Constitutional: Negative.   HENT: Negative.   Eyes: Negative.   Respiratory: Negative.   Cardiovascular: Negative.   Gastrointestinal: Negative.   Genitourinary: Negative.   Musculoskeletal: Positive for joint pain.       Right shoulder pain  Skin: Negative.   Neurological: Negative.   Endo/Heme/Allergies: Negative.   Psychiatric/Behavioral: Negative.     There were no vitals taken for this visit. Physical Exam  Constitutional: He is oriented to person, place, and time. He appears well-developed and well-nourished.  HENT:  Head: Normocephalic.  Eyes: EOM are normal.  Neck: Neck supple.  Cardiovascular: Normal rate.   Respiratory: Effort normal.  GI: Soft.  Genitourinary:       Not pertinent to current symptomatology therefore not examined.  Musculoskeletal:       Examination of his right shoulder reveals forward flexion of 160 with pain and weakness abduction of 140 degrees with pain and weakness, internal and external rotation of 70 degrees with pain and weakness no instability. Exam of his left shoulder reveals full range of motion without pain weakness or instability. Vascular exam: pulses 2+ and symmetric.  Neurological: He is alert and oriented to person, place, and time.  Skin: Skin is warm and dry.  Psychiatric: He has a   normal mood and affect. His behavior is normal. Thought content normal.     Assessment Patient Active Problem List  Diagnosis  . HYPERLIPIDEMIA  . HYPERTENSION  . CORONARY ARTERY DISEASE  . ALLERGIC RHINITIS  . ESOPHAGEAL STRICTURE  . GERD  . PEPTIC ULCER DISEASE  . HIATAL HERNIA  . DIVERTICULOSIS, COLON  . Osteoarth NOS-Unspec  . Lumbar spinal stenosis  . Cervical pain  . Torn rotator cuff  . DJD (degenerative joint disease)    Plan I talk to him and his wife about this in detail. Would recommend we  proceed with right shoulder arthroscopy with rotator cuff repair with subacromial decompression and distal clavicle excision. Discussed risks benefits and possible complications of the surgery in detail and they understand this completely. We'll plan on setting him up for this at some point in the near future. I spoke to Dr. Nasher concerning Dakota Green's cardiac clearance. Dr. Nasher did not feel this would be a problem for him to have arthroscopy at [facility]. He can come off Plavix 5 days ahead of time but stay on aspirin. He will be available if necessary but he does not feel that Dakota Green needs to be watched overnight if he's stable during surgery.   Yolonda Purtle J 08/29/2012, 11:06 AM    

## 2012-08-29 NOTE — H&P (View-Only) (Signed)
Dakota Green is an 78 y.o. male.   Chief Complaint: right shoulder rotator cuff tear HPI: Dakota Green is a 78 year old seen at the request of Dr. Charlett Blake for significant right shoulder injury that occurred 3 weeks ago when he fell in the yard. Since then he's been unable to raise his arm. He has significant pain. He had an MRI of the right shoulder on 07/19/12 that revealed a full thickness supraspinatus tear with mild edema and a partial subscap tear and biceps tendon rupture with impingement and AC joint arthropathy. He's having pain which has not responded to conservative treatment. He sees Dr. Melburn Popper for cardiology care and Dr. Amador Cunas is his primary care physician.  Past Medical History  Diagnosis Date  . ALLERGIC RHINITIS 05/04/2007  . CORONARY ARTERY DISEASE 05/04/2007  . DIVERTICULOSIS, COLON 08/14/2009  . ESOPHAGEAL STRICTURE 08/14/2009  . GERD 05/04/2007  . HIATAL HERNIA 08/14/2009  . HYPERLIPIDEMIA 05/04/2007  . HYPERTENSION 05/04/2007  . Osteoarth NOS-Unspec 05/04/2007  . PEPTIC ULCER DISEASE 05/04/2007    bleeding ulcer with hospitalization  . DJD (degenerative joint disease)   . Myocardial infarction 1987  . Torn rotator cuff     right    Past Surgical History  Procedure Date  . Coronary artery bypass graft   . Cataract extraction   . Coronary angioplasty with stent placement   . Cardiac catheterization 06/12/2007    EF 45%  . Cardiac catheterization 01/18/2005    EF 50-55%  . Cardiac catheterization 01/14/2003    EF 55%  . US echocardiography 08/23/2005    EF 50-55%  . Cardiovascular stress test 02/07/2001    Family History  Problem Relation Age of Onset  . Heart attack Mother    Social History:  reports that he quit smoking about 26 years ago. He has never used smokeless tobacco. He reports that he drinks alcohol. He reports that he does not use illicit drugs.  Allergies:  Allergies  Allergen Reactions  . Crestor (Rosuvastatin Calcium)     Aches      (Not in a  hospital admission)  No results found for this or any previous visit (from the past 48 hour(s)). No results found.  Review of Systems  Constitutional: Negative.   HENT: Negative.   Eyes: Negative.   Respiratory: Negative.   Cardiovascular: Negative.   Gastrointestinal: Negative.   Genitourinary: Negative.   Musculoskeletal: Positive for joint pain.       Right shoulder pain  Skin: Negative.   Neurological: Negative.   Endo/Heme/Allergies: Negative.   Psychiatric/Behavioral: Negative.     There were no vitals taken for this visit. Physical Exam  Constitutional: He is oriented to person, place, and time. He appears well-developed and well-nourished.  HENT:  Head: Normocephalic.  Eyes: EOM are normal.  Neck: Neck supple.  Cardiovascular: Normal rate.   Respiratory: Effort normal.  GI: Soft.  Genitourinary:       Not pertinent to current symptomatology therefore not examined.  Musculoskeletal:       Examination of his right shoulder reveals forward flexion of 160 with pain and weakness abduction of 140 degrees with pain and weakness, internal and external rotation of 70 degrees with pain and weakness no instability. Exam of his left shoulder reveals full range of motion without pain weakness or instability. Vascular exam: pulses 2+ and symmetric.  Neurological: He is alert and oriented to person, place, and time.  Skin: Skin is warm and dry.  Psychiatric: He has a  normal mood and affect. His behavior is normal. Thought content normal.     Assessment Patient Active Problem List  Diagnosis  . HYPERLIPIDEMIA  . HYPERTENSION  . CORONARY ARTERY DISEASE  . ALLERGIC RHINITIS  . ESOPHAGEAL STRICTURE  . GERD  . PEPTIC ULCER DISEASE  . HIATAL HERNIA  . DIVERTICULOSIS, COLON  . Osteoarth NOS-Unspec  . Lumbar spinal stenosis  . Cervical pain  . Torn rotator cuff  . DJD (degenerative joint disease)    Plan I talk to him and his wife about this in detail. Would recommend we  proceed with right shoulder arthroscopy with rotator cuff repair with subacromial decompression and distal clavicle excision. Discussed risks benefits and possible complications of the surgery in detail and they understand this completely. We'll plan on setting him up for this at some point in the near future. I spoke to Dr. Melburn Popper concerning Dakota Green's cardiac clearance. Dr. Melburn Popper did not feel this would be a problem for him to have arthroscopy at [facility]. He can come off Plavix 5 days ahead of time but stay on aspirin. He will be available if necessary but he does not feel that Dakota Green needs to be watched overnight if he's stable during surgery.   Caretha Rumbaugh J 08/29/2012, 11:06 AM

## 2012-08-29 NOTE — Interval H&P Note (Signed)
History and Physical Interval Note:  08/29/2012 12:11 PM  Dakota Green  has presented today for surgery, with the diagnosis of right shoulder impingement, arthritis, rc rupture  The various methods of treatment have been discussed with the patient and family. After consideration of risks, benefits and other options for treatment, the patient has consented to  Procedure(s) (LRB) with comments: SHOULDER ARTHROSCOPY WITH ROTATOR CUFF REPAIR AND SUBACROMIAL DECOMPRESSION (Right) - right shoulder scope, sad, dce, rtc repair as a surgical intervention .  The patient's history has been reviewed, patient examined, no change in status, stable for surgery.  I have reviewed the patient's chart and labs.  Questions were answered to the patient's satisfaction.     [name] A

## 2012-08-29 NOTE — Anesthesia Procedure Notes (Addendum)
Anesthesia Regional Block:  Interscalene brachial plexus block  Pre-Anesthetic Checklist: ,, timeout performed, Correct Patient, Correct Site, Correct Laterality, Correct Procedure, Correct Position, site marked, Risks and benefits discussed,  Surgical consent,  Pre-op evaluation,  At surgeon's request and post-op pain management  Laterality: Right  Prep: chloraprep       Needles:  Injection technique: Single-shot  Needle Type: Echogenic Stimulator Needle     Needle Length: 5cm 5 cm Needle Gauge: 22 and 22 G    Additional Needles:  Procedures: ultrasound guided and nerve stimulator Interscalene brachial plexus block  Nerve Stimulator or Paresthesia:  Response: biceps flexion, 0.45 mA,   Additional Responses:   Narrative:  Start time: 08/29/2012 11:40 AM End time: 08/29/2012 11:57 AM Injection made incrementally with aspirations every 5 mL.  Performed by: Personally  Anesthesiologist: Dr Chaney Malling  Additional Notes: Functioning IV was confirmed and monitors were applied.  A 50mm 22ga Arrow echogenic stimulator needle was used. Sterile prep and drape,hand hygiene and sterile gloves were used.  Negative aspiration and negative test dose prior to incremental administration of local anesthetic. The patient tolerated the procedure well.  Ultrasound guidance: relevent anatomy identified, needle position confirmed, local anesthetic spread visualized around nerve(s), vascular puncture avoided.  Image printed for medical record.   Interscalene brachial plexus block Procedure Name: Intubation Date/Time: 08/29/2012 12:30 PM Performed by: Verlan Friends Pre-anesthesia Checklist: Patient identified, Emergency Drugs available, Suction available, Patient being monitored and Timeout performed Patient Re-evaluated:Patient Re-evaluated prior to inductionOxygen Delivery Method: Circle System Utilized Preoxygenation: Pre-oxygenation with 100% oxygen Intubation Type: IV  induction Ventilation: Mask ventilation without difficulty Laryngoscope Size: Miller and 3 Grade View: Grade II Tube type: Oral Tube size: 8.0 mm Number of attempts: 1 Airway Equipment and Method: stylet and oral airway Placement Confirmation: ETT inserted through vocal cords under direct vision,  positive ETCO2 and breath sounds checked- equal and bilateral Secured at: 21 cm Tube secured with: Tape Dental Injury: Teeth and Oropharynx as per pre-operative assessment

## 2012-08-29 NOTE — Anesthesia Postprocedure Evaluation (Signed)
Anesthesia Post Note  Patient: Dakota Green  Procedure(s) Performed: Procedure(s) (LRB): SHOULDER ARTHROSCOPY WITH ROTATOR CUFF REPAIR AND SUBACROMIAL DECOMPRESSION (Right)  Anesthesia type: General  Patient location: PACU  Post pain: Pain level controlled and Adequate analgesia  Post assessment: Post-op Vital signs reviewed, Patient's Cardiovascular Status Stable, Respiratory Function Stable, Patent Airway and Pain level controlled  Last Vitals:  Filed Vitals:   08/29/12 1445  BP: 128/68  Pulse: 60  Temp:   Resp: 11    Post vital signs: Reviewed and stable  Level of consciousness: awake, alert  and oriented  Complications: No apparent anesthesia complications

## 2012-08-29 NOTE — Transfer of Care (Signed)
Immediate Anesthesia Transfer of Care Note  Patient: Dakota Green  Procedure(s) Performed: Procedure(s) (LRB) with comments: SHOULDER ARTHROSCOPY WITH ROTATOR CUFF REPAIR AND SUBACROMIAL DECOMPRESSION (Right) - right shoulder scope, subacromial decompression with distal clavicle resection and rotator cuff repair  Patient Location: PACU  Anesthesia Type: GA combined with regional for post-op pain  Level of Consciousness: awake, alert , oriented and patient cooperative  Airway & Oxygen Therapy: Patient Spontanous Breathing and Patient connected to face mask oxygen  Post-op Assessment: Report given to PACU RN and Post -op Vital signs reviewed and stable  Post vital signs: Reviewed and stable  Complications: No apparent anesthesia complications

## 2012-08-29 NOTE — Anesthesia Preprocedure Evaluation (Signed)
Anesthesia Evaluation  Patient identified by MRN, date of birth, ID band Patient awake    Reviewed: Allergy & Precautions, H&P , NPO status , Patient's Chart, lab work & pertinent test results  Airway Mallampati: II  Neck ROM: full    Dental   Pulmonary          Cardiovascular hypertension, + CAD and + Peripheral Vascular Disease     Neuro/Psych  Neuromuscular disease    GI/Hepatic PUD, GERD-  ,  Endo/Other    Renal/GU      Musculoskeletal  (+) Arthritis -,   Abdominal   Peds  Hematology   Anesthesia Other Findings   Reproductive/Obstetrics                           Anesthesia Physical Anesthesia Plan  ASA: III  Anesthesia Plan: General and Regional   Post-op Pain Management:    Induction: Intravenous  Airway Management Planned: Oral ETT  Additional Equipment:   Intra-op Plan:   Post-operative Plan: Extubation in OR  Informed Consent: I have reviewed the patients History and Physical, chart, labs and discussed the procedure including the risks, benefits and alternatives for the proposed anesthesia with the patient or authorized representative who has indicated his/her understanding and acceptance.     Plan Discussed with: CRNA and Surgeon  Anesthesia Plan Comments:         Anesthesia Quick Evaluation

## 2012-08-30 NOTE — Op Note (Deleted)
NAME:  Dakota Green, Dakota Green                 ACCOUNT NO.:  [id number]  MEDICAL RECORD NO.:  [id number]  LOCATION:  [id number]                         FACILITY:  [facility]  PHYSICIAN:  Indio Santilli A. Debora Stockdale, M.D. DATE OF BIRTH:  09/17/1933  DATE OF PROCEDURE:  08/29/2012 DATE OF DISCHARGE:  07/23/2012                              OPERATIVE REPORT   PREOPERATIVE DIAGNOSES: 1. Right shoulder rotator cuff tear. 2. Right shoulder biceps tendon rupture. 3. Right shoulder partial labrum tear. 4. Right shoulder impingement.  POSTOPERATIVE DIAGNOSES: 1. Right shoulder rotator cuff tear. 2. Right shoulder biceps tendon rupture. 3. Right shoulder partial labrum tear. 4. Right shoulder impingement.  PROCEDURE: 1. Right shoulder EUA followed by arthroscopically-assisted rotator     cuff repair using Arthrex swivel lock anchor x1 and corkscrew     anchor x1. 2. Right shoulder debridement biceps tendon rupture and debridement of     partial labrum tear. 3. Right shoulder subacromial decompression.  SURGEON:  Morissa Obeirne A. Kaelan Emami, M.D.  ASSISTANT:  Kirstin Shepperson, PA-C.  ANESTHESIA:  General.  OPERATIVE TIME:  1 hour and 10 minutes.  COMPLICATIONS:  None.  INDICATION FOR PROCEDURE:  Dakota Green is a 78-year-old gentleman who injured his right shoulder in a fall approximately 6 weeks ago.  Exam and MRI has revealed a complete acute rotator cuff tear with biceps tendon rupture.  He is now to undergo arthroscopy and repair due to severe pain.  DESCRIPTION:  Dakota Green was brought to the operating room on August 29, 2012, after an interscalene block was placed in the holding room by Anesthesia.  He was placed on the operating table in a supine position. He received Ancef 2 g IV preoperatively for prophylaxis.  After being placed under general anesthesia, his right shoulder was examined.  He had full range of motion.  Shoulder was stable to ligamentous exam.  He was then placed in beach-chair position and  shoulder and arm was prepped using sterile DuraPrep and draped using sterile technique.  Time-out procedure was called and the correct right shoulder identified. Initially, through a posterior arthroscopic portal, the arthroscope with a pump attached was placed into an anterior portal, and an arthroscopic probe was placed.  On initial inspection, the articular cartilage in the glenohumeral joint showed 25% grade 3 chondromalacia, which was debrided.  He had partial tearing of the anterior and posterior labrum 25% which was debrided.  The anterior-inferior labrum and anterior- inferior glenohumeral ligament complex was intact.  There was a large stump of the biceps tendon impinging into the joint and this was thoroughly resected down to a stable base.  The rotator cuff was inspected.  He had a complete tear of the supraspinatus and the infraspinatus.  The rest of the rotator cuff was intact.  Inferior capsular recess free of pathology.  Subacromial space was entered and a lateral arthroscopic portal was made.  Moderately thickened bursitis was resected.  Impingement was noted and a subacromial decompression was carried out as well as CA ligament release removing 6-8 mm of the undersurface of the anterolateral, anteromedial acromion and CA ligament release carried out as well.  The AC joint was not pathologic and thus was   not resected.  The rotator cuff tear was well visualized.  It was further partially debrided arthroscopically and then through 2 lateral accessory portals, the rotator cuff was repaired primarily with the anterior portion of the cuff and the supraspinatus repaired with 1 fiber tape with a mattress suture technique through a swivel lock and the posterior portion of the tear of the supraspinatus repaired with an Arthrex corkscrew with each of the sutures in this corkscrew being placed with a mattress suture technique through the infraspinatus and tying down arthroscopically  with firm and tight fixation and 1 of these sutures added to the swivel lock and deployed in the lateral aspect of the greater tuberosity with firm and tight fixation.  After this was done, there was found to be excellent stability of the repair and the shoulder could be brought through a satisfactory range of motion with no impingement on the repair.  At this point, felt that all pathology had been satisfactorily addressed.  The instruments were removed.  Portals were closed with 3-0 nylon suture.  Sterile dressings were applied and a sling.  The patient was awakened and taken to the recovery room in a stable condition.  FOLLOWUP CARE:  Dakota Green will be followed as an outpatient on hydrocodone and Valium with early physical therapy.  He will be seen back in the office in a week for sutures out and followup.     Jordin Dambrosio A. Vivianna Piccini, M.D.     RAW/MEDQ  D:  08/29/2012  T:  08/30/2012  Job:  [id number] 

## 2012-08-30 NOTE — Op Note (Signed)
NAMEMarland Kitchen  ROBERTANTHONY, Dakota Green NO.:  [id number]  MEDICAL RECORD NO.:  [id number]  LOCATION:  WTR6                         FACILITY:  [facility]  PHYSICIAN:  Elana Alm. Thurston Hole, M.D. DATE OF BIRTH:  [birth year]  DATE OF PROCEDURE:  08/29/2012 DATE OF DISCHARGE:  07/23/2012                              OPERATIVE REPORT   PREOPERATIVE DIAGNOSES: 1. Right shoulder rotator cuff tear. 2. Right shoulder biceps tendon rupture. 3. Right shoulder partial labrum tear. 4. Right shoulder impingement.  POSTOPERATIVE DIAGNOSES: 1. Right shoulder rotator cuff tear. 2. Right shoulder biceps tendon rupture. 3. Right shoulder partial labrum tear. 4. Right shoulder impingement.  PROCEDURE: 1. Right shoulder EUA followed by arthroscopically-assisted rotator     cuff repair using Arthrex swivel lock anchor x1 and corkscrew     anchor x1. 2. Right shoulder debridement biceps tendon rupture and debridement of     partial labrum tear. 3. Right shoulder subacromial decompression.  SURGEON:  Elana Alm. Thurston Hole, M.D.  ASSISTANT:  Kirstin Shepperson, PA-C.  ANESTHESIA:  General.  OPERATIVE TIME:  1 hour and 10 minutes.  COMPLICATIONS:  None.  INDICATION FOR PROCEDURE:  [name] is a 78 year old gentleman who injured his right shoulder in a fall approximately 6 weeks ago.  Exam and MRI has revealed a complete acute rotator cuff tear with biceps tendon rupture.  He is now to undergo arthroscopy and repair due to severe pain.  DESCRIPTION:  [name] was brought to the operating room on August 29, 2012, after an interscalene block was placed in the holding room by Anesthesia.  He was placed on the operating table in a supine position. He received Ancef 2 g IV preoperatively for prophylaxis.  After being placed under general anesthesia, his right shoulder was examined.  He had full range of motion.  Shoulder was stable to ligamentous exam.  He was then placed in beach-chair position and  shoulder and arm was prepped using sterile DuraPrep and draped using sterile technique.  Time-out procedure was called and the correct right shoulder identified. Initially, through a posterior arthroscopic portal, the arthroscope with a pump attached was placed into an anterior portal, and an arthroscopic probe was placed.  On initial inspection, the articular cartilage in the glenohumeral joint showed 25% grade 3 chondromalacia, which was debrided.  He had partial tearing of the anterior and posterior labrum 25% which was debrided.  The anterior-inferior labrum and anterior- inferior glenohumeral ligament complex was intact.  There was a large stump of the biceps tendon impinging into the joint and this was thoroughly resected down to a stable base.  The rotator cuff was inspected.  He had a complete tear of the supraspinatus and the infraspinatus.  The rest of the rotator cuff was intact.  Inferior capsular recess free of pathology.  Subacromial space was entered and a lateral arthroscopic portal was made.  Moderately thickened bursitis was resected.  Impingement was noted and a subacromial decompression was carried out as well as CA ligament release removing 6-8 mm of the undersurface of the anterolateral, anteromedial acromion and CA ligament release carried out as well.  The [facility] joint was not pathologic and thus was  not resected.  The rotator cuff tear was well visualized.  It was further partially debrided arthroscopically and then through 2 lateral accessory portals, the rotator cuff was repaired primarily with the anterior portion of the cuff and the supraspinatus repaired with 1 fiber tape with a mattress suture technique through a swivel lock and the posterior portion of the tear of the supraspinatus repaired with an Arthrex corkscrew with each of the sutures in this corkscrew being placed with a mattress suture technique through the infraspinatus and tying down arthroscopically  with firm and tight fixation and 1 of these sutures added to the swivel lock and deployed in the lateral aspect of the greater tuberosity with firm and tight fixation.  After this was done, there was found to be excellent stability of the repair and the shoulder could be brought through a satisfactory range of motion with no impingement on the repair.  At this point, felt that all pathology had been satisfactorily addressed.  The instruments were removed.  Portals were closed with 3-0 nylon suture.  Sterile dressings were applied and a sling.  The patient was awakened and taken to the recovery room in a stable condition.  FOLLOWUP CARE:  [name] will be followed as an outpatient on hydrocodone and Valium with early physical therapy.  He will be seen back in the office in a week for sutures out and followup.     [name], M.D.     RAW/MEDQ  D:  08/29/2012  T:  08/30/2012  Job:  [id number]

## 2012-08-31 ENCOUNTER — Ambulatory Visit: Payer: Medicare Other | Attending: Orthopedic Surgery | Admitting: Physical Therapy

## 2012-08-31 ENCOUNTER — Ambulatory Visit: Payer: Medicare Other | Admitting: Cardiovascular Disease

## 2012-08-31 DIAGNOSIS — M25519 Pain in unspecified shoulder: Secondary | ICD-10-CM | POA: Insufficient documentation

## 2012-08-31 DIAGNOSIS — M25619 Stiffness of unspecified shoulder, not elsewhere classified: Secondary | ICD-10-CM | POA: Insufficient documentation

## 2012-08-31 DIAGNOSIS — IMO0001 Reserved for inherently not codable concepts without codable children: Secondary | ICD-10-CM | POA: Insufficient documentation

## 2012-08-31 DIAGNOSIS — R5381 Other malaise: Secondary | ICD-10-CM | POA: Insufficient documentation

## 2012-09-04 ENCOUNTER — Ambulatory Visit: Payer: Medicare Other | Admitting: Physical Therapy

## 2012-09-06 ENCOUNTER — Ambulatory Visit (INDEPENDENT_AMBULATORY_CARE_PROVIDER_SITE_OTHER): Payer: Medicare Other | Admitting: Internal Medicine

## 2012-09-06 ENCOUNTER — Encounter: Payer: Self-pay | Admitting: Internal Medicine

## 2012-09-06 ENCOUNTER — Telehealth: Payer: Self-pay | Admitting: Internal Medicine

## 2012-09-06 VITALS — BP 110/70 | Temp 97.9°F | Wt 168.0 lb

## 2012-09-06 DIAGNOSIS — S43429A Sprain of unspecified rotator cuff capsule, initial encounter: Secondary | ICD-10-CM

## 2012-09-06 DIAGNOSIS — I10 Essential (primary) hypertension: Secondary | ICD-10-CM

## 2012-09-06 DIAGNOSIS — R609 Edema, unspecified: Secondary | ICD-10-CM

## 2012-09-06 DIAGNOSIS — M75100 Unspecified rotator cuff tear or rupture of unspecified shoulder, not specified as traumatic: Secondary | ICD-10-CM

## 2012-09-06 DIAGNOSIS — I2510 Atherosclerotic heart disease of native coronary artery without angina pectoris: Secondary | ICD-10-CM

## 2012-09-06 DIAGNOSIS — M1990 Unspecified osteoarthritis, unspecified site: Secondary | ICD-10-CM

## 2012-09-06 DIAGNOSIS — R600 Localized edema: Secondary | ICD-10-CM

## 2012-09-06 NOTE — Telephone Encounter (Signed)
Caller: Judy/Spouse; Patient Name: Dakota Green; PCP: Eleonore Chiquito [facility]); Best Callback Phone Number: [phone]; Reason for call: She states her husband recently had Rotator Cuff Surgery one week ago on 08/29/12. Sutures removed yesterday.  They noticed his feet /legs swelling on Thursday , 08/31/12.  Feet are elevated and sitting in recliner and normally not swollen. Noted line around leg from socks.   No shortness of breath. He is a very active man per wife. Emergent s/sx ruled out per Edema Atraumatic Protocol with the exception of "new swelling of legs that does not resolve with rest and elevation". See Provider in 24 hours. Understanding expressed. Appointment scheduled at 10:45 today 09/06/12 with Dr. Amador Cunas MD . Wife will bring husband in for evaluation

## 2012-09-06 NOTE — Patient Instructions (Signed)
Limit your sodium (Salt) intake  Keep legs elevated as much as possible  Call or return to clinic prn if these symptoms worsen or fail to improve as anticipated.  

## 2012-09-06 NOTE — Progress Notes (Signed)
Subjective:    Patient ID: Dakota Green, male    DOB: 1933-05-26, 78 y.o.   MRN: [id number]  HPI  78 year old patient who underwent right rotator cuff surgery about 8 days ago. He has a history of coronary artery disease but no history of congestive heart failure the past few days she has had some very mild lower extremity edema the left greater than the right. Denies any cardiopulmonary complaints. He is receiving frequent rehabilitation. Denies any chest pain. Medical regimen includes low-dose prednisone for inflammatory osteoarthritis \ Past Medical History  Diagnosis Date  . ALLERGIC RHINITIS 05/04/2007  . CORONARY ARTERY DISEASE 05/04/2007  . DIVERTICULOSIS, COLON 08/14/2009  . ESOPHAGEAL STRICTURE 08/14/2009  . GERD 05/04/2007  . HIATAL HERNIA 08/14/2009  . HYPERLIPIDEMIA 05/04/2007  . HYPERTENSION 05/04/2007  . Osteoarth NOS-Unspec 05/04/2007  . PEPTIC ULCER DISEASE 05/04/2007    bleeding ulcer with hospitalization  . DJD (degenerative joint disease)   . Myocardial infarction 1987  . Torn rotator cuff     right    History   Social History  . Marital Status: Married    Spouse Name: N/A    Number of Children: N/A  . Years of Education: N/A   Occupational History  . Not on file.   Social History Main Topics  . Smoking status: Former Smoker    Quit date: 11/29/1985  . Smokeless tobacco: Never Used  . Alcohol Use: Yes     occ  . Drug Use: No  . Sexually Active: Not on file   Other Topics Concern  . Not on file   Social History Narrative  . No narrative on file    Past Surgical History  Procedure Date  . Coronary artery bypass graft   . Cataract extraction   . Coronary angioplasty with stent placement   . Cardiac catheterization 06/12/2007    EF 45%  . Cardiac catheterization 01/18/2005    EF 50-55%  . Cardiac catheterization 01/14/2003    EF 55%  . US echocardiography 08/23/2005    EF 50-55%  . Cardiovascular stress test 02/07/2001    Family History  Problem Relation  Age of Onset  . Heart attack Mother     Allergies  Allergen Reactions  . Crestor (Rosuvastatin Calcium)     Aches     Current Outpatient Prescriptions on File Prior to Visit  Medication Sig Dispense Refill  . aspirin 81 MG tablet Take 81 mg by mouth daily.        . benazepril (LOTENSIN) 10 MG tablet Take 1 tablet (10 mg total) by mouth daily.  90 tablet  2  . clopidogrel (PLAVIX) 75 MG tablet Take 1 tablet (75 mg total) by mouth daily.  90 tablet  6  . Desoximetasone 0.05 % GEL Apply topically 2 (two) times daily as needed.        Marland Kitchen esomeprazole (NEXIUM) 40 MG capsule Take 1 capsule (40 mg total) by mouth daily before breakfast.  90 capsule  6  . fish oil-omega-3 fatty acids 1000 MG capsule Take 1,000 mg by mouth 3 (three) times daily.        Marland Kitchen loratadine (CLARITIN) 10 MG tablet Take 10 mg by mouth daily.        . Multiple Vitamins-Minerals (PRESERVISION/LUTEIN PO) Take by mouth daily.        . predniSONE (DELTASONE) 5 MG tablet Take 1 tablet (5 mg total) by mouth daily.  90 tablet  3  . temazepam (RESTORIL) 30 MG  capsule Take 1 capsule (30 mg total) by mouth at bedtime as needed.  30 capsule  3    BP 110/70  Temp 97.9 F (36.6 C) (Oral)  Wt 168 lb (76.204 kg)  SpO2 97%       Review of Systems  Constitutional: Negative for fever, chills, appetite change and fatigue.  HENT: Negative for hearing loss, ear pain, congestion, sore throat, trouble swallowing, neck stiffness, dental problem, voice change and tinnitus.   Eyes: Negative for pain, discharge and visual disturbance.  Respiratory: Negative for cough, chest tightness, wheezing and stridor.   Cardiovascular: Positive for leg swelling. Negative for chest pain and palpitations.  Gastrointestinal: Negative for nausea, vomiting, abdominal pain, diarrhea, constipation, blood in stool and abdominal distention.  Genitourinary: Negative for urgency, hematuria, flank pain, discharge, difficulty urinating and genital sores.    Musculoskeletal: Positive for arthralgias (Status post recent right rotator cuff surgery). Negative for myalgias, back pain, joint swelling and gait problem.  Skin: Negative for rash.  Neurological: Negative for dizziness, syncope, speech difficulty, weakness, numbness and headaches.  Hematological: Negative for adenopathy. Does not bruise/bleed easily.  Psychiatric/Behavioral: Negative for behavioral problems and dysphoric mood. The patient is not nervous/anxious.        Objective:   Physical Exam  Constitutional: He is oriented to person, place, and time. He appears well-developed and well-nourished. No distress.  HENT:  Head: Normocephalic.  Right Ear: External ear normal.  Left Ear: External ear normal.  Eyes: Conjunctivae normal and EOM are normal.  Neck: Normal range of motion. No JVD present.  Cardiovascular: Normal rate, regular rhythm and normal heart sounds.   Pulmonary/Chest: Effort normal and breath sounds normal. No respiratory distress. He has no wheezes. He has no rales.       O2 saturation 97%  Abdominal: Bowel sounds are normal.  Musculoskeletal: Normal range of motion. He exhibits edema. He exhibits no tenderness.       Very  mild the peripheral edema  With  some puffiness of the left foot. Prominent varicosities more marked on the left  Neurological: He is alert and oriented to person, place, and time.  Psychiatric: He has a normal mood and affect. His behavior is normal.          Assessment & Plan:   Mild lower extremity edema. No evidence of heart failure. Will observe at the present time. We'll continue salt restriction and observe. We'll attempt to keep the legs elevated when sitting. Will call if there is any worsening. No evidence of the DVT. No calf tenderness Hypertension CAD Osteoarthritis

## 2012-09-07 ENCOUNTER — Ambulatory Visit: Payer: Medicare Other | Admitting: Physical Therapy

## 2012-09-11 ENCOUNTER — Ambulatory Visit: Payer: Medicare Other | Admitting: *Deleted

## 2012-09-14 ENCOUNTER — Ambulatory Visit: Payer: Medicare Other | Admitting: Physical Therapy

## 2012-09-18 ENCOUNTER — Ambulatory Visit: Payer: Medicare Other | Admitting: Physical Therapy

## 2012-09-21 ENCOUNTER — Ambulatory Visit: Payer: Medicare Other | Admitting: Physical Therapy

## 2012-09-25 ENCOUNTER — Ambulatory Visit: Payer: Medicare Other | Admitting: *Deleted

## 2012-09-28 ENCOUNTER — Ambulatory Visit: Payer: Medicare Other | Admitting: Physical Therapy

## 2012-10-02 ENCOUNTER — Ambulatory Visit: Payer: Medicare Other | Attending: Orthopedic Surgery | Admitting: *Deleted

## 2012-10-02 DIAGNOSIS — M25519 Pain in unspecified shoulder: Secondary | ICD-10-CM | POA: Insufficient documentation

## 2012-10-02 DIAGNOSIS — M25619 Stiffness of unspecified shoulder, not elsewhere classified: Secondary | ICD-10-CM | POA: Insufficient documentation

## 2012-10-02 DIAGNOSIS — IMO0001 Reserved for inherently not codable concepts without codable children: Secondary | ICD-10-CM | POA: Insufficient documentation

## 2012-10-02 DIAGNOSIS — R5381 Other malaise: Secondary | ICD-10-CM | POA: Insufficient documentation

## 2012-10-05 ENCOUNTER — Ambulatory Visit: Payer: Medicare Other | Admitting: Physical Therapy

## 2012-10-09 ENCOUNTER — Ambulatory Visit (INDEPENDENT_AMBULATORY_CARE_PROVIDER_SITE_OTHER): Payer: Medicare Other | Admitting: Cardiovascular Disease

## 2012-10-09 ENCOUNTER — Encounter: Payer: Medicare Other | Admitting: *Deleted

## 2012-10-09 ENCOUNTER — Encounter: Payer: Self-pay | Admitting: Cardiovascular Disease

## 2012-10-09 VITALS — BP 137/85 | HR 74 | Ht 71.0 in | Wt 164.0 lb

## 2012-10-09 DIAGNOSIS — I2510 Atherosclerotic heart disease of native coronary artery without angina pectoris: Secondary | ICD-10-CM

## 2012-10-09 NOTE — Progress Notes (Signed)
Dakota Green Date of Birth  [birth year] [facility] [address]    [address] [place], [place]  [address] [phone]  Fax  [phone]   Problem List: 1. CAD, status post coronary artery bypass grafting in 1999,  status post PCI in 2008 2. Right shoulder surgery 3  Hyperlipidemia 4. Arthritis 5. hypertension  History of Present Illness:  Dakota Green is a 78 year old gentleman with a history of coronary artery disease. He has a history of hyperlipidemia. He is intolerant to all statin medications.  He is having lots of problems with arthritis.     Dakota Green fell and injured his right arm about 4 months ago. He's recently had surgery to repair torn tendons in the shoulder. He's doing very well from a cardiac standpoint. He's not had any episodes of chest pain or shortness of breath.   He has not had any cardiac complications since that time.    Current Outpatient Prescriptions on File Prior to Visit  Medication Sig Dispense Refill  . aspirin 81 MG tablet Take 81 mg by mouth daily.        . benazepril (LOTENSIN) 10 MG tablet Take 1 tablet (10 mg total) by mouth daily.  90 tablet  2  . CELEBREX 200 MG capsule Take 200 mg by mouth 2 (two) times daily.       . clopidogrel (PLAVIX) 75 MG tablet Take 1 tablet (75 mg total) by mouth daily.  90 tablet  6  . Desoximetasone 0.05 % GEL Apply topically 2 (two) times daily as needed.        Marland Kitchen esomeprazole (NEXIUM) 40 MG capsule Take 1 capsule (40 mg total) by mouth daily before breakfast.  90 capsule  6  . fish oil-omega-3 fatty acids 1000 MG capsule Take 1,000 mg by mouth 3 (three) times daily.        Marland Kitchen loratadine (CLARITIN) 10 MG tablet Take 10 mg by mouth daily.        . Multiple Vitamins-Minerals (PRESERVISION/LUTEIN PO) Take by mouth daily.        . predniSONE (DELTASONE) 5 MG tablet Take 1 tablet (5 mg total) by mouth daily.  90 tablet  3  . temazepam (RESTORIL) 30 MG capsule Take 1 capsule (30 mg total) by mouth at bedtime as needed.  30  capsule  3  . traMADol (ULTRAM) 50 MG tablet Take 100 mg by mouth every 6 (six) hours as needed.       . diazepam (VALIUM) 5 MG tablet Take 5 mg by mouth every 12 (twelve) hours as needed.       Marland Kitchen HYDROcodone-acetaminophen (VICODIN) 5-500 MG per tablet Take 1 tablet by mouth every 6 (six) hours as needed.       . predniSONE (DELTASONE) 5 MG tablet Take 1.5 tablets (7.5 mg total) by mouth daily.  45 tablet  0  . predniSONE (DELTASONE) 5 MG tablet Take 1 tablet (5 mg total) by mouth daily.  90 tablet  6    Allergies  Allergen Reactions  . Crestor (Rosuvastatin Calcium)     Aches     Past Medical History  Diagnosis Date  . ALLERGIC RHINITIS 05/04/2007  . CORONARY ARTERY DISEASE 05/04/2007  . DIVERTICULOSIS, COLON 08/14/2009  . ESOPHAGEAL STRICTURE 08/14/2009  . GERD 05/04/2007  . HIATAL HERNIA 08/14/2009  . HYPERLIPIDEMIA 05/04/2007  . HYPERTENSION 05/04/2007  . Osteoarth NOS-Unspec 05/04/2007  . PEPTIC ULCER DISEASE 05/04/2007    bleeding ulcer with hospitalization  . DJD (degenerative  joint disease)   . Myocardial infarction 1987  . Torn rotator cuff     right    Past Surgical History  Procedure Date  . Coronary artery bypass graft   . Cataract extraction   . Coronary angioplasty with stent placement   . Cardiac catheterization 06/12/2007    EF 45%  . Cardiac catheterization 01/18/2005    EF 50-55%  . Cardiac catheterization 01/14/2003    EF 55%  . US echocardiography 08/23/2005    EF 50-55%  . Cardiovascular stress test 02/07/2001    History  Smoking status  . Former Smoker  . Quit date: 11/29/1985  Smokeless tobacco  . Never Used    History  Alcohol Use  . Yes    Comment: occ    Family History  Problem Relation Age of Onset  . Heart attack Mother     Reviw of Systems:  Reviewed in the HPI.  All other systems are negative.  Physical Exam: BP 137/85  Pulse 74  Ht 5\' 11"  (1.803 m)  Wt 164 lb (74.39 kg)  BMI 22.87 kg/m2  The patient is alert and oriented x 3.  The  mood and affect are normal.   Skin: warm and dry.  Color is normal.    HEENT:   the sclera are nonicteric.  The mucous membranes are moist.  The carotids are 2+ without bruits.  There is no thyromegaly.  There is no JVD.    Lungs: clear.  The chest wall is non tender.    Heart: regular rate with a normal S1 and S2.  There are no murmurs, gallops, or rubs. The PMI is not displaced.     Abdomen: good bowel sounds.  There is no guarding or rebound.  There is no hepatosplenomegaly or tenderness.  There are no masses.   Extremities:  no clubbing, cyanosis, or edema.  The legs are without rashes.  The distal pulses are intact.   Neuro:  Cranial nerves II - XII are intact.  Motor and sensory functions are intact.    The gait is normal.  ECG: Sinus bradycardia. No St/T abnormalities.  Assessment / Plan:

## 2012-10-09 NOTE — Assessment & Plan Note (Signed)
Dakota Green is doing well. He's not having any episodes of angina. We'll continue with the same medications. I'll see him again in one year.

## 2012-10-09 NOTE — Patient Instructions (Addendum)
Your physician wants you to follow-up in: 1 year with ekg/  You will receive a reminder letter in the mail two months in advance. If you don't receive a letter, please call our office to schedule the follow-up appointment.

## 2012-10-10 ENCOUNTER — Ambulatory Visit: Payer: Medicare Other | Admitting: Physical Therapy

## 2012-10-12 ENCOUNTER — Ambulatory Visit: Payer: Medicare Other | Admitting: Physical Therapy

## 2012-10-17 ENCOUNTER — Other Ambulatory Visit: Payer: Self-pay | Admitting: Internal Medicine

## 2012-10-17 ENCOUNTER — Ambulatory Visit: Payer: Medicare Other | Admitting: Physical Therapy

## 2012-10-17 MED ORDER — TAMSULOSIN HCL 0.4 MG PO CAPS: 0.4000 mg | ORAL_CAPSULE | Freq: Every day | ORAL | Status: DC

## 2012-10-17 NOTE — Telephone Encounter (Addendum)
Pt would like to get a script for something to help slow done his urination. Per prior discussion w/MD. He is going to drive to [place] over the holiday.

## 2012-10-17 NOTE — Telephone Encounter (Signed)
Generic Flomax  0.4 mg #90 one daily

## 2012-10-17 NOTE — Telephone Encounter (Signed)
Please advise 

## 2012-10-17 NOTE — Telephone Encounter (Signed)
Med filled.  

## 2012-10-19 ENCOUNTER — Ambulatory Visit: Payer: Medicare Other | Admitting: Physical Therapy

## 2012-10-24 ENCOUNTER — Ambulatory Visit: Payer: Medicare Other | Admitting: *Deleted

## 2012-10-31 ENCOUNTER — Ambulatory Visit: Payer: Medicare Other | Attending: Orthopedic Surgery | Admitting: Physical Therapy

## 2012-10-31 DIAGNOSIS — M25519 Pain in unspecified shoulder: Secondary | ICD-10-CM | POA: Insufficient documentation

## 2012-10-31 DIAGNOSIS — R5381 Other malaise: Secondary | ICD-10-CM | POA: Insufficient documentation

## 2012-10-31 DIAGNOSIS — M25619 Stiffness of unspecified shoulder, not elsewhere classified: Secondary | ICD-10-CM | POA: Insufficient documentation

## 2012-10-31 DIAGNOSIS — IMO0001 Reserved for inherently not codable concepts without codable children: Secondary | ICD-10-CM | POA: Insufficient documentation

## 2012-11-02 ENCOUNTER — Ambulatory Visit: Payer: Medicare Other | Admitting: Physical Therapy

## 2012-11-03 ENCOUNTER — Other Ambulatory Visit: Payer: Self-pay | Admitting: Internal Medicine

## 2012-11-04 ENCOUNTER — Other Ambulatory Visit: Payer: Self-pay | Admitting: Internal Medicine

## 2012-11-07 ENCOUNTER — Ambulatory Visit: Payer: Medicare Other | Admitting: Physical Therapy

## 2012-11-09 ENCOUNTER — Ambulatory Visit: Payer: Medicare Other | Admitting: Physical Therapy

## 2012-11-14 ENCOUNTER — Ambulatory Visit: Payer: Medicare Other | Admitting: Physical Therapy

## 2012-11-16 ENCOUNTER — Ambulatory Visit: Payer: Medicare Other | Admitting: Physical Therapy

## 2012-11-20 ENCOUNTER — Encounter: Payer: Medicare Other | Admitting: Internal Medicine

## 2012-11-23 ENCOUNTER — Encounter: Payer: Self-pay | Admitting: Cardiovascular Disease

## 2012-11-24 ENCOUNTER — Encounter: Payer: Self-pay | Admitting: Cardiovascular Disease

## 2012-11-28 ENCOUNTER — Ambulatory Visit: Payer: Medicare Other | Admitting: Physical Therapy

## 2012-11-30 ENCOUNTER — Ambulatory Visit: Payer: PRIVATE HEALTH INSURANCE | Attending: Orthopedic Surgery | Admitting: Physical Therapy

## 2012-11-30 DIAGNOSIS — M25619 Stiffness of unspecified shoulder, not elsewhere classified: Secondary | ICD-10-CM | POA: Insufficient documentation

## 2012-11-30 DIAGNOSIS — IMO0001 Reserved for inherently not codable concepts without codable children: Secondary | ICD-10-CM | POA: Insufficient documentation

## 2012-11-30 DIAGNOSIS — M25519 Pain in unspecified shoulder: Secondary | ICD-10-CM | POA: Insufficient documentation

## 2012-11-30 DIAGNOSIS — R5381 Other malaise: Secondary | ICD-10-CM | POA: Insufficient documentation

## 2012-12-04 ENCOUNTER — Ambulatory Visit: Payer: PRIVATE HEALTH INSURANCE | Admitting: *Deleted

## 2012-12-07 ENCOUNTER — Ambulatory Visit: Payer: PRIVATE HEALTH INSURANCE | Admitting: Physical Therapy

## 2012-12-11 ENCOUNTER — Ambulatory Visit: Payer: PRIVATE HEALTH INSURANCE | Admitting: Physical Therapy

## 2012-12-11 ENCOUNTER — Other Ambulatory Visit: Payer: Self-pay | Admitting: Internal Medicine

## 2012-12-14 ENCOUNTER — Ambulatory Visit: Payer: PRIVATE HEALTH INSURANCE | Admitting: Physical Therapy

## 2012-12-15 ENCOUNTER — Encounter: Payer: Self-pay | Admitting: Internal Medicine

## 2012-12-15 ENCOUNTER — Ambulatory Visit (INDEPENDENT_AMBULATORY_CARE_PROVIDER_SITE_OTHER): Payer: PRIVATE HEALTH INSURANCE | Admitting: Internal Medicine

## 2012-12-15 VITALS — BP 120/70 | HR 60 | Temp 98.3°F | Resp 18 | Ht 68.75 in | Wt 165.0 lb

## 2012-12-15 DIAGNOSIS — M75100 Unspecified rotator cuff tear or rupture of unspecified shoulder, not specified as traumatic: Secondary | ICD-10-CM

## 2012-12-15 DIAGNOSIS — E785 Hyperlipidemia, unspecified: Secondary | ICD-10-CM

## 2012-12-15 DIAGNOSIS — I2510 Atherosclerotic heart disease of native coronary artery without angina pectoris: Secondary | ICD-10-CM

## 2012-12-15 DIAGNOSIS — M1990 Unspecified osteoarthritis, unspecified site: Secondary | ICD-10-CM

## 2012-12-15 DIAGNOSIS — S43429A Sprain of unspecified rotator cuff capsule, initial encounter: Secondary | ICD-10-CM

## 2012-12-15 DIAGNOSIS — Z0000 Encounter for general adult medical examination without abnormal findings: Secondary | ICD-10-CM

## 2012-12-15 DIAGNOSIS — I10 Essential (primary) hypertension: Secondary | ICD-10-CM

## 2012-12-15 LAB — TSH: TSH: 1.23 u[IU]/mL (ref 0.35–5.50)

## 2012-12-15 LAB — CBC WITH DIFFERENTIAL/PLATELET
Basophils Absolute: 0 10*3/uL (ref 0.0–0.1)
Basophils Relative: 0.4 % (ref 0.0–3.0)
Eosinophils Absolute: 0.3 10*3/uL (ref 0.0–0.7)
MCHC: 34.5 g/dL (ref 30.0–36.0)
MCV: 96.5 fl (ref 78.0–100.0)
Monocytes Absolute: 0.5 10*3/uL (ref 0.1–1.0)
Neutrophils Relative %: 57.6 % (ref 43.0–77.0)
Platelets: 216 10*3/uL (ref 150.0–400.0)
RDW: 13 % (ref 11.5–14.6)

## 2012-12-15 LAB — COMPREHENSIVE METABOLIC PANEL
ALT: 15 U/L (ref 0–53)
AST: 20 U/L (ref 0–37)
Albumin: 4.1 g/dL (ref 3.5–5.2)
Alkaline Phosphatase: 44 U/L (ref 39–117)
Calcium: 9.7 mg/dL (ref 8.4–10.5)
Chloride: 101 mEq/L (ref 96–112)
Potassium: 5 mEq/L (ref 3.5–5.1)
Sodium: 139 mEq/L (ref 135–145)
Total Protein: 7.2 g/dL (ref 6.0–8.3)

## 2012-12-15 LAB — LDL CHOLESTEROL, DIRECT: Direct LDL: 143.9 mg/dL

## 2012-12-15 LAB — LIPID PANEL
HDL: 62.9 mg/dL (ref >39.00)
VLDL: 11.4 mg/dL (ref 0.0–40.0)

## 2012-12-15 MED ORDER — CLOPIDOGREL BISULFATE 75 MG PO TABS: 75.0000 mg | ORAL_TABLET | Freq: Every day | ORAL | Status: DC

## 2012-12-15 MED ORDER — TEMAZEPAM 30 MG PO CAPS: 30.0000 mg | ORAL_CAPSULE | Freq: Every evening | ORAL | Status: DC | PRN

## 2012-12-15 MED ORDER — PREDNISONE 5 MG PO TABS: 5.0000 mg | ORAL_TABLET | Freq: Every day | ORAL | Status: DC

## 2012-12-15 MED ORDER — TAMSULOSIN HCL 0.4 MG PO CAPS: 0.4000 mg | ORAL_CAPSULE | Freq: Every day | ORAL | Status: DC

## 2012-12-15 MED ORDER — ESOMEPRAZOLE MAGNESIUM 40 MG PO CPDR: 40.0000 mg | DELAYED_RELEASE_CAPSULE | Freq: Every day | ORAL | Status: DC

## 2012-12-15 MED ORDER — CELECOXIB 200 MG PO CAPS: 200.0000 mg | ORAL_CAPSULE | Freq: Every day | ORAL | Status: DC

## 2012-12-15 MED ORDER — DIAZEPAM 5 MG PO TABS: 5.0000 mg | ORAL_TABLET | Freq: Two times a day (BID) | ORAL | Status: DC | PRN

## 2012-12-15 MED ORDER — BENAZEPRIL HCL 10 MG PO TABS: 10.0000 mg | ORAL_TABLET | Freq: Every day | ORAL | Status: DC

## 2012-12-15 NOTE — Patient Instructions (Signed)
It is important that you exercise regularly, at least 20 minutes 3 to 4 times per week.  If you develop chest pain or shortness of breath seek  medical attention.  Limit your sodium (Salt) intake  Return in 4 months for follow-up

## 2012-12-15 NOTE — Progress Notes (Signed)
Patient ID: Dakota Green, male   DOB: 1933/07/11, 79 y.o.   MRN: [id number]   Subjective:    Patient ID: Dakota Green, male    DOB: 04/03/1933, 79 y.o.   MRN: [id number]  Hypertension Pertinent negatives include no chest pain, headaches, neck pain, palpitations or shortness of breath.  Hyperlipidemia Pertinent negatives include no chest pain, myalgias or shortness of breath.  Coronary Artery Disease Pertinent negatives include no chest pain, chest tightness, dizziness, leg swelling, palpitations or shortness of breath. Risk factors include hyperlipidemia and hypertension.    79 -year-old patient who is seen today for followup and any preventive health examination. Is followed by cardiology for coronary artery disease. He has a history of statin intolerance.  This past fall he underwent right shoulder surgery for a torn rotator cuff and he is still participating in physical therapy. He still walks on his treadmill at least 4 times weekly and denies any angina  He has a history of inflammatory osteoarthritis and has done quite well on prednisone 5 mg daily. Attempts at discontinuation have resulted in significant arthritic pain. He also remains on Celebrex. Quite well today; denies any cardiopulmonary complaints   Past Medical History  Diagnosis Date  . ALLERGIC RHINITIS 05/04/2007  . CORONARY ARTERY DISEASE 05/04/2007  . DIVERTICULOSIS, COLON 08/14/2009  . ESOPHAGEAL STRICTURE 08/14/2009  . GERD 05/04/2007  . HIATAL HERNIA 08/14/2009  . HYPERLIPIDEMIA 05/04/2007  . HYPERTENSION 05/04/2007  . Osteoarth NOS-Unspec 05/04/2007  . PEPTIC ULCER DISEASE 05/04/2007    bleeding ulcer with hospitalization  . DJD (degenerative joint disease)   . Myocardial infarction 1987  . Torn rotator cuff     right    History   Social History  . Marital Status: Married    Spouse Name: N/A    Number of Children: N/A  . Years of Education: N/A   Occupational History  . Not on file.   Social History Main Topics  .  Smoking status: Former Smoker    Quit date: 11/29/1985  . Smokeless tobacco: Never Used  . Alcohol Use: Yes     Comment: occ  . Drug Use: No  . Sexually Active: Not on file   Other Topics Concern  . Not on file   Social History Narrative  . No narrative on file    Past Surgical History  Procedure Date  . Coronary artery bypass graft   . Cataract extraction   . Coronary angioplasty with stent placement   . Cardiac catheterization 06/12/2007    EF 45%  . Cardiac catheterization 01/18/2005    EF 50-55%  . Cardiac catheterization 01/14/2003    EF 55%  . US echocardiography 08/23/2005    EF 50-55%  . Cardiovascular stress test 02/07/2001    Family History  Problem Relation Age of Onset  . Heart attack Mother     Allergies  Allergen Reactions  . Crestor (Rosuvastatin Calcium)     Aches     Current Outpatient Prescriptions on File Prior to Visit  Medication Sig Dispense Refill  . aspirin 81 MG tablet Take 81 mg by mouth daily.        . benazepril (LOTENSIN) 10 MG tablet Take 1 tablet (10 mg total) by mouth daily.  90 tablet  2  . CELEBREX 200 MG capsule TAKE 1 CAPSULE BY MOUTH 2 TIMES A DAY  60 capsule  3  . clopidogrel (PLAVIX) 75 MG tablet TAKE 1 TABLET BY MOUTH DAILY  90 tablet  1  . Desoximetasone 0.05 % GEL Apply topically 2 (two) times daily as needed.        . diazepam (VALIUM) 5 MG tablet Take 5 mg by mouth every 12 (twelve) hours as needed.       Marland Kitchen esomeprazole (NEXIUM) 40 MG capsule Take 1 capsule (40 mg total) by mouth daily before breakfast.  90 capsule  6  . fish oil-omega-3 fatty acids 1000 MG capsule Take 1,000 mg by mouth 3 (three) times daily.        Marland Kitchen loratadine (CLARITIN) 10 MG tablet Take 10 mg by mouth daily.        . Multiple Vitamins-Minerals (PRESERVISION/LUTEIN PO) Take by mouth daily.        . predniSONE (DELTASONE) 5 MG tablet Take 1 tablet (5 mg total) by mouth daily.  90 tablet  3  . predniSONE (DELTASONE) 5 MG tablet TAKE 1 TABLET BY MOUTH  DAILY  90 tablet  1  . Tamsulosin HCl (FLOMAX) 0.4 MG CAPS Take 1 capsule (0.4 mg total) by mouth daily.  90 capsule  3  . temazepam (RESTORIL) 30 MG capsule TAKE 1 CAPSULE AT BEDTIME AS NEEDED  30 capsule  1    BP 120/70  Pulse 60  Temp 98.3 F (36.8 C) (Oral)  Resp 18  Ht 5' 8.75" (1.746 m)  Wt 165 lb (74.844 kg)  BMI 24.54 kg/m2  SpO2 95%     Here for Medicare AWV:   1. Risk factors based on Past M, S, F history: patient has known coronary artery disease, history of dyslipidemia, hypertension.  2. Physical Activities: goes to the gym 4 times weekly  3. Depression/mood: no history of depression, or mood disorder  4. Hearing: no deficits  5. ADL's: independent in all aspects of daily living  6. Fall Risk: low  7. Home Safety: no problems identified  8. Height, weight, &visual acuity:height and weight stable. No difficulty with visual acuity  9. Counseling: heart healthy diet, continued regular exercise, all encouraged  10. Labs ordered based on risk factors: laboratory profile, including lipid profile, and sedimentation rate will be reviewed  11. Referral Coordination- rheumatology referral as scheduled  12. Care Plan- will follow up with rheumatology. Will review a sedimentation rate will also discontinue at least temporarily. The Crestor  13. Cognitive Assessment- alert and oriented, with normal affect. No history of memory concerns handles all executive functioning without difficulty   Allergies (verified):  No Known Drug Allergies  Past History:  Past Medical History:  Reviewed history from 04/03/2009 and no changes required.  Coronary artery disease  GERD stricture formation  Hyperlipidemia  Hypertension  Peptic ulcer disease  Allergic rhinitis  DJD  Osteoarthritis   Past Surgical History:  Reviewed history from 08/18/2009 and no changes required.  Coronary artery bypass graft 1999  Cardiac Stent placement-1987, 1998  Cataract extraction-bilaterally  cardiac  catheterization February 2009  EGD 2007  colonoscopy 2004  Right rotator cuff surgery fall 2013  Family History:  Reviewed history from 08/18/2009 and no changes required.  father died age 82 mother died age 55, MI, hypertension  6 brothers 3 sisters, positive for lung and prostate cancer, coronary artery disease  Family History of Prostate Cancer: Brother  No FH of Colon Cancer:  Family History of Heart Disease: Mother   Social History:  Reviewed history from 08/18/2009 and no changes required.  Retired  Married  Patient is a former smoker. -stopped 1987  Alcohol Use - yes-4 drinks  daily  Daily Caffeine Use-1 cup daily  Illicit Drug Use - no   Review of Systems  Constitutional: Negative for fever, chills, activity change, appetite change and fatigue.  HENT: Negative for hearing loss, ear pain, congestion, rhinorrhea, sneezing, mouth sores, trouble swallowing, neck pain, neck stiffness, dental problem, voice change, sinus pressure and tinnitus.   Eyes: Negative for photophobia, pain, redness and visual disturbance.  Respiratory: Negative for apnea, cough, choking, chest tightness, shortness of breath and wheezing.   Cardiovascular: Negative for chest pain, palpitations and leg swelling.  Gastrointestinal: Negative for nausea, vomiting, abdominal pain, diarrhea, constipation, blood in stool, abdominal distention, anal bleeding and rectal pain.  Genitourinary: Negative for dysuria, urgency, frequency, hematuria, flank pain, decreased urine volume, discharge, penile swelling, scrotal swelling, difficulty urinating, genital sores and testicular pain.  Musculoskeletal: Positive for back pain, joint swelling and arthralgias. Negative for myalgias and gait problem.  Skin: Negative for color change, rash and wound.  Neurological: Negative for dizziness, tremors, seizures, syncope, facial asymmetry, speech difficulty, weakness, light-headedness, numbness and headaches.  Hematological:  Negative for adenopathy. Does not bruise/bleed easily.  Psychiatric/Behavioral: Negative for suicidal ideas, hallucinations, behavioral problems, confusion, sleep disturbance, self-injury, dysphoric mood, decreased concentration and agitation. The patient is not nervous/anxious.        Objective:   Physical Exam  Constitutional: He appears well-developed and well-nourished.  HENT:  Head: Normocephalic and atraumatic.  Right Ear: External ear normal.  Left Ear: External ear normal.  Nose: Nose normal.  Mouth/Throat: Oropharynx is clear and moist.  Eyes: Conjunctivae normal and EOM are normal. Pupils are equal, round, and reactive to light. No scleral icterus.  Neck: Normal range of motion. Neck supple. No JVD present. No thyromegaly present.  Cardiovascular: Regular rhythm, normal heart sounds and intact distal pulses.  Exam reveals no gallop and no friction rub.   No murmur heard.      Posterior tibial pulses not easily palpable  Pulmonary/Chest: Effort normal and breath sounds normal. He exhibits no tenderness.  Abdominal: Soft. Bowel sounds are normal. He exhibits no distension and no mass. There is no tenderness.  Genitourinary: Penis normal. Guaiac negative stool.       Prostate +2 enlarged  Musculoskeletal: Normal range of motion. He exhibits no edema and no tenderness.       Right biceps tendon rupture  Lymphadenopathy:    He has no cervical adenopathy.  Neurological: He is alert. He has normal reflexes. No cranial nerve deficit. Coordination normal.  Skin: Skin is warm and dry. No rash noted.  Psychiatric: He has a normal mood and affect. His behavior is normal.          Assessment & Plan:   Preventive health examination Coronary artery disease stable Hypertension well controlled Dyslipidemia Inflammatory osteoarthritis. We'll continue present regimen. Recheck in 4 months Impaired glucose tolerance. Stable

## 2012-12-18 ENCOUNTER — Ambulatory Visit: Payer: PRIVATE HEALTH INSURANCE | Admitting: Physical Therapy

## 2012-12-21 ENCOUNTER — Ambulatory Visit: Payer: PRIVATE HEALTH INSURANCE | Admitting: Physical Therapy

## 2012-12-24 ENCOUNTER — Encounter: Payer: Self-pay | Admitting: Internal Medicine

## 2012-12-26 ENCOUNTER — Encounter: Payer: Self-pay | Admitting: Internal Medicine

## 2012-12-26 ENCOUNTER — Encounter: Payer: PRIVATE HEALTH INSURANCE | Admitting: Physical Therapy

## 2012-12-28 ENCOUNTER — Ambulatory Visit: Payer: PRIVATE HEALTH INSURANCE | Admitting: Physical Therapy

## 2013-01-01 ENCOUNTER — Encounter: Payer: PRIVATE HEALTH INSURANCE | Admitting: *Deleted

## 2013-01-03 ENCOUNTER — Ambulatory Visit: Payer: PRIVATE HEALTH INSURANCE | Attending: Orthopedic Surgery | Admitting: Physical Therapy

## 2013-01-03 DIAGNOSIS — M25519 Pain in unspecified shoulder: Secondary | ICD-10-CM | POA: Insufficient documentation

## 2013-01-03 DIAGNOSIS — R5381 Other malaise: Secondary | ICD-10-CM | POA: Insufficient documentation

## 2013-01-03 DIAGNOSIS — IMO0001 Reserved for inherently not codable concepts without codable children: Secondary | ICD-10-CM | POA: Insufficient documentation

## 2013-01-03 DIAGNOSIS — M25619 Stiffness of unspecified shoulder, not elsewhere classified: Secondary | ICD-10-CM | POA: Insufficient documentation

## 2013-01-04 ENCOUNTER — Ambulatory Visit: Payer: PRIVATE HEALTH INSURANCE | Admitting: Physical Therapy

## 2013-02-05 ENCOUNTER — Other Ambulatory Visit: Payer: Self-pay | Admitting: Internal Medicine

## 2013-02-08 ENCOUNTER — Other Ambulatory Visit: Payer: Self-pay | Admitting: Internal Medicine

## 2013-02-12 ENCOUNTER — Other Ambulatory Visit: Payer: Self-pay | Admitting: Internal Medicine

## 2013-03-08 ENCOUNTER — Other Ambulatory Visit: Payer: Self-pay | Admitting: Internal Medicine

## 2013-04-16 ENCOUNTER — Ambulatory Visit (INDEPENDENT_AMBULATORY_CARE_PROVIDER_SITE_OTHER): Payer: PRIVATE HEALTH INSURANCE | Admitting: Internal Medicine

## 2013-04-16 ENCOUNTER — Encounter: Payer: Self-pay | Admitting: Internal Medicine

## 2013-04-16 VITALS — BP 110/70 | HR 97 | Temp 98.3°F | Resp 20 | Wt 163.0 lb

## 2013-04-16 DIAGNOSIS — E785 Hyperlipidemia, unspecified: Secondary | ICD-10-CM

## 2013-04-16 DIAGNOSIS — I10 Essential (primary) hypertension: Secondary | ICD-10-CM

## 2013-04-16 DIAGNOSIS — K222 Esophageal obstruction: Secondary | ICD-10-CM

## 2013-04-16 DIAGNOSIS — M1990 Unspecified osteoarthritis, unspecified site: Secondary | ICD-10-CM

## 2013-04-16 DIAGNOSIS — I2510 Atherosclerotic heart disease of native coronary artery without angina pectoris: Secondary | ICD-10-CM

## 2013-04-16 NOTE — Patient Instructions (Signed)
Limit your sodium (Salt) intake    It is important that you exercise regularly, at least 20 minutes 3 to 4 times per week.  If you develop chest pain or shortness of breath seek  medical attention.  Return in 6 months for follow-up  

## 2013-04-16 NOTE — Progress Notes (Signed)
Subjective:    Patient ID: Dakota Green, male    DOB: 17-Oct-1933, 79 y.o.   MRN: [id number]  HPI  79 year old patient who has a history of coronary artery disease. This has been stable and he is followed annually by cardiology. Denies any exertional chest pain. He remains on both aspirin and Plavix he does have easy bruisability. He has a history of inflammatory osteoarthritis and has done remarkable well on prednisone 5 mg daily. He has failed a number of attempts at discontinuation of the medication. He also uses Celebrex occasionally. History of hypertension which has been stable. He also has a history of esophageal stricture. He does complain of some mild swallowing difficulty but well managed with careful chewing and eating. He has required 2 or 3 dilatations over the years  Past Medical History  Diagnosis Date  . ALLERGIC RHINITIS 05/04/2007  . CORONARY ARTERY DISEASE 05/04/2007  . DIVERTICULOSIS, COLON 08/14/2009  . ESOPHAGEAL STRICTURE 08/14/2009  . GERD 05/04/2007  . HIATAL HERNIA 08/14/2009  . HYPERLIPIDEMIA 05/04/2007  . HYPERTENSION 05/04/2007  . Osteoarth NOS-Unspec 05/04/2007  . PEPTIC ULCER DISEASE 05/04/2007    bleeding ulcer with hospitalization  . DJD (degenerative joint disease)   . Myocardial infarction 1987  . Torn rotator cuff     right    History   Social History  . Marital Status: Married    Spouse Name: N/A    Number of Children: N/A  . Years of Education: N/A   Occupational History  . Not on file.   Social History Main Topics  . Smoking status: Former Smoker    Quit date: 11/29/1985  . Smokeless tobacco: Never Used  . Alcohol Use: Yes     Comment: occ  . Drug Use: No  . Sexually Active: Not on file   Other Topics Concern  . Not on file   Social History Narrative  . No narrative on file    Past Surgical History  Procedure Laterality Date  . Coronary artery bypass graft    . Cataract extraction    . Coronary angioplasty with stent placement    .  Cardiac catheterization  06/12/2007    EF 45%  . Cardiac catheterization  01/18/2005    EF 50-55%  . Cardiac catheterization  01/14/2003    EF 55%  . US echocardiography  08/23/2005    EF 50-55%  . Cardiovascular stress test  02/07/2001    Family History  Problem Relation Age of Onset  . Heart attack Mother     Allergies  Allergen Reactions  . Crestor (Rosuvastatin Calcium)     Aches     Current Outpatient Prescriptions on File Prior to Visit  Medication Sig Dispense Refill  . aspirin 81 MG tablet Take 81 mg by mouth daily.        . benazepril (LOTENSIN) 10 MG tablet Take 1 tablet (10 mg total) by mouth daily.  90 tablet  2  . CELEBREX 200 MG capsule TAKE 1 CAPSULE BY MOUTH 2 TIMES A DAY  60 capsule  3  . celecoxib (CELEBREX) 200 MG capsule Take 1 capsule (200 mg total) by mouth daily.  60 capsule  3  . clopidogrel (PLAVIX) 75 MG tablet Take 1 tablet (75 mg total) by mouth daily.  90 tablet  5  . Desoximetasone 0.05 % GEL Apply topically 2 (two) times daily as needed.        Marland Kitchen esomeprazole (NEXIUM) 40 MG capsule Take 1 capsule (40 mg  total) by mouth daily before breakfast.  90 capsule  6  . fish oil-omega-3 fatty acids 1000 MG capsule Take 1,000 mg by mouth 3 (three) times daily.        Marland Kitchen loratadine (CLARITIN) 10 MG tablet Take 10 mg by mouth daily.        . Multiple Vitamins-Minerals (PRESERVISION/LUTEIN PO) Take by mouth daily.        . predniSONE (DELTASONE) 5 MG tablet Take 1 tablet (5 mg total) by mouth daily.  90 tablet  3  . Tamsulosin HCl (FLOMAX) 0.4 MG CAPS Take 1 capsule (0.4 mg total) by mouth daily.  90 capsule  3  . temazepam (RESTORIL) 30 MG capsule Take 1 capsule (30 mg total) by mouth at bedtime as needed for sleep.  30 capsule  3  . diazepam (VALIUM) 5 MG tablet Take 1 tablet (5 mg total) by mouth every 12 (twelve) hours as needed.  30 tablet  3   No current facility-administered medications on file prior to visit.    BP 110/70  Pulse 97  Temp(Src) 98.3 F  (36.8 C) (Oral)  Resp 20  Wt 163 lb (73.936 kg)  BMI 24.25 kg/m2  SpO2 96%       Review of Systems  Constitutional: Negative for fever, chills, appetite change and fatigue.  HENT: Negative for hearing loss, ear pain, congestion, sore throat, trouble swallowing, neck stiffness, dental problem, voice change and tinnitus.   Eyes: Negative for pain, discharge and visual disturbance.  Respiratory: Negative for cough, chest tightness, wheezing and stridor.   Cardiovascular: Negative for chest pain, palpitations and leg swelling.  Gastrointestinal: Negative for nausea, vomiting, abdominal pain, diarrhea, constipation, blood in stool and abdominal distention.  Genitourinary: Negative for urgency, hematuria, flank pain, discharge, difficulty urinating and genital sores.  Musculoskeletal: Negative for myalgias, back pain, joint swelling, arthralgias and gait problem.  Skin: Negative for rash.  Neurological: Negative for dizziness, syncope, speech difficulty, weakness, numbness and headaches.  Hematological: Negative for adenopathy. Does not bruise/bleed easily.  Psychiatric/Behavioral: Negative for behavioral problems and dysphoric mood. The patient is not nervous/anxious.        Objective:   Physical Exam  Constitutional: He is oriented to person, place, and time. He appears well-developed.  HENT:  Head: Normocephalic.  Right Ear: External ear normal.  Left Ear: External ear normal.  Eyes: Conjunctivae and EOM are normal.  Neck: Normal range of motion.  Cardiovascular: Normal rate and normal heart sounds.   Dorsalis pedis pulses full. Posterior tibial pulses not easily palpable  Pulmonary/Chest: Breath sounds normal.  Abdominal: Bowel sounds are normal.  Musculoskeletal: Normal range of motion. He exhibits no edema and no tenderness.  Neurological: He is alert and oriented to person, place, and time.  Skin:  A few scattered ecchymoses  Psychiatric: He has a normal mood and affect.  His behavior is normal.          Assessment & Plan:   Inflammatory osteoarthritis. Continue low-dose prednisone Gastroesophageal reflux disease/history of esophageal stricture. Stable at present CAD stable Hypertension well controlled Dyslipidemia  Recheck 6 months

## 2013-05-05 ENCOUNTER — Other Ambulatory Visit: Payer: Self-pay | Admitting: Internal Medicine

## 2013-06-13 ENCOUNTER — Other Ambulatory Visit: Payer: Self-pay | Admitting: Internal Medicine

## 2013-06-15 ENCOUNTER — Other Ambulatory Visit: Payer: Self-pay | Admitting: Internal Medicine

## 2013-07-04 ENCOUNTER — Other Ambulatory Visit: Payer: Self-pay

## 2013-10-01 ENCOUNTER — Encounter: Payer: Self-pay | Admitting: Cardiovascular Disease

## 2013-10-04 ENCOUNTER — Other Ambulatory Visit: Payer: Self-pay

## 2013-10-10 ENCOUNTER — Encounter: Payer: Self-pay | Admitting: Cardiovascular Disease

## 2013-10-10 ENCOUNTER — Other Ambulatory Visit: Payer: Self-pay | Admitting: Internal Medicine

## 2013-10-10 ENCOUNTER — Ambulatory Visit (INDEPENDENT_AMBULATORY_CARE_PROVIDER_SITE_OTHER): Payer: Medicare Other | Admitting: Cardiovascular Disease

## 2013-10-10 VITALS — BP 115/70 | HR 68 | Ht 68.0 in | Wt 158.0 lb

## 2013-10-10 DIAGNOSIS — I10 Essential (primary) hypertension: Secondary | ICD-10-CM

## 2013-10-10 DIAGNOSIS — I2510 Atherosclerotic heart disease of native coronary artery without angina pectoris: Secondary | ICD-10-CM

## 2013-10-10 DIAGNOSIS — E785 Hyperlipidemia, unspecified: Secondary | ICD-10-CM

## 2013-10-10 NOTE — Assessment & Plan Note (Signed)
Dakota Green is doing well.  No CP or dyspnea.  He exercises regularly.    We have tried multiple statins and unfortunately he is not able to take any..  Continue current meds

## 2013-10-10 NOTE — Patient Instructions (Signed)
Your physician wants you to follow-up in: 1 year  You will receive a reminder letter in the mail two months in advance. If you don't receive a letter, please call our office to schedule the follow-up appointment.  Your physician recommends that you continue on your current medications as directed. Please refer to the Current Medication list given to you today.  

## 2013-10-10 NOTE — Progress Notes (Signed)
Dakota Green Date of Birth  15-Oct-1933 [facility] [address]    [address] [place], [place]  [address] [phone]  Fax  [phone]   Problem List: 1. CAD, status post coronary artery bypass grafting in 1999,  status post PCI in 2008 2. Right shoulder surgery 3  Hyperlipidemia 4. Arthritis 5. hypertension  History of Present Illness:  Dakota Green is a 79 year old gentleman with a history of coronary artery disease. He has a history of hyperlipidemia. He is intolerant to all statin medications.  He is having lots of problems with arthritis.     Ramelo fell and injured his right arm about 4 months ago. He's recently had surgery to repair torn tendons in the shoulder. He's doing very well from a cardiac standpoint. He's not had any episodes of chest pain or shortness of breath.   He has not had any cardiac complications since that time.  Nov. 12, 2014:  Dakota Green is doing well.  Staying busy .  Exercising regularly.  Working hard on his [place] in [employer].   [place]    He has been having lots of problems with osteoarthritis.  He is on prednisone and celebrex and is doing well.   He has tired multiple statins and has not found one that he could take.  His last cholesterol levels are elevated.    Current Outpatient Prescriptions on File Prior to Visit  Medication Sig Dispense Refill  . ALPHAGAN P 0.1 % SOLN       . aspirin 81 MG tablet Take 81 mg by mouth daily.        . benazepril (LOTENSIN) 10 MG tablet Take 1 tablet (10 mg total) by mouth daily.  90 tablet  2  . CELEBREX 200 MG capsule TAKE 1 CAPSULE BY MOUTH 2 TIMES A DAY  60 capsule  3  . clopidogrel (PLAVIX) 75 MG tablet Take 1 tablet (75 mg total) by mouth daily.  90 tablet  5  . Desoximetasone 0.05 % GEL Apply topically. Every other day 1 time a day      . diazepam (VALIUM) 5 MG tablet Take 1 tablet (5 mg total) by mouth every 12 (twelve) hours as needed.  30 tablet  3  . esomeprazole (NEXIUM) 40 MG  capsule Take 1 capsule (40 mg total) by mouth daily before breakfast.  90 capsule  6  . fish oil-omega-3 fatty acids 1000 MG capsule Take 1,000 mg by mouth 3 (three) times daily.        Marland Kitchen loratadine (CLARITIN) 10 MG tablet Take 10 mg by mouth daily.        . Multiple Vitamins-Minerals (PRESERVISION/LUTEIN PO) Take by mouth daily.        Marland Kitchen NEXIUM 40 MG capsule TAKE 1 CAPSULE TWICE DAILY  60 capsule  5  . predniSONE (DELTASONE) 5 MG tablet Take 1 tablet (5 mg total) by mouth daily.  90 tablet  3  . Tamsulosin HCl (FLOMAX) 0.4 MG CAPS Take 1 capsule (0.4 mg total) by mouth daily.  90 capsule  3  . temazepam (RESTORIL) 30 MG capsule TAKE 1 CAPSULE BY MOUTH AT BEDTIME AS NEEDED FOR SLEEP  30 capsule  3   No current facility-administered medications on file prior to visit.    Allergies  Allergen Reactions  . Crestor [Rosuvastatin Calcium]     Aches     Past Medical History  Diagnosis Date  . ALLERGIC RHINITIS 05/04/2007  .  CORONARY ARTERY DISEASE 05/04/2007  . DIVERTICULOSIS, COLON 08/14/2009  . ESOPHAGEAL STRICTURE 08/14/2009  . GERD 05/04/2007  . HIATAL HERNIA 08/14/2009  . HYPERLIPIDEMIA 05/04/2007  . HYPERTENSION 05/04/2007  . Osteoarth NOS-Unspec 05/04/2007  . PEPTIC ULCER DISEASE 05/04/2007    bleeding ulcer with hospitalization  . DJD (degenerative joint disease)   . Myocardial infarction 1987  . Torn rotator cuff     right    Past Surgical History  Procedure Laterality Date  . Coronary artery bypass graft    . Cataract extraction    . Coronary angioplasty with stent placement    . Cardiac catheterization  06/12/2007    EF 45%  . Cardiac catheterization  01/18/2005    EF 50-55%  . Cardiac catheterization  01/14/2003    EF 55%  . US echocardiography  08/23/2005    EF 50-55%  . Cardiovascular stress test  02/07/2001    History  Smoking status  . Former Smoker  . Quit date: 11/29/1985  Smokeless tobacco  . Never Used    History  Alcohol Use  . Yes    Comment: occ    Family  History  Problem Relation Age of Onset  . Heart attack Mother     Reviw of Systems:  Reviewed in the HPI.  All other systems are negative.  Physical Exam: BP 115/70  Pulse 68  Ht 5\' 8"  (1.727 m)  Wt 158 lb (71.668 kg)  BMI 24.03 kg/m2  The patient is alert and oriented x 3.  The mood and affect are normal.   Skin: warm and dry.  Color is normal.    HEENT:   the sclera are nonicteric.  The mucous membranes are moist.  The carotids are 2+ without bruits.  There is no thyromegaly.  There is no JVD.    Lungs: clear.  The chest wall is non tender.    Heart: regular rate with a normal S1 and S2.  There are no murmurs, gallops, or rubs. The PMI is not displaced.     Abdomen: good bowel sounds.  There is no guarding or rebound.  There is no hepatosplenomegaly or tenderness.  There are no masses.   Extremities:  no clubbing, cyanosis, or edema.  The legs are without rashes.  The distal pulses are intact.   Neuro:  Cranial nerves II - XII are intact.  Motor and sensory functions are intact.    The gait is normal.  ECG: Sinus bradycardia. No St/T abnormalities.  Assessment / Plan:

## 2013-10-17 ENCOUNTER — Encounter: Payer: Self-pay | Admitting: *Deleted

## 2013-10-18 ENCOUNTER — Ambulatory Visit (INDEPENDENT_AMBULATORY_CARE_PROVIDER_SITE_OTHER): Payer: Medicare Other | Admitting: Internal Medicine

## 2013-10-18 ENCOUNTER — Encounter: Payer: Self-pay | Admitting: Internal Medicine

## 2013-10-18 VITALS — BP 110/66 | HR 76 | Temp 98.0°F | Resp 20 | Wt 155.0 lb

## 2013-10-18 DIAGNOSIS — I10 Essential (primary) hypertension: Secondary | ICD-10-CM

## 2013-10-18 DIAGNOSIS — I2510 Atherosclerotic heart disease of native coronary artery without angina pectoris: Secondary | ICD-10-CM

## 2013-10-18 DIAGNOSIS — M1990 Unspecified osteoarthritis, unspecified site: Secondary | ICD-10-CM

## 2013-10-18 DIAGNOSIS — E785 Hyperlipidemia, unspecified: Secondary | ICD-10-CM

## 2013-10-18 DIAGNOSIS — Z23 Encounter for immunization: Secondary | ICD-10-CM

## 2013-10-18 NOTE — Patient Instructions (Signed)
Limit your sodium (Salt) intake    It is important that you exercise regularly, at least 20 minutes 3 to 4 times per week.  If you develop chest pain or shortness of breath seek  medical attention.  Return in 3 months for follow-up  

## 2013-10-18 NOTE — Progress Notes (Signed)
Subjective:    Patient ID: Dakota Green, male    DOB: [birth year], 79 y.o.   MRN: [id number]  HPI Pre-visit discussion using our clinic review tool. No additional management support is needed unless otherwise documented below in the visit note.  79 year old patient who is in today for his coronary followup. He has had a recent cardiology evaluation. He does have stable CAD. He has hypertension dyslipidemia and a history of osteoarthritis. In spite of his arthritic pain he remains quite active physically.  Denies any cardiopulmonary complaints he  Past Medical History  Diagnosis Date  . ALLERGIC RHINITIS 05/04/2007  . CORONARY ARTERY DISEASE 05/04/2007  . DIVERTICULOSIS, COLON 08/14/2009  . ESOPHAGEAL STRICTURE 08/14/2009  . GERD 05/04/2007  . HIATAL HERNIA 08/14/2009  . HYPERLIPIDEMIA 05/04/2007  . HYPERTENSION 05/04/2007  . Osteoarth NOS-Unspec 05/04/2007  . PEPTIC ULCER DISEASE 05/04/2007    bleeding ulcer with hospitalization  . DJD (degenerative joint disease)   . Myocardial infarction 1987  . Torn rotator cuff     right    History   Social History  . Marital Status: Married    Spouse Name: N/A    Number of Children: N/A  . Years of Education: N/A   Occupational History  . Not on file.   Social History Main Topics  . Smoking status: Former Smoker    Quit date: 11/29/1985  . Smokeless tobacco: Never Used  . Alcohol Use: Yes     Comment: occ  . Drug Use: No  . Sexual Activity: Not on file   Other Topics Concern  . Not on file   Social History Narrative  . No narrative on file    Past Surgical History  Procedure Laterality Date  . Coronary artery bypass graft    . Cataract extraction    . Coronary angioplasty with stent placement    . Cardiac catheterization  06/12/2007    EF 45%  . Cardiac catheterization  01/18/2005    EF 50-55%  . Cardiac catheterization  01/14/2003    EF 55%  . US echocardiography  08/23/2005    EF 50-55%  . Cardiovascular stress test  02/07/2001     Family History  Problem Relation Age of Onset  . Heart attack Mother     Allergies  Allergen Reactions  . Crestor [Rosuvastatin Calcium]     Aches     Current Outpatient Prescriptions on File Prior to Visit  Medication Sig Dispense Refill  . ALPHAGAN P 0.1 % SOLN Place 1 drop into the right eye 2 (two) times daily.       Marland Kitchen aspirin 81 MG tablet Take 81 mg by mouth daily.        . benazepril (LOTENSIN) 10 MG tablet Take 1 tablet (10 mg total) by mouth daily.  90 tablet  2  . CELEBREX 200 MG capsule TAKE 1 CAPSULE BY MOUTH 2 TIMES A DAY  60 capsule  3  . clopidogrel (PLAVIX) 75 MG tablet Take 1 tablet (75 mg total) by mouth daily.  90 tablet  5  . Desoximetasone 0.05 % GEL Apply topically. Every other day 1 time a day      . diazepam (VALIUM) 5 MG tablet Take 1 tablet (5 mg total) by mouth every 12 (twelve) hours as needed.  30 tablet  3  . esomeprazole (NEXIUM) 40 MG capsule Take 1 capsule (40 mg total) by mouth daily before breakfast.  90 capsule  6  . fish oil-omega-3 fatty acids  1000 MG capsule Take 1,000 mg by mouth 3 (three) times daily.        . Multiple Vitamins-Minerals (PRESERVISION/LUTEIN PO) Take by mouth daily.        . predniSONE (DELTASONE) 5 MG tablet Take 1 tablet (5 mg total) by mouth daily.  90 tablet  3  . Tamsulosin HCl (FLOMAX) 0.4 MG CAPS Take 1 capsule (0.4 mg total) by mouth daily.  90 capsule  3  . temazepam (RESTORIL) 30 MG capsule TAKE 1 CAPSULE AT BEDTIME  30 capsule  3  . loratadine (CLARITIN) 10 MG tablet Take 10 mg by mouth daily.         No current facility-administered medications on file prior to visit.    BP 110/66  Pulse 76  Temp(Src) 98 F (36.7 C) (Oral)  Resp 20  Wt 155 lb (70.308 kg)  SpO2 95%       Review of Systems  Constitutional: Negative for fever, chills, appetite change and fatigue.  HENT: Negative for congestion, dental problem, ear pain, hearing loss, sore throat, tinnitus, trouble swallowing and voice change.   Eyes:  Negative for pain, discharge and visual disturbance.  Respiratory: Negative for cough, chest tightness, wheezing and stridor.   Cardiovascular: Negative for chest pain, palpitations and leg swelling.  Gastrointestinal: Negative for nausea, vomiting, abdominal pain, diarrhea, constipation, blood in stool and abdominal distention.  Genitourinary: Negative for urgency, hematuria, flank pain, discharge, difficulty urinating and genital sores.  Musculoskeletal: Positive for arthralgias, back pain and neck stiffness. Negative for gait problem, joint swelling and myalgias.  Skin: Negative for rash.  Neurological: Negative for dizziness, syncope, speech difficulty, weakness, numbness and headaches.  Hematological: Negative for adenopathy. Does not bruise/bleed easily.  Psychiatric/Behavioral: Negative for behavioral problems and dysphoric mood. The patient is not nervous/anxious.        Objective:   Physical Exam  Constitutional: He is oriented to person, place, and time. He appears well-developed.  Blood pressure low normal  HENT:  Head: Normocephalic.  Right Ear: External ear normal.  Left Ear: External ear normal.  Eyes: Conjunctivae and EOM are normal.  Neck: Normal range of motion.  Cardiovascular: Normal rate and normal heart sounds.   Pulmonary/Chest: Breath sounds normal.  Abdominal: Bowel sounds are normal.  Musculoskeletal: Normal range of motion. He exhibits no edema and no tenderness.  Neurological: He is alert and oriented to person, place, and time.  Psychiatric: He has a normal mood and affect. His behavior is normal.          Assessment & Plan:   CAD stable Hypertension well controlled Dyslipidemia/statin intolerance Osteoarthritis  CPX 3 months

## 2013-10-18 NOTE — Progress Notes (Signed)
Pre-visit discussion using our clinic review tool. No additional management support is needed unless otherwise documented below in the visit note.  

## 2013-11-03 ENCOUNTER — Other Ambulatory Visit: Payer: Self-pay | Admitting: Internal Medicine

## 2013-12-06 ENCOUNTER — Encounter: Payer: PRIVATE HEALTH INSURANCE | Admitting: Internal Medicine

## 2013-12-12 ENCOUNTER — Other Ambulatory Visit: Payer: Self-pay | Admitting: Internal Medicine

## 2013-12-27 ENCOUNTER — Other Ambulatory Visit: Payer: Self-pay | Admitting: Internal Medicine

## 2014-01-14 ENCOUNTER — Other Ambulatory Visit (INDEPENDENT_AMBULATORY_CARE_PROVIDER_SITE_OTHER): Payer: Medicare Other

## 2014-01-14 DIAGNOSIS — Z0000 Encounter for general adult medical examination without abnormal findings: Secondary | ICD-10-CM

## 2014-01-14 DIAGNOSIS — I10 Essential (primary) hypertension: Secondary | ICD-10-CM

## 2014-01-14 DIAGNOSIS — E785 Hyperlipidemia, unspecified: Secondary | ICD-10-CM

## 2014-01-14 LAB — BASIC METABOLIC PANEL
BUN: 14 mg/dL (ref 6–23)
CO2: 30 meq/L (ref 19–32)
Calcium: 9.9 mg/dL (ref 8.4–10.5)
Chloride: 104 mEq/L (ref 96–112)
Creatinine, Ser: 0.8 mg/dL (ref 0.4–1.5)
GFR: 99.08 mL/min (ref >60.00)
Glucose, Bld: 96 mg/dL (ref 70–99)
POTASSIUM: 4.8 meq/L (ref 3.5–5.1)
SODIUM: 142 meq/L (ref 135–145)

## 2014-01-14 LAB — POCT URINALYSIS DIPSTICK
Bilirubin, UA: NEGATIVE
Glucose, UA: NEGATIVE
Ketones, UA: NEGATIVE
Leukocytes, UA: NEGATIVE
Nitrite, UA: NEGATIVE
PH UA: 6
PROTEIN UA: NEGATIVE
RBC UA: NEGATIVE
SPEC GRAV UA: 1.02
UROBILINOGEN UA: 0.2

## 2014-01-14 LAB — LIPID PANEL
CHOL/HDL RATIO: 3
Cholesterol: 218 mg/dL — ABNORMAL HIGH (ref 0–200)
HDL: 71.8 mg/dL (ref >39.00)
Triglycerides: 53 mg/dL (ref 0.0–149.0)
VLDL: 10.6 mg/dL (ref 0.0–40.0)

## 2014-01-14 LAB — HEPATIC FUNCTION PANEL
ALT: 13 U/L (ref 0–53)
AST: 20 U/L (ref 0–37)
Albumin: 4 g/dL (ref 3.5–5.2)
Alkaline Phosphatase: 42 U/L (ref 39–117)
Bilirubin, Direct: 0.1 mg/dL (ref 0.0–0.3)
Total Bilirubin: 1 mg/dL (ref 0.3–1.2)
Total Protein: 6.5 g/dL (ref 6.0–8.3)

## 2014-01-14 LAB — CBC WITH DIFFERENTIAL/PLATELET
BASOS ABS: 0 10*3/uL (ref 0.0–0.1)
Basophils Relative: 0.4 % (ref 0.0–3.0)
Eosinophils Absolute: 0.3 10*3/uL (ref 0.0–0.7)
Eosinophils Relative: 6 % — ABNORMAL HIGH (ref 0.0–5.0)
HCT: 40 % (ref 39.0–52.0)
Hemoglobin: 13.1 g/dL (ref 13.0–17.0)
Lymphocytes Relative: 23.9 % (ref 12.0–46.0)
Lymphs Abs: 1.1 10*3/uL (ref 0.7–4.0)
MCHC: 32.7 g/dL (ref 30.0–36.0)
MCV: 99.2 fl (ref 78.0–100.0)
MONOS PCT: 8 % (ref 3.0–12.0)
Monocytes Absolute: 0.4 10*3/uL (ref 0.1–1.0)
NEUTROS ABS: 2.8 10*3/uL (ref 1.4–7.7)
Neutrophils Relative %: 61.7 % (ref 43.0–77.0)
Platelets: 215 10*3/uL (ref 150.0–400.0)
RBC: 4.04 Mil/uL — ABNORMAL LOW (ref 4.22–5.81)
RDW: 14.3 % (ref 11.5–14.6)
WBC: 4.5 10*3/uL (ref 4.5–10.5)

## 2014-01-14 LAB — TSH: TSH: 1.25 u[IU]/mL (ref 0.35–5.50)

## 2014-01-14 LAB — LDL CHOLESTEROL, DIRECT: LDL DIRECT: 132.6 mg/dL

## 2014-01-14 LAB — PSA: PSA: 1.24 ng/mL (ref 0.10–4.00)

## 2014-01-21 ENCOUNTER — Ambulatory Visit (INDEPENDENT_AMBULATORY_CARE_PROVIDER_SITE_OTHER): Payer: Medicare Other | Admitting: Internal Medicine

## 2014-01-21 ENCOUNTER — Encounter: Payer: Self-pay | Admitting: Internal Medicine

## 2014-01-21 VITALS — BP 102/60 | HR 70 | Temp 98.5°F | Resp 20 | Ht 68.0 in | Wt 158.0 lb

## 2014-01-21 DIAGNOSIS — I2510 Atherosclerotic heart disease of native coronary artery without angina pectoris: Secondary | ICD-10-CM

## 2014-01-21 DIAGNOSIS — Z0000 Encounter for general adult medical examination without abnormal findings: Secondary | ICD-10-CM

## 2014-01-21 DIAGNOSIS — I10 Essential (primary) hypertension: Secondary | ICD-10-CM

## 2014-01-21 DIAGNOSIS — E785 Hyperlipidemia, unspecified: Secondary | ICD-10-CM

## 2014-01-21 MED ORDER — TEMAZEPAM 30 MG PO CAPS: ORAL_CAPSULE | ORAL | Status: DC

## 2014-01-21 NOTE — Patient Instructions (Signed)
Limit your sodium (Salt) intake    It is important that you exercise regularly, at least 20 minutes 3 to 4 times per week.  If you develop chest pain or shortness of breath seek  medical attention. 

## 2014-01-21 NOTE — Progress Notes (Signed)
Subjective:    Patient ID: Dakota Green, male    DOB: [birth year], 80 y.o.   MRN: [id number]  HPI  Subjective:    Patient ID: Dakota Green, male    DOB: 03-Apr-1933, 80 y.o.   MRN: [id number]  HPI  34 -year-old patient who is seen today for followup and any preventive health examination. Is followed by cardiology for coronary artery disease.  He continues to do quite well.  He denies any cardiopulmonary complaints.  He has dyslipidemia, but has been statin intolerant. He has a history of inflammatory osteoarthritis and has done quite well on prednisone 5 mg daily. Attempts at discontinuation have resulted in significant arthritic pain. He also remains on Celebrex. Quite well today; denies any cardiopulmonary complaints   Here for Medicare AWV:   1. Risk factors based on Past M, S, F history: patient has known coronary artery disease, history of dyslipidemia, hypertension.  2. Physical Activities: goes to the gym 4 times weekly  3. Depression/mood: no history of depression, or mood disorder  4. Hearing: no deficits  5. ADL's: independent in all aspects of daily living  6. Fall Risk: low  7. Home Safety: no problems identified  8. Height, weight, &visual acuity:height and weight stable. No difficulty with visual acuity  9. Counseling: heart healthy diet, continued regular exercise, all encouraged  10. Labs ordered based on risk factors: laboratory profile, including lipid profile, and sedimentation rate will be reviewed  11. Referral Coordination- rheumatology referral as scheduled  12. Care Plan- will follow up with rheumatology. Will review a sedimentation rate will also discontinue at least temporarily. The Crestor  13. Cognitive Assessment- alert and oriented, with normal affect. No history of memory concerns handles all executive functioning without difficulty   Allergies (verified):  No Known Drug Allergies  Past History:  Past Medical History:  Reviewed history from 04/03/2009  and no changes required.  Coronary artery disease  GERD stricture formation  Hyperlipidemia  Hypertension  Peptic ulcer disease  Allergic rhinitis  DJD  Osteoarthritis   Past Surgical History:  Reviewed history from 08/18/2009 and no changes required.  Coronary artery bypass graft 1999  Cardiac Stent placement-1987, 1998  Cataract extraction-bilaterally  cardiac catheterization February 2009  EGD 2007  colonoscopy 2004   Family History:  Reviewed history from 08/18/2009 and no changes required.  father died age 27 mother died age 50, MI, hypertension  6 brothers 3 sisters, positive for lung and prostate cancer, coronary artery disease  Family History of Prostate Cancer: Brother  No FH of Colon Cancer:  Family History of Heart Disease: Mother   Social History:  Reviewed history from 08/18/2009 and no changes required.  Retired  Married  Patient is a former smoker. -stopped 1987  Alcohol Use - yes-4 drinks daily  Daily Caffeine Use-1 cup daily  Illicit Drug Use - no   Review of Systems  Constitutional: Negative for fever, chills, activity change, appetite change and fatigue.  HENT: Negative for hearing loss, ear pain, congestion, rhinorrhea, sneezing, mouth sores, trouble swallowing, neck pain, neck stiffness, dental problem, voice change, sinus pressure and tinnitus.   Eyes: Negative for photophobia, pain, redness and visual disturbance.  Respiratory: Negative for apnea, cough, choking, chest tightness, shortness of breath and wheezing.   Cardiovascular: Negative for chest pain, palpitations and leg swelling.  Gastrointestinal: Negative for nausea, vomiting, abdominal pain, diarrhea, constipation, blood in stool, abdominal distention, anal bleeding and rectal pain.  Genitourinary: Negative for dysuria, urgency, frequency, hematuria,  flank pain, decreased urine volume, discharge, penile swelling, scrotal swelling, difficulty urinating, genital sores and testicular pain.    Musculoskeletal: Positive for back pain, joint swelling and arthralgias. Negative for myalgias and gait problem.  Skin: Negative for color change, rash and wound.  Neurological: Negative for dizziness, tremors, seizures, syncope, facial asymmetry, speech difficulty, weakness, light-headedness, numbness and headaches.  Hematological: Negative for adenopathy. Does not bruise/bleed easily.  Psychiatric/Behavioral: Negative for suicidal ideas, hallucinations, behavioral problems, confusion, sleep disturbance, self-injury, dysphoric mood, decreased concentration and agitation. The patient is not nervous/anxious.        Objective:   Physical Exam  Constitutional: He appears well-developed and well-nourished.  HENT:  Head: Normocephalic and atraumatic.  Right Ear: External ear normal.  Left Ear: External ear normal.  Nose: Nose normal.  Mouth/Throat: Oropharynx is clear and moist.  Eyes: Conjunctivae and EOM are normal. Pupils are equal, round, and reactive to light. No scleral icterus.  Neck: Normal range of motion. Neck supple. No JVD present. No thyromegaly present.  Cardiovascular: Regular rhythm, normal heart sounds and intact distal pulses.  Exam reveals no gallop and no friction rub.   No murmur heard.      Posterior tibial pulses not easily palpable  Pulmonary/Chest: Effort normal and breath sounds normal. He exhibits no tenderness.  Abdominal: Soft. Bowel sounds are normal. He exhibits no distension and no mass. There is no tenderness.  Genitourinary: Penis normal. Guaiac negative stool.       Prostate +2 enlarged  Musculoskeletal: Normal range of motion. He exhibits no edema and no tenderness.  Lymphadenopathy:    He has no cervical adenopathy.  Neurological: He is alert. He has normal reflexes. No cranial nerve deficit. Coordination normal.  Skin: Skin is warm and dry. No rash noted.  Psychiatric: He has a normal mood and affect. His behavior is normal.          Assessment  & Plan:   Preventive health examination Coronary artery disease stable Hypertension well controlled Dyslipidemia Inflammatory osteoarthritis. We'll continue present regimen. Recheck in 4 months Impaired glucose tolerance. Stable   Review of Systems     Objective:   Physical Exam  Neck:  Faint right supraclavicular bruit          Assessment & Plan:   As above Laboratory studies reviewed.  Fasting blood sugar today.  Normal No change in therapy  Recheck 6 months

## 2014-01-21 NOTE — Progress Notes (Signed)
Pre-visit discussion using our clinic review tool. No additional management support is needed unless otherwise documented below in the visit note.  

## 2014-01-22 ENCOUNTER — Telehealth: Payer: Self-pay | Admitting: Internal Medicine

## 2014-01-22 NOTE — Telephone Encounter (Signed)
Relevant patient education assigned to patient using Emmi. ° °

## 2014-02-01 ENCOUNTER — Other Ambulatory Visit: Payer: Self-pay | Admitting: Internal Medicine

## 2014-03-07 ENCOUNTER — Other Ambulatory Visit: Payer: Self-pay | Admitting: Internal Medicine

## 2014-04-01 ENCOUNTER — Encounter: Payer: Self-pay | Admitting: Internal Medicine

## 2014-04-01 ENCOUNTER — Ambulatory Visit (INDEPENDENT_AMBULATORY_CARE_PROVIDER_SITE_OTHER): Payer: Medicare Other | Admitting: Internal Medicine

## 2014-04-01 VITALS — BP 118/70 | HR 87 | Temp 98.2°F | Resp 20 | Ht 68.0 in | Wt 156.0 lb

## 2014-04-01 DIAGNOSIS — T148 Other injury of unspecified body region: Secondary | ICD-10-CM

## 2014-04-01 DIAGNOSIS — I10 Essential (primary) hypertension: Secondary | ICD-10-CM

## 2014-04-01 DIAGNOSIS — W57XXXA Bitten or stung by nonvenomous insect and other nonvenomous arthropods, initial encounter: Secondary | ICD-10-CM

## 2014-04-01 NOTE — Progress Notes (Signed)
Pre-visit discussion using our clinic review tool. No additional management support is needed unless otherwise documented below in the visit note.  

## 2014-04-01 NOTE — Patient Instructions (Signed)
Call or return to clinic prn if these symptoms worsen or fail to improve as anticipated.

## 2014-04-01 NOTE — Progress Notes (Signed)
Subjective:    Patient ID: Dakota Green, male    DOB: [birth year], 80 y.o.   MRN: [id number]  HPI    80 year old patient shows a tick bite to the left upper inner thigh region.  2 months ago.  This was removed.  Since that time, he has had a persistent lesion that has been pruritic. No fever or other constitutional complaints.  No skin rash  Past Medical History  Diagnosis Date  . ALLERGIC RHINITIS 05/04/2007  . CORONARY ARTERY DISEASE 05/04/2007  . DIVERTICULOSIS, COLON 08/14/2009  . ESOPHAGEAL STRICTURE 08/14/2009  . GERD 05/04/2007  . HIATAL HERNIA 08/14/2009  . HYPERLIPIDEMIA 05/04/2007  . HYPERTENSION 05/04/2007  . Osteoarth NOS-Unspec 05/04/2007  . PEPTIC ULCER DISEASE 05/04/2007    bleeding ulcer with hospitalization  . DJD (degenerative joint disease)   . Myocardial infarction 1987  . Torn rotator cuff     right    History   Social History  . Marital Status: Married    Spouse Name: N/A    Number of Children: N/A  . Years of Education: N/A   Occupational History  . Not on file.   Social History Main Topics  . Smoking status: Former Smoker    Quit date: 11/29/1985  . Smokeless tobacco: Never Used  . Alcohol Use: Yes     Comment: occ  . Drug Use: No  . Sexual Activity: Not on file   Other Topics Concern  . Not on file   Social History Narrative  . No narrative on file    Past Surgical History  Procedure Laterality Date  . Coronary artery bypass graft    . Cataract extraction    . Coronary angioplasty with stent placement    . Cardiac catheterization  06/12/2007    EF 45%  . Cardiac catheterization  01/18/2005    EF 50-55%  . Cardiac catheterization  01/14/2003    EF 55%  . US echocardiography  08/23/2005    EF 50-55%  . Cardiovascular stress test  02/07/2001    Family History  Problem Relation Age of Onset  . Heart attack Mother     Allergies  Allergen Reactions  . Crestor [Rosuvastatin Calcium]     Aches     Current Outpatient Prescriptions on File  Prior to Visit  Medication Sig Dispense Refill  . ALPHAGAN P 0.1 % SOLN Place 1 drop into the right eye 2 (two) times daily.       Marland Kitchen aspirin 81 MG tablet Take 81 mg by mouth daily.        . benazepril (LOTENSIN) 10 MG tablet TAKE 1 TABLET DAILY  90 tablet  1  . celecoxib (CELEBREX) 200 MG capsule TAKE 1 CAPSULE BY MOUTH 2 TIMES A DAY  60 capsule  3  . clopidogrel (PLAVIX) 75 MG tablet Take 1 tablet (75 mg total) by mouth daily.  90 tablet  5  . esomeprazole (NEXIUM) 40 MG capsule Take 1 capsule (40 mg total) by mouth daily before breakfast.  90 capsule  6  . fish oil-omega-3 fatty acids 1000 MG capsule Take 1,000 mg by mouth 3 (three) times daily.        Marland Kitchen loratadine (CLARITIN) 10 MG tablet Take 10 mg by mouth daily.        . Multiple Vitamins-Minerals (PRESERVISION/LUTEIN PO) Take by mouth daily.        . predniSONE (DELTASONE) 5 MG tablet Take 1 tablet (5 mg total) by mouth daily.  90 tablet  3  . tamsulosin (FLOMAX) 0.4 MG CAPS capsule TAKE 1 CAPSULE (0.4 MG TOTAL) BY MOUTH DAILY.  90 capsule  3  . temazepam (RESTORIL) 30 MG capsule TAKE 1 CAPSULE AT BEDTIME  30 capsule  5   No current facility-administered medications on file prior to visit.    BP 118/70  Pulse 87  Temp(Src) 98.2 F (36.8 C) (Oral)  Resp 20  Ht 5\' 8"  (1.727 m)  Wt 156 lb (70.761 kg)  BMI 23.73 kg/m2  SpO2 97%       Review of Systems  Constitutional: Negative for fever, chills, appetite change and fatigue.  HENT: Negative for congestion, dental problem, ear pain, hearing loss, sore throat, tinnitus, trouble swallowing and voice change.   Eyes: Negative for pain, discharge and visual disturbance.  Respiratory: Negative for cough, chest tightness, wheezing and stridor.   Cardiovascular: Negative for chest pain, palpitations and leg swelling.  Gastrointestinal: Negative for nausea, vomiting, abdominal pain, diarrhea, constipation, blood in stool and abdominal distention.  Genitourinary: Negative for urgency,  hematuria, flank pain, discharge, difficulty urinating and genital sores.  Musculoskeletal: Positive for arthralgias and back pain. Negative for gait problem, joint swelling, myalgias and neck stiffness.       Worsening left shoulder pain  Skin: Positive for wound. Negative for rash.  Neurological: Negative for dizziness, syncope, speech difficulty, weakness, numbness and headaches.  Hematological: Negative for adenopathy. Does not bruise/bleed easily.  Psychiatric/Behavioral: Negative for behavioral problems and dysphoric mood. The patient is not nervous/anxious.        Objective:   Physical Exam  Constitutional: He appears well-developed and well-nourished.  Skin:  Small 4-5 mm crusted lesion left upper inner thigh.  Surrounded by area of Induration and mild erythema No foreign body identified          Assessment & Plan:   Foreign body reaction left medial thigh.  We'll clinically observe at the present time.  No constitutional complaints.  We'll call if there is any clinical change Tick bite

## 2014-05-03 ENCOUNTER — Ambulatory Visit (INDEPENDENT_AMBULATORY_CARE_PROVIDER_SITE_OTHER): Payer: Medicare Other | Admitting: Internal Medicine

## 2014-05-03 ENCOUNTER — Telehealth: Payer: Self-pay | Admitting: Internal Medicine

## 2014-05-03 ENCOUNTER — Encounter: Payer: Self-pay | Admitting: Internal Medicine

## 2014-05-03 VITALS — BP 118/60 | HR 71 | Temp 97.5°F | Resp 20 | Ht 68.0 in | Wt 154.0 lb

## 2014-05-03 DIAGNOSIS — J309 Allergic rhinitis, unspecified: Secondary | ICD-10-CM

## 2014-05-03 DIAGNOSIS — I10 Essential (primary) hypertension: Secondary | ICD-10-CM

## 2014-05-03 DIAGNOSIS — S43429A Sprain of unspecified rotator cuff capsule, initial encounter: Secondary | ICD-10-CM

## 2014-05-03 DIAGNOSIS — M75100 Unspecified rotator cuff tear or rupture of unspecified shoulder, not specified as traumatic: Secondary | ICD-10-CM

## 2014-05-03 MED ORDER — HYDROCODONE-HOMATROPINE 5-1.5 MG/5ML PO SYRP: 5.0000 mL | ORAL_SOLUTION | Freq: Four times a day (QID) | ORAL | Status: DC | PRN

## 2014-05-03 NOTE — Telephone Encounter (Signed)
Noted  

## 2014-05-03 NOTE — Patient Instructions (Signed)
Acute bronchitis symptoms  are generally not helped by antibiotics.  Take over-the-counter expectorants and cough medications such as  Mucinex DM.  Call if there is no improvement in 5 to 7 days or if  you develop worsening cough, fever, or new symptoms, such as shortness of breath or chest pain. 

## 2014-05-03 NOTE — Progress Notes (Signed)
Subjective:    Patient ID: Dakota Green, male    DOB: 12/31/1932, 80 y.o.   MRN: [id number]  HPI  80 year old patient who has advanced osteoarthritis, and also a history of allergic rhinitis.  He is scheduled for elective left shoulder surgery for a torn rotator cuff on June 16.  For the past 2 weeks.  He has had sinus congestion, rhinorrhea, and cough.  There's been no fever or purulent sputum production.  No wheezing or shortness of breath.  Past Medical History  Diagnosis Date  . ALLERGIC RHINITIS 05/04/2007  . CORONARY ARTERY DISEASE 05/04/2007  . DIVERTICULOSIS, COLON 08/14/2009  . ESOPHAGEAL STRICTURE 08/14/2009  . GERD 05/04/2007  . HIATAL HERNIA 08/14/2009  . HYPERLIPIDEMIA 05/04/2007  . HYPERTENSION 05/04/2007  . Osteoarth NOS-Unspec 05/04/2007  . PEPTIC ULCER DISEASE 05/04/2007    bleeding ulcer with hospitalization  . DJD (degenerative joint disease)   . Myocardial infarction 1987  . Torn rotator cuff     right    History   Social History  . Marital Status: Married    Spouse Name: N/A    Number of Children: N/A  . Years of Education: N/A   Occupational History  . Not on file.   Social History Main Topics  . Smoking status: Former Smoker    Quit date: 11/29/1985  . Smokeless tobacco: Never Used  . Alcohol Use: Yes     Comment: occ  . Drug Use: No  . Sexual Activity: Not on file   Other Topics Concern  . Not on file   Social History Narrative  . No narrative on file    Past Surgical History  Procedure Laterality Date  . Coronary artery bypass graft    . Cataract extraction    . Coronary angioplasty with stent placement    . Cardiac catheterization  06/12/2007    EF 45%  . Cardiac catheterization  01/18/2005    EF 50-55%  . Cardiac catheterization  01/14/2003    EF 55%  . [place] echocardiography  08/23/2005    EF 50-55%  . Cardiovascular stress test  02/07/2001    Family History  Problem Relation Age of Onset  . Heart attack Mother     Allergies  Allergen  Reactions  . Crestor [Rosuvastatin Calcium]     Aches     Current Outpatient Prescriptions on File Prior to Visit  Medication Sig Dispense Refill  . ALPHAGAN P 0.1 % SOLN Place 1 drop into the right eye 2 (two) times daily.       Marland Kitchen. aspirin 81 MG tablet Take 81 mg by mouth daily.        . benazepril (LOTENSIN) 10 MG tablet TAKE 1 TABLET DAILY  90 tablet  1  . celecoxib (CELEBREX) 200 MG capsule TAKE 1 CAPSULE BY MOUTH 2 TIMES A DAY  60 capsule  3  . clopidogrel (PLAVIX) 75 MG tablet Take 1 tablet (75 mg total) by mouth daily.  90 tablet  5  . esomeprazole (NEXIUM) 40 MG capsule Take 1 capsule (40 mg total) by mouth daily before breakfast.  90 capsule  6  . fish oil-omega-3 fatty acids 1000 MG capsule Take 1,000 mg by mouth 3 (three) times daily.        Marland Kitchen. loratadine (CLARITIN) 10 MG tablet Take 10 mg by mouth daily.        . Multiple Vitamins-Minerals (PRESERVISION/LUTEIN PO) Take by mouth daily.        . predniSONE (  DELTASONE) 5 MG tablet Take 1 tablet (5 mg total) by mouth daily.  90 tablet  3  . tamsulosin (FLOMAX) 0.4 MG CAPS capsule TAKE 1 CAPSULE (0.4 MG TOTAL) BY MOUTH DAILY.  90 capsule  3  . temazepam (RESTORIL) 30 MG capsule TAKE 1 CAPSULE AT BEDTIME  30 capsule  5   No current facility-administered medications on file prior to visit.    BP 118/60  Pulse 71  Temp(Src) 97.5 F (36.4 C) (Oral)  Resp 20  Ht 5\' 8"  (1.727 m)  Wt 154 lb (69.854 kg)  BMI 23.42 kg/m2  SpO2 95%       Review of Systems  Constitutional: Positive for activity change and appetite change. Negative for fever, chills and fatigue.  HENT: Positive for postnasal drip and rhinorrhea. Negative for congestion, dental problem, ear pain, hearing loss, sore throat, tinnitus, trouble swallowing and voice change.   Eyes: Negative for pain, discharge and visual disturbance.  Respiratory: Positive for cough. Negative for chest tightness, wheezing and stridor.   Cardiovascular: Negative for chest pain,  palpitations and leg swelling.  Gastrointestinal: Negative for nausea, vomiting, abdominal pain, diarrhea, constipation, blood in stool and abdominal distention.  Genitourinary: Negative for urgency, hematuria, flank pain, discharge, difficulty urinating and genital sores.  Musculoskeletal: Negative for arthralgias, back pain, gait problem, joint swelling, myalgias and neck stiffness.  Skin: Negative for rash.  Neurological: Negative for dizziness, syncope, speech difficulty, weakness, numbness and headaches.  Hematological: Negative for adenopathy. Does not bruise/bleed easily.  Psychiatric/Behavioral: Negative for behavioral problems and dysphoric mood. The patient is not nervous/anxious.        Objective:   Physical Exam  Constitutional: He is oriented to person, place, and time. He appears well-developed.  HENT:  Head: Normocephalic.  Right Ear: External ear normal.  Left Ear: External ear normal.  Eyes: Conjunctivae and EOM are normal.  Neck: Normal range of motion.  Cardiovascular: Normal rate and normal heart sounds.   Pulmonary/Chest: Breath sounds normal.  Abdominal: Bowel sounds are normal.  Musculoskeletal: Normal range of motion. He exhibits no edema and no tenderness.  Neurological: He is alert and oriented to person, place, and time.  Psychiatric: He has a normal mood and affect. His behavior is normal.          Assessment & Plan:   Viral URI with cough.  Will treat symptomatically Hypertension stable Left rotator cuff tear

## 2014-05-03 NOTE — Progress Notes (Signed)
Pre-visit discussion using our clinic review tool. No additional management support is needed unless otherwise documented below in the visit note.  

## 2014-05-03 NOTE — Telephone Encounter (Signed)
Patient Information:  Caller Name: Clarance  Phone: [phone]  Patient: Dakota Green  Gender: Male  DOB: [birth year]  Age: 80 Years  PCP: Eleonore Chiquito (Family Practice > 38yrs old)  Office Follow Up:  Does the office need to follow up with this patient?: No  Instructions For The Office: N/A  RN Note:  Appt scheduled for today(05/03/14) @ 1130 with Dr Amador Cunas.  Symptoms  Reason For Call & Symptoms: Calling about deep cough x 2 weeks,sinus drainage,has been sleeping in recliner at night. Has shoulder surgery scheduled in 1.5 weeks. Wife is concerned.  Reviewed Health History In EMR: Yes  Reviewed Medications In EMR: Yes  Reviewed Allergies In EMR: Yes  Reviewed Surgeries / Procedures: Yes  Date of Onset of Symptoms: 04/19/2014  Treatments Tried: Mucinex  Treatments Tried Worked: Yes  Guideline(s) Used:  Cough  Disposition Per Guideline:   See Today or Tomorrow in Office  Reason For Disposition Reached:   Continuous (nonstop) coughing interferes with work or school and no improvement using cough treatment per Care Advice  Advice Given:  Call Back If:  You become worse.  Patient Will Follow Care Advice:  YES  Appointment Scheduled:  05/03/2014 11:30:00 Appointment Scheduled Provider:  Eleonore Chiquito (Family Practice > 32yrs old)

## 2014-05-08 ENCOUNTER — Encounter (HOSPITAL_BASED_OUTPATIENT_CLINIC_OR_DEPARTMENT_OTHER): Payer: Self-pay | Admitting: *Deleted

## 2014-05-08 NOTE — Progress Notes (Signed)
Pt saw dr Eden Emms and Dr Kirtland Bouchard recently-had a cold-better. He was here 10/13 for rt shoulder-did well-to come in for bmet-

## 2014-05-10 ENCOUNTER — Encounter (HOSPITAL_BASED_OUTPATIENT_CLINIC_OR_DEPARTMENT_OTHER)
Admission: RE | Admit: 2014-05-10 | Discharge: 2014-05-10 | Disposition: A | Discharge status: Home / Self Care | Payer: Medicare Other | Source: Ambulatory Visit | Attending: Orthopedic Surgery | Admitting: Orthopedic Surgery

## 2014-05-10 LAB — BASIC METABOLIC PANEL
BUN: 12 mg/dL (ref 6–23)
CALCIUM: 10.4 mg/dL (ref 8.4–10.5)
CO2: 31 mEq/L (ref 19–32)
Chloride: 98 mEq/L (ref 96–112)
Creatinine, Ser: 0.73 mg/dL (ref 0.50–1.35)
GFR calc Af Amer: >90 mL/min (ref >90)
GFR calc non Af Amer: 86 mL/min — ABNORMAL LOW (ref >90)
Glucose, Bld: 117 mg/dL — ABNORMAL HIGH (ref 70–99)
Potassium: 5.1 mEq/L (ref 3.7–5.3)
Sodium: 140 mEq/L (ref 137–147)

## 2014-05-14 ENCOUNTER — Encounter (HOSPITAL_BASED_OUTPATIENT_CLINIC_OR_DEPARTMENT_OTHER): Payer: Self-pay | Admitting: Certified Registered"

## 2014-05-14 ENCOUNTER — Encounter (HOSPITAL_BASED_OUTPATIENT_CLINIC_OR_DEPARTMENT_OTHER)
Admission: RE | Disposition: A | Discharge status: Home / Self Care | Payer: Self-pay | Source: Ambulatory Visit | Attending: Orthopedic Surgery

## 2014-05-14 ENCOUNTER — Encounter (HOSPITAL_BASED_OUTPATIENT_CLINIC_OR_DEPARTMENT_OTHER): Payer: Medicare Other | Admitting: Certified Registered"

## 2014-05-14 ENCOUNTER — Ambulatory Visit (HOSPITAL_BASED_OUTPATIENT_CLINIC_OR_DEPARTMENT_OTHER)
Admission: RE | Admit: 2014-05-14 | Discharge: 2014-05-14 | Disposition: A | Discharge status: Home / Self Care | Payer: Medicare Other | Source: Ambulatory Visit | Attending: Orthopedic Surgery | Admitting: Orthopedic Surgery

## 2014-05-14 ENCOUNTER — Ambulatory Visit (HOSPITAL_BASED_OUTPATIENT_CLINIC_OR_DEPARTMENT_OTHER): Payer: Medicare Other | Admitting: Certified Registered"

## 2014-05-14 ENCOUNTER — Other Ambulatory Visit: Payer: Self-pay | Admitting: Physician Assistant

## 2014-05-14 DIAGNOSIS — Z951 Presence of aortocoronary bypass graft: Secondary | ICD-10-CM | POA: Insufficient documentation

## 2014-05-14 DIAGNOSIS — Z7982 Long term (current) use of aspirin: Secondary | ICD-10-CM | POA: Insufficient documentation

## 2014-05-14 DIAGNOSIS — I252 Old myocardial infarction: Secondary | ICD-10-CM | POA: Insufficient documentation

## 2014-05-14 DIAGNOSIS — Z87891 Personal history of nicotine dependence: Secondary | ICD-10-CM | POA: Insufficient documentation

## 2014-05-14 DIAGNOSIS — IMO0002 Reserved for concepts with insufficient information to code with codable children: Secondary | ICD-10-CM | POA: Insufficient documentation

## 2014-05-14 DIAGNOSIS — J309 Allergic rhinitis, unspecified: Secondary | ICD-10-CM | POA: Insufficient documentation

## 2014-05-14 DIAGNOSIS — Z79899 Other long term (current) drug therapy: Secondary | ICD-10-CM | POA: Insufficient documentation

## 2014-05-14 DIAGNOSIS — E785 Hyperlipidemia, unspecified: Secondary | ICD-10-CM | POA: Insufficient documentation

## 2014-05-14 DIAGNOSIS — M1990 Unspecified osteoarthritis, unspecified site: Secondary | ICD-10-CM | POA: Insufficient documentation

## 2014-05-14 DIAGNOSIS — M719 Bursopathy, unspecified: Principal | ICD-10-CM | POA: Insufficient documentation

## 2014-05-14 DIAGNOSIS — M75102 Unspecified rotator cuff tear or rupture of left shoulder, not specified as traumatic: Secondary | ICD-10-CM

## 2014-05-14 DIAGNOSIS — I219 Acute myocardial infarction, unspecified: Secondary | ICD-10-CM

## 2014-05-14 DIAGNOSIS — Z9861 Coronary angioplasty status: Secondary | ICD-10-CM | POA: Insufficient documentation

## 2014-05-14 DIAGNOSIS — I10 Essential (primary) hypertension: Secondary | ICD-10-CM | POA: Insufficient documentation

## 2014-05-14 DIAGNOSIS — M67919 Unspecified disorder of synovium and tendon, unspecified shoulder: Secondary | ICD-10-CM | POA: Insufficient documentation

## 2014-05-14 DIAGNOSIS — I2510 Atherosclerotic heart disease of native coronary artery without angina pectoris: Secondary | ICD-10-CM | POA: Insufficient documentation

## 2014-05-14 HISTORY — PX: SHOULDER ARTHROSCOPY WITH ROTATOR CUFF REPAIR AND SUBACROMIAL DECOMPRESSION: SHX5686

## 2014-05-14 SURGERY — SHOULDER ARTHROSCOPY WITH ROTATOR CUFF REPAIR AND SUBACROMIAL DECOMPRESSION
Anesthesia: General | Site: Shoulder | Laterality: Left

## 2014-05-14 MED ORDER — LIDOCAINE HCL (CARDIAC) 20 MG/ML IV SOLN
INTRAVENOUS | Status: DC | PRN
  Administered 2014-05-14: 100 mg via INTRAVENOUS

## 2014-05-14 MED ORDER — FENTANYL CITRATE 0.05 MG/ML IJ SOLN
INTRAMUSCULAR | Status: AC
  Filled 2014-05-14: qty 6

## 2014-05-14 MED ORDER — SUCCINYLCHOLINE CHLORIDE 20 MG/ML IJ SOLN
INTRAMUSCULAR | Status: DC | PRN
  Administered 2014-05-14: 100 mg via INTRAVENOUS

## 2014-05-14 MED ORDER — LACTATED RINGERS IV SOLN
INTRAVENOUS | Status: DC
  Administered 2014-05-14 (×2): via INTRAVENOUS

## 2014-05-14 MED ORDER — MIDAZOLAM HCL 2 MG/2ML IJ SOLN
INTRAMUSCULAR | Status: AC
  Filled 2014-05-14: qty 2

## 2014-05-14 MED ORDER — SODIUM CHLORIDE 0.9 % IR SOLN
Status: DC | PRN
  Administered 2014-05-14: 9000 mL

## 2014-05-14 MED ORDER — CHLORHEXIDINE GLUCONATE 4 % EX LIQD
60.0000 mL | Freq: Once | CUTANEOUS | Status: DC
Start: 2014-05-14 — End: 2014-05-14

## 2014-05-14 MED ORDER — MIDAZOLAM HCL 2 MG/2ML IJ SOLN
1.0000 mg | INTRAMUSCULAR | Status: DC | PRN
  Administered 2014-05-14: 2 mg via INTRAVENOUS

## 2014-05-14 MED ORDER — HYDROMORPHONE HCL PF 1 MG/ML IJ SOLN: 0.2500 mg | INTRAMUSCULAR | Status: DC | PRN

## 2014-05-14 MED ORDER — FENTANYL CITRATE 0.05 MG/ML IJ SOLN
INTRAMUSCULAR | Status: AC
  Filled 2014-05-14: qty 2

## 2014-05-14 MED ORDER — BUPIVACAINE-EPINEPHRINE (PF) 0.5% -1:200000 IJ SOLN
INTRAMUSCULAR | Status: DC | PRN
  Administered 2014-05-14: 30 mL via PERINEURAL

## 2014-05-14 MED ORDER — DEXAMETHASONE SODIUM PHOSPHATE 4 MG/ML IJ SOLN
INTRAMUSCULAR | Status: DC | PRN
  Administered 2014-05-14: 8 mg via INTRAVENOUS

## 2014-05-14 MED ORDER — CEFAZOLIN SODIUM-DEXTROSE 2-3 GM-% IV SOLR
2.0000 g | INTRAVENOUS | Status: AC
  Administered 2014-05-14: 2 g via INTRAVENOUS

## 2014-05-14 MED ORDER — FENTANYL CITRATE 0.05 MG/ML IJ SOLN
50.0000 ug | INTRAMUSCULAR | Status: DC | PRN
  Administered 2014-05-14: 50 ug via INTRAVENOUS

## 2014-05-14 MED ORDER — ONDANSETRON HCL 4 MG/2ML IJ SOLN
INTRAMUSCULAR | Status: DC | PRN
  Administered 2014-05-14: 4 mg via INTRAVENOUS

## 2014-05-14 MED ORDER — MEPERIDINE HCL 25 MG/ML IJ SOLN: 6.2500 mg | INTRAMUSCULAR | Status: DC | PRN

## 2014-05-14 MED ORDER — MIDAZOLAM HCL 5 MG/5ML IJ SOLN
INTRAMUSCULAR | Status: DC | PRN
  Administered 2014-05-14: 2 mg via INTRAVENOUS

## 2014-05-14 MED ORDER — HYDROCODONE-ACETAMINOPHEN 10-325 MG PO TABS: 1.0000 | ORAL_TABLET | Freq: Four times a day (QID) | ORAL | Status: DC | PRN

## 2014-05-14 MED ORDER — EPINEPHRINE HCL 1 MG/ML IJ SOLN
INTRAMUSCULAR | Status: AC
  Filled 2014-05-14: qty 1

## 2014-05-14 MED ORDER — PHENYLEPHRINE HCL 10 MG/ML IJ SOLN
10.0000 mg | INTRAVENOUS | Status: DC | PRN
  Administered 2014-05-14: 50 ug/min via INTRAVENOUS

## 2014-05-14 MED ORDER — OXYCODONE HCL 5 MG/5ML PO SOLN: 5.0000 mg | Freq: Once | ORAL | Status: DC | PRN

## 2014-05-14 MED ORDER — EPINEPHRINE HCL 1 MG/ML IJ SOLN
INTRAMUSCULAR | Status: DC | PRN
  Administered 2014-05-14: 3 mL

## 2014-05-14 MED ORDER — ONDANSETRON HCL 4 MG/2ML IJ SOLN: 4.0000 mg | Freq: Once | INTRAMUSCULAR | Status: DC | PRN

## 2014-05-14 MED ORDER — BUPIVACAINE-EPINEPHRINE (PF) 0.25% -1:200000 IJ SOLN
INTRAMUSCULAR | Status: AC
  Filled 2014-05-14: qty 30

## 2014-05-14 MED ORDER — PROPOFOL 10 MG/ML IV BOLUS
INTRAVENOUS | Status: DC | PRN
  Administered 2014-05-14: 150 mg via INTRAVENOUS

## 2014-05-14 MED ORDER — DIAZEPAM 2 MG PO TABS: 2.0000 mg | ORAL_TABLET | Freq: Three times a day (TID) | ORAL | Status: DC | PRN

## 2014-05-14 MED ORDER — OXYCODONE HCL 5 MG PO TABS: 5.0000 mg | ORAL_TABLET | Freq: Once | ORAL | Status: DC | PRN

## 2014-05-14 SURGICAL SUPPLY — 82 items
APL SKNCLS STERI-STRIP NONHPOA (GAUZE/BANDAGES/DRESSINGS)
BENZOIN TINCTURE PRP APPL 2/3 (GAUZE/BANDAGES/DRESSINGS) IMPLANT
BLADE CUDA 5.5 (BLADE) IMPLANT
BLADE CUTTER GATOR 3.5 (BLADE) ×3 IMPLANT
BLADE GREAT WHITE 4.2 (BLADE) IMPLANT
BLADE GREAT WHITE 4.2MM (BLADE)
BLADE SURG 15 STRL LF DISP TIS (BLADE) IMPLANT
BLADE SURG 15 STRL SS (BLADE)
BLADE SURG ROTATE 9660 (MISCELLANEOUS) IMPLANT
BNDG COHESIVE 4X5 TAN STRL (GAUZE/BANDAGES/DRESSINGS) IMPLANT
BUR OVAL 6.0 (BURR) ×3 IMPLANT
CANISTER SUCT 3000ML (MISCELLANEOUS) IMPLANT
CANNULA TWIST IN 8.25X7CM (CANNULA) IMPLANT
CLOSURE WOUND 1/2 X4 (GAUZE/BANDAGES/DRESSINGS)
DECANTER SPIKE VIAL GLASS SM (MISCELLANEOUS) IMPLANT
DRAPE SHOULDER BEACH CHAIR (DRAPES) ×3 IMPLANT
DRAPE U-SHAPE 47X51 STRL (DRAPES) ×6 IMPLANT
DRSG PAD ABDOMINAL 8X10 ST (GAUZE/BANDAGES/DRESSINGS) ×3 IMPLANT
DURAPREP 26ML APPLICATOR (WOUND CARE) ×3 IMPLANT
ELECT REM PT RETURN 9FT ADLT (ELECTROSURGICAL) ×3
ELECTRODE REM PT RTRN 9FT ADLT (ELECTROSURGICAL) ×1 IMPLANT
GAUZE SPONGE 4X4 12PLY STRL (GAUZE/BANDAGES/DRESSINGS) ×3 IMPLANT
GAUZE XEROFORM 1X8 LF (GAUZE/BANDAGES/DRESSINGS) ×3 IMPLANT
GLOVE BIO SURGEON STRL SZ7 (GLOVE) ×2 IMPLANT
GLOVE BIOGEL PI IND STRL 7.0 (GLOVE) IMPLANT
GLOVE BIOGEL PI IND STRL 7.5 (GLOVE) ×1 IMPLANT
GLOVE BIOGEL PI INDICATOR 7.0 (GLOVE) ×6
GLOVE BIOGEL PI INDICATOR 7.5 (GLOVE) ×2
GLOVE ECLIPSE 6.5 STRL STRAW (GLOVE) ×2 IMPLANT
GLOVE ECLIPSE 7.0 STRL STRAW (GLOVE) ×2 IMPLANT
GLOVE SS BIOGEL STRL SZ 7.5 (GLOVE) ×1 IMPLANT
GLOVE SUPERSENSE BIOGEL SZ 7.5 (GLOVE) ×2
GOWN STRL REUS W/ TWL LRG LVL3 (GOWN DISPOSABLE) ×3 IMPLANT
GOWN STRL REUS W/TWL LRG LVL3 (GOWN DISPOSABLE) ×9
LOOP 2 FIBERLINK CLOSED (SUTURE) IMPLANT
MANIFOLD NEPTUNE II (INSTRUMENTS) ×3 IMPLANT
NDL 1/2 CIR CATGUT .05X1.09 (NEEDLE) IMPLANT
NDL SAFETY ECLIPSE 18X1.5 (NEEDLE) ×1 IMPLANT
NDL SCORPION MULTI FIRE (NEEDLE) IMPLANT
NDL SUT 6 .5 CRC .975X.05 MAYO (NEEDLE) IMPLANT
NEEDLE 1/2 CIR CATGUT .05X1.09 (NEEDLE) IMPLANT
NEEDLE HYPO 18GX1.5 SHARP (NEEDLE) ×3
NEEDLE MAYO TAPER (NEEDLE)
NEEDLE SCORPION MULTI FIRE (NEEDLE) IMPLANT
PACK ARTHROSCOPY DSU (CUSTOM PROCEDURE TRAY) ×3 IMPLANT
PACK BASIN DAY SURGERY FS (CUSTOM PROCEDURE TRAY) ×3 IMPLANT
PAD ALCOHOL SWAB (MISCELLANEOUS) ×6 IMPLANT
PENCIL BUTTON HOLSTER BLD 10FT (ELECTRODE) IMPLANT
SET ARTHROSCOPY TUBING (MISCELLANEOUS) ×3
SET ARTHROSCOPY TUBING LN (MISCELLANEOUS) ×1 IMPLANT
SHEET MEDIUM DRAPE 40X70 STRL (DRAPES) IMPLANT
SLEEVE SCD COMPRESS KNEE MED (MISCELLANEOUS) IMPLANT
SLING ARM IMMOBILIZER MED (SOFTGOODS) IMPLANT
SLING ARM LRG ADULT FOAM STRAP (SOFTGOODS) ×2 IMPLANT
SLING ARM MED ADULT FOAM STRAP (SOFTGOODS) IMPLANT
SLING ARM XL FOAM STRAP (SOFTGOODS) IMPLANT
SLING ULTRA III MED (ORTHOPEDIC SUPPLIES) IMPLANT
SPONGE LAP 4X18 X RAY DECT (DISPOSABLE) IMPLANT
STRIP CLOSURE SKIN 1/2X4 (GAUZE/BANDAGES/DRESSINGS) IMPLANT
SUCTION FRAZIER TIP 10 FR DISP (SUCTIONS) IMPLANT
SUT ETHILON 3 0 PS 1 (SUTURE) ×3 IMPLANT
SUT FIBERWIRE #2 38 T-5 BLUE (SUTURE)
SUT PDS AB 2-0 CT2 27 (SUTURE) IMPLANT
SUT PROLENE 3 0 PS 2 (SUTURE) IMPLANT
SUT TIGER TAPE 7 IN WHITE (SUTURE) IMPLANT
SUT VIC AB 0 SH 27 (SUTURE) IMPLANT
SUT VIC AB 2-0 PS2 27 (SUTURE) IMPLANT
SUT VIC AB 2-0 SH 27 (SUTURE)
SUT VIC AB 2-0 SH 27XBRD (SUTURE) IMPLANT
SUTURE FIBERWR #2 38 T-5 BLUE (SUTURE) IMPLANT
SYR 20CC LL (SYRINGE) IMPLANT
SYR 5ML LL (SYRINGE) ×3 IMPLANT
SYR BULB 3OZ (MISCELLANEOUS) IMPLANT
TAPE FIBER 2MM 7IN #2 BLUE (SUTURE) IMPLANT
TAPE HYPAFIX 6 X30' (GAUZE/BANDAGES/DRESSINGS)
TAPE HYPAFIX 6X30 (GAUZE/BANDAGES/DRESSINGS) IMPLANT
TAPE STRIPS DRAPE STRL (GAUZE/BANDAGES/DRESSINGS) ×3 IMPLANT
TOWEL OR 17X24 6PK STRL BLUE (TOWEL DISPOSABLE) ×3 IMPLANT
TUBE CONNECTING 20'X1/4 (TUBING)
TUBE CONNECTING 20X1/4 (TUBING) IMPLANT
WAND STAR VAC 90 (SURGICAL WAND) ×3 IMPLANT
WATER STERILE IRR 1000ML POUR (IV SOLUTION) ×3 IMPLANT

## 2014-05-14 NOTE — Anesthesia Preprocedure Evaluation (Addendum)
Anesthesia Evaluation  Patient identified by MRN, date of birth, ID band Patient awake    Reviewed: Allergy & Precautions, H&P , NPO status , Patient's Chart, lab work & pertinent test results  Airway Mallampati: I TM Distance: >3 FB Neck ROM: Full    Dental   Pulmonary former smoker,          Cardiovascular hypertension, Pt. on medications + CAD and + CABG     Neuro/Psych    GI/Hepatic   Endo/Other    Renal/GU      Musculoskeletal   Abdominal   Peds  Hematology   Anesthesia Other Findings   Reproductive/Obstetrics                        Anesthesia Physical Anesthesia Plan  ASA: III  Anesthesia Plan: General   Post-op Pain Management:    Induction: Intravenous  Airway Management Planned: Oral ETT  Additional Equipment:   Intra-op Plan:   Post-operative Plan: Extubation in OR  Informed Consent: I have reviewed the patients History and Physical, chart, labs and discussed the procedure including the risks, benefits and alternatives for the proposed anesthesia with the patient or authorized representative who has indicated his/her understanding and acceptance.     Plan Discussed with: CRNA and Surgeon  Anesthesia Plan Comments:         Anesthesia Quick Evaluation

## 2014-05-14 NOTE — Anesthesia Procedure Notes (Addendum)
Anesthesia Regional Block:  Interscalene brachial plexus block  Pre-Anesthetic Checklist: ,, timeout performed, Correct Patient, Correct Site, Correct Laterality, Correct Procedure, Correct Position, site marked, Risks and benefits discussed,  Surgical consent,  Pre-op evaluation,  At surgeon's request and post-op pain management  Laterality: Left  Prep: chloraprep       Needles:  Injection technique: Single-shot  Needle Type: Echogenic Stimulator Needle     Needle Length: 9cm 9 cm Needle Gauge: 21 and 21 G    Additional Needles:  Procedures: ultrasound guided (picture in chart) and nerve stimulator Interscalene brachial plexus block  Nerve Stimulator or Paresthesia:  Response: 0.4 mA,   Additional Responses:   Narrative:  Start time: 05/14/2014 12:00 PM End time: 05/14/2014 12:10 PM Injection made incrementally with aspirations every 5 mL.  Performed by: Personally  Anesthesiologist: Arta Bruce MD  Additional Notes: Monitors applied. Patient sedated. Sterile prep and drape,hand hygiene and sterile gloves were used. Relevant anatomy identified.Needle position confirmed.Local anesthetic injected incrementally after negative aspiration. Local anesthetic spread visualized around nerve(s). Vascular puncture avoided. No complications. Image printed for medical record.The patient tolerated the procedure well.        Procedure Name: Intubation Date/Time: 05/14/2014 12:45 PM Performed by: Gar Gibbon Pre-anesthesia Checklist: Patient identified, Emergency Drugs available, Suction available and Patient being monitored Patient Re-evaluated:Patient Re-evaluated prior to inductionOxygen Delivery Method: Circle System Utilized Preoxygenation: Pre-oxygenation with 100% oxygen Intubation Type: IV induction Ventilation: Mask ventilation without difficulty Laryngoscope Size: Mac and 3 Grade View: Grade III Tube type: Oral Tube size: 8.0 mm Number of attempts: 1 Airway  Equipment and Method: stylet and oral airway Placement Confirmation: ETT inserted through vocal cords under direct vision,  positive ETCO2 and breath sounds checked- equal and bilateral Secured at: 22 cm Tube secured with: Tape Dental Injury: Teeth and Oropharynx as per pre-operative assessment

## 2014-05-14 NOTE — Discharge Instructions (Signed)
Regional Anesthesia Blocks ° °1. Numbness or the inability to move the "blocked" extremity may last from 3-48 hours after placement. The length of time depends on the medication injected and your individual response to the medication. If the numbness is not going away after 48 hours, call your surgeon. ° °2. The extremity that is blocked will need to be protected until the numbness is gone and the  Strength has returned. Because you cannot feel it, you will need to take extra care to avoid injury. Because it may be weak, you may have difficulty moving it or using it. You may not know what position it is in without looking at it while the block is in effect. ° °3. For blocks in the legs and feet, returning to weight bearing and walking needs to be done carefully. You will need to wait until the numbness is entirely gone and the strength has returned. You should be able to move your leg and foot normally before you try and bear weight or walk. You will need someone to be with you when you first try to ensure you do not fall and possibly risk injury. ° °4. Bruising and tenderness at the needle site are common side effects and will resolve in a few days. ° °5. Persistent numbness or new problems with movement should be communicated to the surgeon or the [facility] ([phone])/ [facility] ([phone]). ° ° ° °Post Anesthesia Home Care Instructions ° °Activity: °Get plenty of rest for the remainder of the day. A responsible adult should stay with you for 24 hours following the procedure.  °For the next 24 hours, DO NOT: °-Drive a car °-Operate machinery °-Drink alcoholic beverages °-Take any medication unless instructed by your physician °-Make any legal decisions or sign important papers. ° °Meals: °Start with liquid foods such as gelatin or soup. Progress to regular foods as tolerated. Avoid greasy, spicy, heavy foods. If nausea and/or vomiting occur, drink only clear liquids until the  nausea and/or vomiting subsides. Call your physician if vomiting continues. ° °Special Instructions/Symptoms: °Your throat may feel dry or sore from the anesthesia or the breathing tube placed in your throat during surgery. If this causes discomfort, gargle with warm salt water. The discomfort should disappear within 24 hours. ° °

## 2014-05-14 NOTE — Anesthesia Postprocedure Evaluation (Signed)
  Anesthesia Post-op Note  Patient: Dakota Green  Procedure(s) Performed: Procedure(s): LEFT SHOULDER ARTHROSCOPY WITH DEBRIDEMENT EXTENSIVE, DISAL CLAVICULECTOMY, SUBACROMIAL DECOMPRESSION PARTIAL ACROMIOPLASTY WITH CORACOACROMIAL RELEASE AND ROTATOR CUFF REPAIR (Left)  Patient Location: PACU  Anesthesia Type:GA combined with regional for post-op pain  Level of Consciousness: awake, alert  and oriented  Airway and Oxygen Therapy: Patient Spontanous Breathing  Post-op Pain: none  Post-op Assessment: Post-op Vital signs reviewed  Post-op Vital Signs: Reviewed  Last Vitals:  Filed Vitals:   05/14/14 1445  BP: 119/60  Pulse: 61  Temp:   Resp: 14    Complications: No apparent anesthesia complications

## 2014-05-14 NOTE — Progress Notes (Signed)
AssistedDr. Ossey with left, ultrasound guided, interscalene  block. Side rails up, monitors on throughout procedure. See vital signs in flow sheet. Tolerated Procedure well.  

## 2014-05-14 NOTE — Interval H&P Note (Signed)
History and Physical Interval Note:  05/14/2014 12:28 PM  Dakota Green  has presented today for surgery, with the diagnosis of Left Shoulder: Disorder Articular Cartilage - Shoulder, Degenerative Arthritis - Shoulder - Primary Diagnosis, disorders of Bursae and Tendons in Shoulder Region, Unspecified Complete  The various methods of treatment have been discussed with the patient and family. After consideration of risks, benefits and other options for treatment, the patient has consented to  Procedure(s): LEFT SHOULDER ARTHROSCOPY WITH DEBRIDEMENT EXTENSIVE, DISAL CLAVICULECTOMY, SUBACROMIAL DECOMPRESSION PARTIAL ACROMIOPLASTY WITH CORACOACROMIAL RELEASE AND ROTATOR CUFF REPAIR (Left) as a surgical intervention .  The patient's history has been reviewed, patient examined, no change in status, stable for surgery.  I have reviewed the patient's chart and labs.  Questions were answered to the patient's satisfaction.     [name] A

## 2014-05-14 NOTE — H&P (View-Only) (Signed)
Dakota CageyJohn B Green is an 80 y.o. male.   Chief Complaint: left shoulder pain HPI: Dakota Green is a 80 year-old seen for evaluation for significant left shoulder pain.  This has been bothering him for the past six months.  Getting progressively worse.  Pain with overhead use and activity and night pain as well.  He has been seeing Dr. Charlett BlakeVoytek for this.  He underwent an MRI on Apr 04, 2014 that revealed a rotator cuff tear without retraction with impingement and AC joint arthropathy.  He had undergone right shoulder rotator cuff repair in 2013 and did well with this.  He sees Dr. Amador CunasKwiatkowski for his regular medical care and sees Dr. Elease HashimotoNahser for his cardiac disease and is on Aspirin and Plavix.  He is status post CABG.  He did well with his right shoulder [facility], which was cleared at that time by Dr. Elease HashimotoNahser and Dr. Amador CunasKwiatkowski.    Past Medical History  Diagnosis Date  . ALLERGIC RHINITIS 05/04/2007  . CORONARY ARTERY DISEASE 05/04/2007  . DIVERTICULOSIS, COLON 08/14/2009  . ESOPHAGEAL STRICTURE 08/14/2009  . GERD 05/04/2007  . HIATAL HERNIA 08/14/2009  . HYPERLIPIDEMIA 05/04/2007  . HYPERTENSION 05/04/2007  . Osteoarth NOS-Unspec 05/04/2007  . PEPTIC ULCER DISEASE 05/04/2007    bleeding ulcer with hospitalization  . DJD (degenerative joint disease)   . Myocardial infarction 1987  . Torn rotator cuff     right    Past Surgical History  Procedure Laterality Date  . Coronary artery bypass graft    . Cataract extraction    . Coronary angioplasty with stent placement    . Cardiac catheterization  06/12/2007    EF 45%  . Cardiac catheterization  01/18/2005    EF 50-55%  . Cardiac catheterization  01/14/2003    EF 55%  . [place] echocardiography  08/23/2005    EF 50-55%  . Cardiovascular stress test  02/07/2001  . Tonsillectomy    . Shoulder arthroscopy  2013    right    Family History  Problem Relation Age of Onset  . Heart attack Mother    Social History:  reports that he quit  smoking about 28 years ago. He has never used smokeless tobacco. He reports that he drinks alcohol. He reports that he does not use illicit drugs.  Allergies:  Allergies  Allergen Reactions  . Crestor [Rosuvastatin Calcium]     Aches    Current Outpatient Prescriptions on File Prior to Visit  Medication Sig Dispense Refill  . ALPHAGAN P 0.1 % SOLN Place 1 drop into the right eye 2 (two) times daily.       Marland Kitchen. aspirin 81 MG tablet Take 81 mg by mouth daily.        . benazepril (LOTENSIN) 10 MG tablet TAKE 1 TABLET DAILY  90 tablet  1  . celecoxib (CELEBREX) 200 MG capsule TAKE 1 CAPSULE BY MOUTH 2 TIMES A DAY  60 capsule  3  . clopidogrel (PLAVIX) 75 MG tablet Take 1 tablet (75 mg total) by mouth daily.  90 tablet  5  . esomeprazole (NEXIUM) 40 MG capsule Take 1 capsule (40 mg total) by mouth daily before breakfast.  90 capsule  6  . fish oil-omega-3 fatty acids 1000 MG capsule Take 1,000 mg by mouth 3 (three) times daily.        Marland Kitchen. HYDROcodone-homatropine (HYCODAN) 5-1.5 MG/5ML syrup Take 5 mLs by mouth every 6 (six) hours as needed for cough.  120 mL  0  . loratadine (CLARITIN) 10 MG tablet Take 10 mg by mouth daily.        . Multiple Vitamins-Minerals (PRESERVISION/LUTEIN PO) Take by mouth daily.        . predniSONE (DELTASONE) 5 MG tablet Take 1 tablet (5 mg total) by mouth daily.  90 tablet  3  . tamsulosin (FLOMAX) 0.4 MG CAPS capsule TAKE 1 CAPSULE (0.4 MG TOTAL) BY MOUTH DAILY.  90 capsule  3  . temazepam (RESTORIL) 30 MG capsule TAKE 1 CAPSULE AT BEDTIME  30 capsule  5   No current facility-administered medications on file prior to visit.    (Not in a hospital admission)  No results found for this or any previous visit (from the past 48 hour(s)). No results found.  Review of Systems  Constitutional: Negative.   HENT: Negative.   Eyes: Negative.   Respiratory: Negative.   Cardiovascular: Negative.   Gastrointestinal: Negative.   Genitourinary: Negative.   Musculoskeletal:  Positive for joint pain.       Left shoulder pain   Skin: Negative.   Neurological: Negative.   Endo/Heme/Allergies: Negative.   Psychiatric/Behavioral: Negative.     Blood pressure 122/72, pulse 65, height 5\' 10"  (1.778 m), weight 73.936 kg (163 lb). Physical Exam  Constitutional: He is oriented to person, place, and time. He appears well-developed.  HENT:  Head: Normocephalic.  Mouth/Throat: Oropharynx is clear and moist.  Eyes: Conjunctivae and EOM are normal. Pupils are equal, round, and reactive to light.  Neck: Neck supple.  Cardiovascular: Normal rate.   Respiratory: Effort normal.  GI: Soft.  Genitourinary:  Not pertinent to current symptomatology therefore not examined.  Musculoskeletal:  Examination of his left shoulder reveals forward flexion of 170 with pain and mild weakness.  Abduction of 160 with pain and mild weakness.  Internal and external rotation of 60 degrees with pain and mild weakness.  No instability.  Examination of his right shoulder reveals full range of motion without pain, weakness or instability.    Neurological: He is alert and oriented to person, place, and time.  Skin: Skin is warm and dry.  Psychiatric: He has a normal mood and affect.     Assessment Patient Active Problem List   Diagnosis Date Noted  . Rotator cuff tear, left surgery 05/14/2014 05/14/2014  . Myocardial infarction   . Torn rotator cuff, right surgery 2014   . DJD (degenerative joint disease)   . Lumbar spinal stenosis 05/07/2011  . Cervical pain 05/07/2011  . ESOPHAGEAL STRICTURE 08/14/2009  . HIATAL HERNIA 08/14/2009  . DIVERTICULOSIS, COLON 08/14/2009  . HYPERLIPIDEMIA 05/04/2007  . HYPERTENSION 05/04/2007  . CORONARY ARTERY DISEASE 05/04/2007  . ALLERGIC RHINITIS 05/04/2007  . GERD 05/04/2007  . PEPTIC ULCER DISEASE 05/04/2007  . Osteoarth NOS-Unspec 05/04/2007    Plan At this point I have talked to him and his wife about this in detail.  Would recommend with these  findings and his persistent pain and lack of response to conservative care, that we proceed with left shoulder arthroscopy with rotator cuff repair with subacromial decompression and DCE.  Risks, complications and benefits of the surgery have been described to him in detail and he understands this completely.    SHEPPERSON,KIRSTIN J 05/14/2014, 9:42 AM

## 2014-05-14 NOTE — Transfer of Care (Signed)
Immediate Anesthesia Transfer of Care Note  Patient: Dakota Green  Procedure(s) Performed: Procedure(s): LEFT SHOULDER ARTHROSCOPY WITH DEBRIDEMENT EXTENSIVE, DISAL CLAVICULECTOMY, SUBACROMIAL DECOMPRESSION PARTIAL ACROMIOPLASTY WITH CORACOACROMIAL RELEASE AND ROTATOR CUFF REPAIR (Left)  Patient Location: PACU  Anesthesia Type:GA combined with regional for post-op pain  Level of Consciousness: awake, sedated and patient cooperative  Airway & Oxygen Therapy: Patient Spontanous Breathing and Patient connected to face mask oxygen  Post-op Assessment: Report given to PACU RN and Post -op Vital signs reviewed and stable  Post vital signs: Reviewed and stable  Complications: No apparent anesthesia complications

## 2014-05-14 NOTE — H&P (Signed)
Dakota Green is an 80 y.o. male.   Chief Complaint: left shoulder pain HPI: Dakota Green is a 80 year-old seen for evaluation for significant left shoulder pain.  This has been bothering him for the past six months.  Getting progressively worse.  Pain with overhead use and activity and night pain as well.  He has been seeing Dr. Voytek for this.  He underwent an MRI on Apr 04, 2014 that revealed a rotator cuff tear without retraction with impingement and AC joint arthropathy.  He had undergone right shoulder rotator cuff repair in 2013 and did well with this.  He sees Dr. Kwiatkowski for his regular medical care and sees Dr. Nahser for his cardiac disease and is on Aspirin and Plavix.  He is status post CABG.  He did well with his right shoulder rotator cuff repair at [facility], which was cleared at that time by Dr. Nahser and Dr. Kwiatkowski.    Past Medical History  Diagnosis Date  . ALLERGIC RHINITIS 05/04/2007  . CORONARY ARTERY DISEASE 05/04/2007  . DIVERTICULOSIS, COLON 08/14/2009  . ESOPHAGEAL STRICTURE 08/14/2009  . GERD 05/04/2007  . HIATAL HERNIA 08/14/2009  . HYPERLIPIDEMIA 05/04/2007  . HYPERTENSION 05/04/2007  . Osteoarth NOS-Unspec 05/04/2007  . PEPTIC ULCER DISEASE 05/04/2007    bleeding ulcer with hospitalization  . DJD (degenerative joint disease)   . Myocardial infarction 1987  . Torn rotator cuff     right    Past Surgical History  Procedure Laterality Date  . Coronary artery bypass graft    . Cataract extraction    . Coronary angioplasty with stent placement    . Cardiac catheterization  06/12/2007    EF 45%  . Cardiac catheterization  01/18/2005    EF 50-55%  . Cardiac catheterization  01/14/2003    EF 55%  . [place] echocardiography  08/23/2005    EF 50-55%  . Cardiovascular stress test  02/07/2001  . Tonsillectomy    . Shoulder arthroscopy  2013    right    Family History  Problem Relation Age of Onset  . Heart attack Mother    Social History:  reports that he quit  smoking about 28 years ago. He has never used smokeless tobacco. He reports that he drinks alcohol. He reports that he does not use illicit drugs.  Allergies:  Allergies  Allergen Reactions  . Crestor [Rosuvastatin Calcium]     Aches    Current Outpatient Prescriptions on File Prior to Visit  Medication Sig Dispense Refill  . ALPHAGAN P 0.1 % SOLN Place 1 drop into the right eye 2 (two) times daily.       . aspirin 81 MG tablet Take 81 mg by mouth daily.        . benazepril (LOTENSIN) 10 MG tablet TAKE 1 TABLET DAILY  90 tablet  1  . celecoxib (CELEBREX) 200 MG capsule TAKE 1 CAPSULE BY MOUTH 2 TIMES A DAY  60 capsule  3  . clopidogrel (PLAVIX) 75 MG tablet Take 1 tablet (75 mg total) by mouth daily.  90 tablet  5  . esomeprazole (NEXIUM) 40 MG capsule Take 1 capsule (40 mg total) by mouth daily before breakfast.  90 capsule  6  . fish oil-omega-3 fatty acids 1000 MG capsule Take 1,000 mg by mouth 3 (three) times daily.        . HYDROcodone-homatropine (HYCODAN) 5-1.5 MG/5ML syrup Take 5 mLs by mouth every 6 (six) hours as needed for cough.    120 mL  0  . loratadine (CLARITIN) 10 MG tablet Take 10 mg by mouth daily.        . Multiple Vitamins-Minerals (PRESERVISION/LUTEIN PO) Take by mouth daily.        . predniSONE (DELTASONE) 5 MG tablet Take 1 tablet (5 mg total) by mouth daily.  90 tablet  3  . tamsulosin (FLOMAX) 0.4 MG CAPS capsule TAKE 1 CAPSULE (0.4 MG TOTAL) BY MOUTH DAILY.  90 capsule  3  . temazepam (RESTORIL) 30 MG capsule TAKE 1 CAPSULE AT BEDTIME  30 capsule  5   No current facility-administered medications on file prior to visit.    (Not in a hospital admission)  No results found for this or any previous visit (from the past 48 hour(s)). No results found.  Review of Systems  Constitutional: Negative.   HENT: Negative.   Eyes: Negative.   Respiratory: Negative.   Cardiovascular: Negative.   Gastrointestinal: Negative.   Genitourinary: Negative.   Musculoskeletal:  Positive for joint pain.       Left shoulder pain   Skin: Negative.   Neurological: Negative.   Endo/Heme/Allergies: Negative.   Psychiatric/Behavioral: Negative.     Blood pressure 122/72, pulse 65, height 5\' 10"  (1.778 m), weight 73.936 kg (163 lb). Physical Exam  Constitutional: He is oriented to person, place, and time. He appears well-developed.  HENT:  Head: Normocephalic.  Mouth/Throat: Oropharynx is clear and moist.  Eyes: Conjunctivae and EOM are normal. Pupils are equal, round, and reactive to light.  Neck: Neck supple.  Cardiovascular: Normal rate.   Respiratory: Effort normal.  GI: Soft.  Genitourinary:  Not pertinent to current symptomatology therefore not examined.  Musculoskeletal:  Examination of his left shoulder reveals forward flexion of 170 with pain and mild weakness.  Abduction of 160 with pain and mild weakness.  Internal and external rotation of 60 degrees with pain and mild weakness.  No instability.  Examination of his right shoulder reveals full range of motion without pain, weakness or instability.    Neurological: He is alert and oriented to person, place, and time.  Skin: Skin is warm and dry.  Psychiatric: He has a normal mood and affect.     Assessment Patient Active Problem List   Diagnosis Date Noted  . Rotator cuff tear, left surgery 05/14/2014 05/14/2014  . Myocardial infarction   . Torn rotator cuff, right surgery 2014   . DJD (degenerative joint disease)   . Lumbar spinal stenosis 05/07/2011  . Cervical pain 05/07/2011  . ESOPHAGEAL STRICTURE 08/14/2009  . HIATAL HERNIA 08/14/2009  . DIVERTICULOSIS, COLON 08/14/2009  . HYPERLIPIDEMIA 05/04/2007  . HYPERTENSION 05/04/2007  . CORONARY ARTERY DISEASE 05/04/2007  . ALLERGIC RHINITIS 05/04/2007  . GERD 05/04/2007  . PEPTIC ULCER DISEASE 05/04/2007  . Osteoarth NOS-Unspec 05/04/2007    Plan At this point I have talked to him and his wife about this in detail.  Would recommend with these  findings and his persistent pain and lack of response to conservative care, that we proceed with left shoulder arthroscopy with rotator cuff repair with subacromial decompression and DCE.  Risks, complications and benefits of the surgery have been described to him in detail and he understands this completely.    SHEPPERSON,KIRSTIN J 05/14/2014, 9:42 AM

## 2014-05-15 ENCOUNTER — Encounter (HOSPITAL_BASED_OUTPATIENT_CLINIC_OR_DEPARTMENT_OTHER): Payer: Self-pay | Admitting: Orthopedic Surgery

## 2014-05-15 LAB — POCT HEMOGLOBIN-HEMACUE: Hemoglobin: 14.1 g/dL (ref 13.0–17.0)

## 2014-05-17 NOTE — Op Note (Signed)
NAME:  Dakota Green, Dakota Green NO.:  [id number]  MEDICAL RECORD NO.:  [id number]  LOCATION:                                FACILITY:  [facility]  PHYSICIAN:  Dakota Green, M.D. DATE OF BIRTH:  [birth year]  DATE OF PROCEDURE:  05/14/2014 DATE OF DISCHARGE:  05/14/2014                              OPERATIVE REPORT   PREOPERATIVE DIAGNOSIS: 1. Left shoulder partial rotator cuff tear with partial labrum tear. 2. Left shoulder impingement. 3. 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  POSTOPERATIVE DIAGNOSIS: 1. Left shoulder partial rotator cuff tear with partial labrum tear. 2. Left shoulder impingement. 3. 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  PROCEDURE: 1. Left shoulder EUA followed by arthroscopic debridement, partial     rotator cuff and partial labrum tear. 2. Left shoulder subacromial decompression. 3. Left shoulder distal clavicle excision.  SURGEON:  Dakota Green, M.D.  ASSISTANT:  Dakota Girt, PA-C  ANESTHESIA:  General.  OPERATIVE TIME:  45 minutes.  COMPLICATIONS:  None.  INDICATION FOR PROCEDURE:  Dakota Green is a 80 year old gentleman, who has had significant increasing left shoulder pain over the past 6-9 months with exam and MRI documenting partial versus complete rotator cuff tear with partial labrum tear with impingement and AC joint arthropathy.  He has failed multiple conservative modalities and is now to undergo arthroscopy.  DESCRIPTION OF PROCEDURE:  Dakota Green was brought to the operating room on May 14, 2014, after an interscalene block was placed on holding room by Anesthesia.  He was placed on operative table in supine position.  He received antibiotics preoperatively for prophylaxis.  After being placed under general anesthesia, his left shoulder was examined.  He had full range of motion and shoulder was stable to ligamentous exam.  He was then placed in beach chair position  and his shoulder and arm was prepped using sterile DuraPrep and draped using sterile technique.  Time-out procedure was called and the correct left shoulder identified. Initially, through a posterior arthroscopic portal, the arthroscope with a pump attached was placed into an anterior portal and arthroscopic probe was placed.  On initial inspection, the articular cartilage in the glenohumeral joint was intact.  He had partial tearing of the anterior, superior, and posterior labrum 25% which was debrided.  The anterior- inferior labrum and anterior-inferior glenohumeral ligament complex was intact.  The biceps tendon anchor was slightly hypermobile, but was otherwise well attached.  Biceps tendon showed partial tearing 25% which was debrided.  Rotator cuff showed partial tearing 30-40% of supraspinatus and infraspinatus, which was debrided, but it was otherwise well attached.  He had 75-80% partial tearing of the subscapularis, which was debrided.  Teres minor was intact.  Inferior capsular recess was free of pathology.  Subacromial space showed a large amount of bursitis, which was resected.  The rotator cuff was somewhat frayed on the bursal surface, but no deep tears was noted.  Impingement was noted and a subacromial decompression was carried out removing 6-8 mm of the undersurface of the anterior, anterolateral, anteromedial acromion and CA ligament release carried out as well.  The [facility] joint showed significant spurring  and degenerative changes and impingement and the distal 8-10 mm of clavicle was resected with a 6 mm burr.  After this was done, the shoulder could be brought through a full range of motion with no impingement on the rotator cuff.  At this point, we felt that all pathology had then satisfactorily addressed.  The instruments were removed.  Portals closed with 3-0 nylon suture.  Sterile dressings and a sling applied.  The patient awakened and taken to recovery room  in stable condition.  FOLLOWUP CARE:  Dakota Green will be followed as an outpatient on Valium with early physical therapy.  See me back in office in a week for sutures out and followup.     Dakota A. Thurston HoleWainer, M.D.     RAW/MEDQ  D:  05/14/2014  T:  05/14/2014  Job:  [id number]

## 2014-05-20 ENCOUNTER — Ambulatory Visit: Payer: Medicare Other | Attending: Orthopedic Surgery | Admitting: Physical Therapy

## 2014-05-20 DIAGNOSIS — M25519 Pain in unspecified shoulder: Secondary | ICD-10-CM | POA: Diagnosis not present

## 2014-05-20 DIAGNOSIS — Z9889 Other specified postprocedural states: Secondary | ICD-10-CM | POA: Insufficient documentation

## 2014-05-20 DIAGNOSIS — M25619 Stiffness of unspecified shoulder, not elsewhere classified: Secondary | ICD-10-CM | POA: Diagnosis not present

## 2014-05-20 DIAGNOSIS — IMO0001 Reserved for inherently not codable concepts without codable children: Secondary | ICD-10-CM | POA: Insufficient documentation

## 2014-05-20 DIAGNOSIS — I10 Essential (primary) hypertension: Secondary | ICD-10-CM | POA: Insufficient documentation

## 2014-05-20 DIAGNOSIS — R5381 Other malaise: Secondary | ICD-10-CM | POA: Insufficient documentation

## 2014-05-23 ENCOUNTER — Ambulatory Visit: Payer: Medicare Other | Admitting: Physical Therapy

## 2014-05-23 DIAGNOSIS — IMO0001 Reserved for inherently not codable concepts without codable children: Secondary | ICD-10-CM | POA: Diagnosis not present

## 2014-05-28 ENCOUNTER — Ambulatory Visit: Payer: Medicare Other | Admitting: Physical Therapy

## 2014-05-28 DIAGNOSIS — IMO0001 Reserved for inherently not codable concepts without codable children: Secondary | ICD-10-CM | POA: Diagnosis not present

## 2014-05-30 ENCOUNTER — Ambulatory Visit: Payer: Medicare Other | Attending: Orthopedic Surgery | Admitting: Physical Therapy

## 2014-05-30 DIAGNOSIS — M25619 Stiffness of unspecified shoulder, not elsewhere classified: Secondary | ICD-10-CM | POA: Diagnosis not present

## 2014-05-30 DIAGNOSIS — R5381 Other malaise: Secondary | ICD-10-CM | POA: Insufficient documentation

## 2014-05-30 DIAGNOSIS — IMO0001 Reserved for inherently not codable concepts without codable children: Secondary | ICD-10-CM | POA: Diagnosis present

## 2014-05-30 DIAGNOSIS — Z9889 Other specified postprocedural states: Secondary | ICD-10-CM | POA: Diagnosis not present

## 2014-05-30 DIAGNOSIS — M25519 Pain in unspecified shoulder: Secondary | ICD-10-CM | POA: Diagnosis not present

## 2014-05-30 DIAGNOSIS — I10 Essential (primary) hypertension: Secondary | ICD-10-CM | POA: Diagnosis not present

## 2014-06-03 ENCOUNTER — Ambulatory Visit: Payer: Medicare Other | Admitting: Physical Therapy

## 2014-06-03 DIAGNOSIS — IMO0001 Reserved for inherently not codable concepts without codable children: Secondary | ICD-10-CM | POA: Diagnosis not present

## 2014-06-06 ENCOUNTER — Telehealth: Payer: Self-pay | Admitting: Internal Medicine

## 2014-06-06 ENCOUNTER — Ambulatory Visit: Payer: Medicare Other | Admitting: Physical Therapy

## 2014-06-06 DIAGNOSIS — IMO0001 Reserved for inherently not codable concepts without codable children: Secondary | ICD-10-CM | POA: Diagnosis not present

## 2014-06-06 NOTE — Telephone Encounter (Signed)
[id number] - [place],  - [address] is requesting 90 day re-fill on celecoxib (CELEBREX) 200 MG capsule

## 2014-06-07 MED ORDER — CELECOXIB 200 MG PO CAPS: ORAL_CAPSULE | ORAL | Status: DC

## 2014-06-07 NOTE — Telephone Encounter (Signed)
Rx sent 

## 2014-06-10 ENCOUNTER — Ambulatory Visit: Payer: Medicare Other | Admitting: Physical Therapy

## 2014-06-10 DIAGNOSIS — IMO0001 Reserved for inherently not codable concepts without codable children: Secondary | ICD-10-CM | POA: Diagnosis not present

## 2014-06-12 ENCOUNTER — Other Ambulatory Visit: Payer: Self-pay | Admitting: Internal Medicine

## 2014-06-13 ENCOUNTER — Ambulatory Visit: Payer: Medicare Other | Admitting: Physical Therapy

## 2014-06-13 DIAGNOSIS — IMO0001 Reserved for inherently not codable concepts without codable children: Secondary | ICD-10-CM | POA: Diagnosis not present

## 2014-06-17 ENCOUNTER — Ambulatory Visit: Payer: Medicare Other | Admitting: Physical Therapy

## 2014-06-17 DIAGNOSIS — IMO0001 Reserved for inherently not codable concepts without codable children: Secondary | ICD-10-CM | POA: Diagnosis not present

## 2014-06-20 ENCOUNTER — Ambulatory Visit: Payer: Medicare Other | Admitting: Physical Therapy

## 2014-06-20 DIAGNOSIS — IMO0001 Reserved for inherently not codable concepts without codable children: Secondary | ICD-10-CM | POA: Diagnosis not present

## 2014-06-24 ENCOUNTER — Ambulatory Visit: Payer: Medicare Other | Admitting: Physical Therapy

## 2014-06-24 DIAGNOSIS — IMO0001 Reserved for inherently not codable concepts without codable children: Secondary | ICD-10-CM | POA: Diagnosis not present

## 2014-06-27 ENCOUNTER — Ambulatory Visit: Payer: Medicare Other | Admitting: Physical Therapy

## 2014-06-27 DIAGNOSIS — IMO0001 Reserved for inherently not codable concepts without codable children: Secondary | ICD-10-CM | POA: Diagnosis not present

## 2014-07-01 ENCOUNTER — Ambulatory Visit: Payer: Medicare Other | Admitting: Physical Therapy

## 2014-07-02 ENCOUNTER — Ambulatory Visit: Payer: Medicare Other | Attending: Orthopedic Surgery | Admitting: *Deleted

## 2014-07-02 DIAGNOSIS — R5381 Other malaise: Secondary | ICD-10-CM | POA: Insufficient documentation

## 2014-07-02 DIAGNOSIS — Z9889 Other specified postprocedural states: Secondary | ICD-10-CM | POA: Diagnosis not present

## 2014-07-02 DIAGNOSIS — M25619 Stiffness of unspecified shoulder, not elsewhere classified: Secondary | ICD-10-CM | POA: Diagnosis not present

## 2014-07-02 DIAGNOSIS — I10 Essential (primary) hypertension: Secondary | ICD-10-CM | POA: Diagnosis not present

## 2014-07-02 DIAGNOSIS — IMO0001 Reserved for inherently not codable concepts without codable children: Secondary | ICD-10-CM | POA: Insufficient documentation

## 2014-07-02 DIAGNOSIS — M25519 Pain in unspecified shoulder: Secondary | ICD-10-CM | POA: Insufficient documentation

## 2014-07-03 ENCOUNTER — Encounter: Payer: Self-pay | Admitting: Internal Medicine

## 2014-07-04 ENCOUNTER — Ambulatory Visit: Payer: Medicare Other | Admitting: Physical Therapy

## 2014-07-04 DIAGNOSIS — IMO0001 Reserved for inherently not codable concepts without codable children: Secondary | ICD-10-CM | POA: Diagnosis not present

## 2014-07-08 ENCOUNTER — Ambulatory Visit: Payer: Medicare Other | Admitting: Physical Therapy

## 2014-07-08 DIAGNOSIS — IMO0001 Reserved for inherently not codable concepts without codable children: Secondary | ICD-10-CM | POA: Diagnosis not present

## 2014-07-11 ENCOUNTER — Ambulatory Visit: Payer: Medicare Other | Admitting: Physical Therapy

## 2014-07-11 DIAGNOSIS — IMO0001 Reserved for inherently not codable concepts without codable children: Secondary | ICD-10-CM | POA: Diagnosis not present

## 2014-07-15 ENCOUNTER — Encounter: Payer: Medicare Other | Admitting: Physical Therapy

## 2014-07-18 ENCOUNTER — Ambulatory Visit: Payer: Medicare Other | Admitting: Physical Therapy

## 2014-07-18 DIAGNOSIS — IMO0001 Reserved for inherently not codable concepts without codable children: Secondary | ICD-10-CM | POA: Diagnosis not present

## 2014-07-22 ENCOUNTER — Encounter: Payer: Self-pay | Admitting: Internal Medicine

## 2014-07-22 ENCOUNTER — Ambulatory Visit (INDEPENDENT_AMBULATORY_CARE_PROVIDER_SITE_OTHER): Payer: Medicare Other | Admitting: Internal Medicine

## 2014-07-22 VITALS — BP 100/60 | HR 77 | Temp 98.0°F | Resp 20 | Ht 70.0 in | Wt 154.0 lb

## 2014-07-22 DIAGNOSIS — M75102 Unspecified rotator cuff tear or rupture of left shoulder, not specified as traumatic: Secondary | ICD-10-CM

## 2014-07-22 DIAGNOSIS — S43429A Sprain of unspecified rotator cuff capsule, initial encounter: Secondary | ICD-10-CM

## 2014-07-22 DIAGNOSIS — I2510 Atherosclerotic heart disease of native coronary artery without angina pectoris: Secondary | ICD-10-CM

## 2014-07-22 DIAGNOSIS — I10 Essential (primary) hypertension: Secondary | ICD-10-CM

## 2014-07-22 NOTE — Patient Instructions (Signed)
Limit your sodium (Salt) intake  Please check your blood pressure on a regular basis.  If it is consistently greater than 150/90, please make an office appointment.  Return in 6 months for follow-up   

## 2014-07-22 NOTE — Progress Notes (Signed)
Subjective:    Patient ID: Dakota Green, male    DOB: 06/26/1933, 80 y.o.   MRN: [id number]  HPI 80 year old patient History of hypertension, and advanced osteoarthritis.  He is recovering from recent left rotator cuff surgery.  In general doing fairly well, but still active with physical therapy following his left rotator cuff repair.  He has CAD which has been stable.  Past Medical History  Diagnosis Date  . ALLERGIC RHINITIS 05/04/2007  . CORONARY ARTERY DISEASE 05/04/2007  . DIVERTICULOSIS, COLON 08/14/2009  . ESOPHAGEAL STRICTURE 08/14/2009  . GERD 05/04/2007  . HIATAL HERNIA 08/14/2009  . HYPERLIPIDEMIA 05/04/2007  . HYPERTENSION 05/04/2007  . Osteoarth NOS-Unspec 05/04/2007  . PEPTIC ULCER DISEASE 05/04/2007    bleeding ulcer with hospitalization  . DJD (degenerative joint disease)   . Myocardial infarction 1987  . Torn rotator cuff     right    History   Social History  . Marital Status: Married    Spouse Name: N/A    Number of Children: N/A  . Years of Education: N/A   Occupational History  . Not on file.   Social History Main Topics  . Smoking status: Former Smoker    Quit date: 11/29/1985  . Smokeless tobacco: Never Used  . Alcohol Use: Yes     Comment: occ  . Drug Use: No  . Sexual Activity: Not on file   Other Topics Concern  . Not on file   Social History Narrative  . No narrative on file    Past Surgical History  Procedure Laterality Date  . Coronary artery bypass graft    . Cataract extraction    . Coronary angioplasty with stent placement    . Cardiac catheterization  06/12/2007    EF 45%  . Cardiac catheterization  01/18/2005    EF 50-55%  . Cardiac catheterization  01/14/2003    EF 55%  . US echocardiography  08/23/2005    EF 50-55%  . Cardiovascular stress test  02/07/2001  . Tonsillectomy    . Shoulder arthroscopy  2013    right  . Shoulder arthroscopy with rotator cuff repair and subacromial decompression Left 05/14/2014    Procedure: LEFT  SHOULDER ARTHROSCOPY WITH DEBRIDEMENT EXTENSIVE, DISAL CLAVICULECTOMY, SUBACROMIAL DECOMPRESSION PARTIAL ACROMIOPLASTY WITH CORACOACROMIAL RELEASE AND ROTATOR CUFF REPAIR;  Surgeon: Nilda Simmer, MD;  Location: [facility];  Service: Orthopedics;  Laterality: Left;    Family History  Problem Relation Age of Onset  . Heart attack Mother     Allergies  Allergen Reactions  . Crestor [Rosuvastatin Calcium]     Aches     Current Outpatient Prescriptions on File Prior to Visit  Medication Sig Dispense Refill  . ALPHAGAN P 0.1 % SOLN Place 1 drop into the right eye 2 (two) times daily.       Marland Kitchen aspirin 81 MG tablet Take 81 mg by mouth daily.        . benazepril (LOTENSIN) 10 MG tablet TAKE 1 TABLET DAILY  90 tablet  1  . celecoxib (CELEBREX) 200 MG capsule TAKE 1 CAPSULE BY MOUTH 2 TIMES A DAY  60 capsule  5  . clopidogrel (PLAVIX) 75 MG tablet TAKE 1 TABLET BY MOUTH DAILY  90 tablet  1  . diazepam (VALIUM) 2 MG tablet Take 1 tablet (2 mg total) by mouth every 8 (eight) hours as needed for muscle spasms.  30 tablet  0  . esomeprazole (NEXIUM) 40 MG capsule Take  1 capsule (40 mg total) by mouth daily before breakfast.  90 capsule  6  . fish oil-omega-3 fatty acids 1000 MG capsule Take 1,000 mg by mouth 3 (three) times daily.        Marland Kitchen loratadine (CLARITIN) 10 MG tablet Take 10 mg by mouth daily.        . Multiple Vitamins-Minerals (PRESERVISION/LUTEIN PO) Take by mouth daily.        . predniSONE (DELTASONE) 5 MG tablet Take 1 tablet (5 mg total) by mouth daily.  90 tablet  3  . predniSONE (DELTASONE) 5 MG tablet TAKE 1 TABLET BY MOUTH DAILY  90 tablet  1  . tamsulosin (FLOMAX) 0.4 MG CAPS capsule TAKE 1 CAPSULE (0.4 MG TOTAL) BY MOUTH DAILY.  90 capsule  3  . temazepam (RESTORIL) 30 MG capsule TAKE 1 CAPSULE AT BEDTIME  30 capsule  5   No current facility-administered medications on file prior to visit.    BP 100/60  Pulse 77  Temp(Src) 98 F (36.7 C) (Oral)  Resp 20  Ht  5\' 10"  (1.778 m)  Wt 154 lb (69.854 kg)  BMI 22.10 kg/m2  SpO2 96%     Review of Systems  Constitutional: Negative for fever, chills, appetite change and fatigue.  HENT: Negative for congestion, dental problem, ear pain, hearing loss, sore throat, tinnitus, trouble swallowing and voice change.   Eyes: Negative for pain, discharge and visual disturbance.  Respiratory: Negative for cough, chest tightness, wheezing and stridor.   Cardiovascular: Negative for chest pain, palpitations and leg swelling.  Gastrointestinal: Negative for nausea, vomiting, abdominal pain, diarrhea, constipation, blood in stool and abdominal distention.  Genitourinary: Negative for urgency, hematuria, flank pain, discharge, difficulty urinating and genital sores.  Musculoskeletal: Positive for arthralgias and back pain. Negative for gait problem, joint swelling, myalgias and neck stiffness.       Status post left rotator cuff repair with persistent postop pain and decreased range of motion  Skin: Negative for rash.  Neurological: Negative for dizziness, syncope, speech difficulty, weakness, numbness and headaches.  Hematological: Negative for adenopathy. Does not bruise/bleed easily.  Psychiatric/Behavioral: Negative for behavioral problems and dysphoric mood. The patient is not nervous/anxious.        Objective:   Physical Exam  Constitutional: He is oriented to person, place, and time. He appears well-developed.  HENT:  Head: Normocephalic.  Right Ear: External ear normal.  Left Ear: External ear normal.  Eyes: Conjunctivae and EOM are normal.  Neck: Normal range of motion.  Cardiovascular: Normal rate and normal heart sounds.   Dorsalis pedis pulses full  Pulmonary/Chest: Breath sounds normal.  Abdominal: Bowel sounds are normal.  Musculoskeletal: Normal range of motion. He exhibits no edema and no tenderness.  Neurological: He is alert and oriented to person, place, and time.  Psychiatric: He has a  normal mood and affect. His behavior is normal.           Assessment and plan   Hypertension  Blood pressure low normal today with occasional orthostatic dizziness.  Patient is on a low dose of benazepril.  We'll place on hold and continue close home blood pressure monitoring Status post left rotator cuff repair.  Continue active, PT and orthopedic followup  Return in 6 months for followup.

## 2014-07-22 NOTE — Progress Notes (Signed)
Pre visit review using our clinic review tool, if applicable. No additional management support is needed unless otherwise documented below in the visit note. 

## 2014-07-24 ENCOUNTER — Ambulatory Visit: Payer: Medicare Other | Admitting: Physical Therapy

## 2014-07-24 DIAGNOSIS — IMO0001 Reserved for inherently not codable concepts without codable children: Secondary | ICD-10-CM | POA: Diagnosis not present

## 2014-07-25 ENCOUNTER — Other Ambulatory Visit: Payer: Self-pay | Admitting: Internal Medicine

## 2014-07-26 ENCOUNTER — Ambulatory Visit: Payer: Medicare Other | Admitting: *Deleted

## 2014-07-26 DIAGNOSIS — IMO0001 Reserved for inherently not codable concepts without codable children: Secondary | ICD-10-CM | POA: Diagnosis not present

## 2014-07-31 ENCOUNTER — Ambulatory Visit: Payer: Medicare Other | Attending: Orthopedic Surgery | Admitting: *Deleted

## 2014-07-31 DIAGNOSIS — IMO0001 Reserved for inherently not codable concepts without codable children: Secondary | ICD-10-CM | POA: Diagnosis not present

## 2014-07-31 DIAGNOSIS — M25519 Pain in unspecified shoulder: Secondary | ICD-10-CM | POA: Insufficient documentation

## 2014-07-31 DIAGNOSIS — Z9889 Other specified postprocedural states: Secondary | ICD-10-CM | POA: Insufficient documentation

## 2014-07-31 DIAGNOSIS — M25619 Stiffness of unspecified shoulder, not elsewhere classified: Secondary | ICD-10-CM | POA: Diagnosis not present

## 2014-07-31 DIAGNOSIS — R5381 Other malaise: Secondary | ICD-10-CM | POA: Insufficient documentation

## 2014-07-31 DIAGNOSIS — I10 Essential (primary) hypertension: Secondary | ICD-10-CM | POA: Diagnosis not present

## 2014-08-01 ENCOUNTER — Ambulatory Visit: Payer: Medicare Other | Admitting: *Deleted

## 2014-08-01 DIAGNOSIS — IMO0001 Reserved for inherently not codable concepts without codable children: Secondary | ICD-10-CM | POA: Diagnosis not present

## 2014-08-08 ENCOUNTER — Encounter: Payer: Medicare Other | Admitting: Physical Therapy

## 2014-08-09 ENCOUNTER — Ambulatory Visit: Payer: Medicare Other | Admitting: *Deleted

## 2014-08-09 DIAGNOSIS — IMO0001 Reserved for inherently not codable concepts without codable children: Secondary | ICD-10-CM | POA: Diagnosis not present

## 2014-08-11 ENCOUNTER — Other Ambulatory Visit: Payer: Self-pay | Admitting: Internal Medicine

## 2014-10-11 ENCOUNTER — Encounter: Payer: Self-pay | Admitting: Cardiovascular Disease

## 2014-10-11 ENCOUNTER — Ambulatory Visit (INDEPENDENT_AMBULATORY_CARE_PROVIDER_SITE_OTHER): Payer: Medicare Other | Admitting: Cardiovascular Disease

## 2014-10-11 VITALS — BP 102/66 | HR 69 | Ht 70.0 in | Wt 158.0 lb

## 2014-10-11 DIAGNOSIS — I2510 Atherosclerotic heart disease of native coronary artery without angina pectoris: Secondary | ICD-10-CM

## 2014-10-11 DIAGNOSIS — E785 Hyperlipidemia, unspecified: Secondary | ICD-10-CM

## 2014-10-11 DIAGNOSIS — I25810 Atherosclerosis of coronary artery bypass graft(s) without angina pectoris: Secondary | ICD-10-CM

## 2014-10-11 DIAGNOSIS — I10 Essential (primary) hypertension: Secondary | ICD-10-CM

## 2014-10-11 MED ORDER — EZETIMIBE 10 MG PO TABS: 10.0000 mg | ORAL_TABLET | Freq: Every day | ORAL | Status: DC

## 2014-10-11 NOTE — Assessment & Plan Note (Signed)
Dakota Green has a history of hyperlipidemia. He does not tolerate statins. We'll try him on Zetia 10 mg a day. Recheck fasting labs in 3 months

## 2014-10-11 NOTE — Progress Notes (Signed)
Dakota Green Date of Birth  [birth year] [facility] [address]    [address] [place], [place]  [address] [phone]  Fax  [phone]   Problem List: 1. CAD, status post coronary artery bypass grafting in 1999,  status post PCI in 2008 2. Right shoulder surgery 3  Hyperlipidemia 4. Arthritis 5. hypertension  History of Present Illness:  Dakota Green is a 80 year old gentleman with a history of coronary artery disease. He has a history of hyperlipidemia. He is intolerant to all statin medications.  He is having lots of problems with arthritis.     [name] fell and injured his right arm about 4 months ago. He's recently had surgery to repair torn tendons in the shoulder. He's doing very well from a cardiac standpoint. He's not had any episodes of chest pain or shortness of breath.   He has not had any cardiac complications since that time.  Nov. 12, 2014:  [name] is doing well.  Staying busy .  Exercising regularly.  Working hard on his [place] in [employer].   [place]    He has been having lots of problems with osteoarthritis.  He is on prednisone and celebrex and is doing well.   He has tired multiple statins and has not found one that he could take.  His last cholesterol levels are elevated.   Nov. 13, 2015:  [name] is doing well.  He is going for an epidural injection next week. No CP or dyspnea.  Has some bruising on his arms - tried holding his ASA and plavix for several days - is back on them now.   He has lost some weight and his BP has been low. He has been off his Benazapril for several months and his blood pressure remains normal.    102/66 today.  Current Outpatient Prescriptions on File Prior to Visit  Medication Sig Dispense Refill  . ALPHAGAN P 0.1 % SOLN Place 1 drop into the right eye 2 (two) times daily.     Marland Kitchen aspirin 81 MG tablet Take 81 mg by mouth daily.      . benazepril (LOTENSIN) 10 MG tablet TAKE 1 TABLET DAILY 90 tablet 1  .  celecoxib (CELEBREX) 200 MG capsule TAKE 1 CAPSULE BY MOUTH 2 TIMES A DAY 60 capsule 5  . clopidogrel (PLAVIX) 75 MG tablet TAKE 1 TABLET BY MOUTH DAILY 90 tablet 1  . esomeprazole (NEXIUM) 40 MG capsule Take 1 capsule (40 mg total) by mouth daily before breakfast. 90 capsule 6  . fish oil-omega-3 fatty acids 1000 MG capsule Take 1,000 mg by mouth 3 (three) times daily.      Marland Kitchen loratadine (CLARITIN) 10 MG tablet Take 10 mg by mouth daily.      . Multiple Vitamins-Minerals (PRESERVISION/LUTEIN PO) Take by mouth daily.      . predniSONE (DELTASONE) 5 MG tablet Take 1 tablet (5 mg total) by mouth daily. 90 tablet 3  . tamsulosin (FLOMAX) 0.4 MG CAPS capsule TAKE 1 CAPSULE (0.4 MG TOTAL) BY MOUTH DAILY. (Patient taking differently: TAKE 2 CAPSULE (0.4 MG TOTAL) BY MOUTH DAILY.) 90 capsule 3  . temazepam (RESTORIL) 30 MG capsule TAKE 1 CAPSULE AT BEDTIME 30 capsule 2   No current facility-administered medications on file prior to visit.    Allergies  Allergen Reactions  . Crestor [Rosuvastatin Calcium]     Aches     Past Medical History  Diagnosis Date  . ALLERGIC RHINITIS  05/04/2007  . CORONARY ARTERY DISEASE 05/04/2007  . DIVERTICULOSIS, COLON 08/14/2009  . ESOPHAGEAL STRICTURE 08/14/2009  . GERD 05/04/2007  . HIATAL HERNIA 08/14/2009  . HYPERLIPIDEMIA 05/04/2007  . HYPERTENSION 05/04/2007  . Osteoarth NOS-Unspec 05/04/2007  . PEPTIC ULCER DISEASE 05/04/2007    bleeding ulcer with hospitalization  . DJD (degenerative joint disease)   . Myocardial infarction 1987  . Torn rotator cuff     right    Past Surgical History  Procedure Laterality Date  . Coronary artery bypass graft    . Cataract extraction    . Coronary angioplasty with stent placement    . Cardiac catheterization  06/12/2007    EF 45%  . Cardiac catheterization  01/18/2005    EF 50-55%  . Cardiac catheterization  01/14/2003    EF 55%  . [place] echocardiography  08/23/2005    EF 50-55%  . Cardiovascular stress test  02/07/2001  .  Tonsillectomy    . Shoulder arthroscopy  2013    right  . Shoulder arthroscopy with rotator cuff repair and subacromial decompression Left 05/14/2014    Procedure: LEFT SHOULDER ARTHROSCOPY WITH DEBRIDEMENT EXTENSIVE, DISAL CLAVICULECTOMY, SUBACROMIAL DECOMPRESSION PARTIAL ACROMIOPLASTY WITH CORACOACROMIAL RELEASE AND ROTATOR CUFF REPAIR;  Surgeon: Nilda Simmerobert A Wainer, MD;  Location: [facility];  Service: Orthopedics;  Laterality: Left;    History  Smoking status  . Former Smoker  . Quit date: 11/29/1985  Smokeless tobacco  . Never Used    History  Alcohol Use  . Yes    Comment: occ    Family History  Problem Relation Age of Onset  . Heart attack Mother     Reviw of Systems:  Reviewed in the HPI.  All other systems are negative.  Physical Exam: BP 102/66 mmHg  Pulse 69  Ht 5\' 10"  (1.778 m)  Wt 158 lb (71.668 kg)  BMI 22.67 kg/m2  The patient is alert and oriented x 3.  The mood and affect are normal.   Skin: warm and dry.  Color is normal.    HEENT:   the sclera are nonicteric.  The mucous membranes are moist.  The carotids are 2+ without bruits.  There is no thyromegaly.  There is no JVD.    Lungs: clear.  The chest wall is non tender.    Heart: regular rate with a normal S1 and S2.  There are no murmurs, gallops, or rubs. The PMI is not displaced.     Abdomen: good bowel sounds.  There is no guarding or rebound.  There is no hepatosplenomegaly or tenderness.  There are no masses.   Extremities:  no clubbing, cyanosis, or edema.  The legs are without rashes.  The distal pulses are intact.   Neuro:  Cranial nerves II - XII are intact.  Motor and sensory functions are intact.    The gait is normal.  ECG: 10/11/2014:  NSR at 69,  LAHB.    Assessment / Plan:

## 2014-10-11 NOTE — Assessment & Plan Note (Signed)
Dakota Green has been watching his diet very closely. He's been off the benazepril for quite some time and his blood pressure remains normal. At this point I do not think he needs the benazepril. We'll remove it from his list. I'll see him again in 6 months.

## 2014-10-11 NOTE — Patient Instructions (Addendum)
Your physician has recommended you make the following change in your medication:  START Zetia 10 mg once daily  Your physician recommends that you return for lab work in: 3 months to check cholesterol/liver/bmet  Your physician wants you to follow-up in: 1  Year with Dr. Elease Hashimoto.  You will receive a reminder letter in the mail two months in advance. If you don't receive a letter, please call our office to schedule the follow-up appointment.

## 2014-10-11 NOTE — Assessment & Plan Note (Signed)
Dakota Green is doing well. Is not having episodes of chest discomfort. Has a long history of CAD and has had bypass surgery. We'll continue with the same medications. Encouraged him to continue with aspirin and Plavix.

## 2014-10-14 ENCOUNTER — Other Ambulatory Visit: Payer: Self-pay | Admitting: Internal Medicine

## 2014-10-14 ENCOUNTER — Telehealth: Payer: Self-pay | Admitting: Internal Medicine

## 2014-10-14 MED ORDER — TAMSULOSIN HCL 0.4 MG PO CAPS: 0.8000 mg | ORAL_CAPSULE | Freq: Every day | ORAL | Status: DC

## 2014-10-14 NOTE — Telephone Encounter (Signed)
Pt's wife notified Rx sent to pharmacy. 

## 2014-10-14 NOTE — Telephone Encounter (Signed)
Pt was told to increase tamsulosin 0.4mg  to  twice a day. Pt is out. Pt needs new rx tamsulosin 0.4 mg #180 for 90 day supply [facility] [place]

## 2014-10-28 ENCOUNTER — Telehealth: Payer: Self-pay | Admitting: Internal Medicine

## 2014-10-28 DIAGNOSIS — R1084 Generalized abdominal pain: Secondary | ICD-10-CM

## 2014-10-28 DIAGNOSIS — K219 Gastro-esophageal reflux disease without esophagitis: Secondary | ICD-10-CM

## 2014-10-28 NOTE — Telephone Encounter (Signed)
Please schedule GI consult for consideration of upper endoscopy Please make patient aware

## 2014-10-28 NOTE — Telephone Encounter (Signed)
Pt states he had been taking generic nexium for acid reflux. Pt states for yrs he had no problems. But states for the past few months he has gotten worse.  Pt has now been taking 2 /day . But that doesn't work.  Pt states his he is having stomach pain along w/ the acid reflux and gas. Pt refused to see another provider. Asked if I would let dr Kirtland Bouchard know and ask for advise. Pt states he took the non generic for yrs, but it appears he did take generic. pls advise

## 2014-10-28 NOTE — Telephone Encounter (Signed)
Please see message and advise 

## 2014-10-29 MED ORDER — NEXIUM 40 MG PO CPDR: 40.0000 mg | DELAYED_RELEASE_CAPSULE | Freq: Every day | ORAL | Status: DC

## 2014-10-29 NOTE — Telephone Encounter (Signed)
Spoke to pt's wife Bosie Clos, told her to let pt know GI will be contacting them for an appointment. Bosie Clos said he just wanted medication Nexium changed to brand instead of generic to see if it works better. Told her I will send new Rx to pharmacy but still needs to see GI due to problem. Bosie Clos verbalized understanding and will let husband know. Order for referral to GI done.

## 2014-11-01 ENCOUNTER — Encounter: Payer: Self-pay | Admitting: Internal Medicine

## 2014-11-09 ENCOUNTER — Other Ambulatory Visit: Payer: Self-pay | Admitting: Internal Medicine

## 2014-11-19 ENCOUNTER — Encounter: Payer: Self-pay | Admitting: *Deleted

## 2014-11-25 ENCOUNTER — Other Ambulatory Visit: Payer: Self-pay | Admitting: Internal Medicine

## 2014-11-25 NOTE — Telephone Encounter (Signed)
ok 

## 2014-11-25 NOTE — Telephone Encounter (Signed)
Is this ok to refill?  

## 2014-11-29 ENCOUNTER — Other Ambulatory Visit: Payer: Self-pay | Admitting: Internal Medicine

## 2014-12-02 ENCOUNTER — Other Ambulatory Visit: Payer: Medicare Other

## 2014-12-31 ENCOUNTER — Ambulatory Visit (INDEPENDENT_AMBULATORY_CARE_PROVIDER_SITE_OTHER): Payer: Medicare Other | Admitting: Internal Medicine

## 2014-12-31 ENCOUNTER — Telehealth: Payer: Self-pay

## 2014-12-31 ENCOUNTER — Encounter: Payer: Self-pay | Admitting: Internal Medicine

## 2014-12-31 VITALS — BP 152/84 | HR 68 | Ht 70.0 in | Wt 159.0 lb

## 2014-12-31 DIAGNOSIS — R1310 Dysphagia, unspecified: Secondary | ICD-10-CM

## 2014-12-31 MED ORDER — NEXIUM 40 MG PO CPDR: 40.0000 mg | DELAYED_RELEASE_CAPSULE | Freq: Every day | ORAL | Status: DC

## 2014-12-31 MED ORDER — SUCRALFATE 1 GM/10ML PO SUSP: ORAL | Status: DC

## 2014-12-31 NOTE — Telephone Encounter (Signed)
[facility] [address] [place] [address]   Dear Kristeen Miss M.D.,    We have scheduled the above patient for an endoscopic procedure. Our records show that he is on anticoagulation therapy.   Please advise as to how long the patient may come off his therapy of Plavix prior to the EGD/ possible DIL procedure, which is scheduled for 01/15/15.  Please fax back/ or route the completed form to Kasy Iannacone [place], CMA at [phone].   Sincerely,    Lina Sar, M.D.

## 2014-12-31 NOTE — Patient Instructions (Addendum)
You have been scheduled for an endoscopy. Please follow written instructions given to you at your visit today.  Continue your Asprin per Dr. Juanda Chance. If you use inhalers (even only as needed), please bring them with you on the day of your procedure.  You will be contaced by our office prior to your procedure for directions on holding your Plavix.  If you do not hear from our office 1 week prior to your scheduled procedure, please call [phone] to discuss.    We have sent the following medications to your pharmacy for you to pick up at your convenience: Nexium, Carafate   I appreciate the opportunity to care for you. Dr Melburn Popper, Dr Amador Cunas

## 2014-12-31 NOTE — Telephone Encounter (Signed)
Dakota Green may hold his plavix for 7 days prior to procedure. I would prefer that he continue his aspirin

## 2014-12-31 NOTE — Progress Notes (Signed)
Dakota Green [birth year] [id number]  Note: This dictation was prepared with Dragon digital system. Any transcriptional errors that result from this procedure are unintentional.   History of Present Illness: This is a 81 year old white male who has had exacerbation of gastroesophageal reflux and upper abdominal discomfort for past 4 months. He describes  bloating. indigestion and reflux. He has regurgitation of the food at night. He has had occasional sharp epigastric pain, he was on Nexium 40 mg daily until recently when the insurance company switched to generic Nexium and his symptoms flared up at that point. He has been on long-term Celebrex 200 mg twice a day and prednisone 5 mg daily for osteoarthritis. He has a history of peptic ulcer disease and bleeding ulcer in 1980 he had multiple endoscopies by Dr.Orr first 20 1996 which included esophageal dilation. Subsequent the endoscopy with dilation was in 1997, 2007 and we have done last endoscopy in October 2010 and dilated stricture with a 16 and 17 mm dilators. He is up-to-date on colonoscopy. He had a last exam in October 2010 which showed moderately severe diverticulosis, hemorrhoids and small cecal polyp which was not removed because it could not be located. He would like to get back on Nexium. Patient has been on long-term Plavix 75 mg daily after coronary bypass graft in 1999 he has been followed by Dr.Nasher, last appointment November 2015. He held Plavix for spinal injection last month     Past Medical History  Diagnosis Date  . ALLERGIC RHINITIS 05/04/2007  . CORONARY ARTERY DISEASE 05/04/2007  . DIVERTICULOSIS, COLON 08/14/2009  . ESOPHAGEAL STRICTURE 08/14/2009  . GERD 05/04/2007  . HIATAL HERNIA 08/14/2009  . HYPERLIPIDEMIA 05/04/2007  . HYPERTENSION 05/04/2007  . Osteoarth NOS-Unspec 05/04/2007  . PEPTIC ULCER DISEASE 05/04/2007    bleeding ulcer with hospitalization  . DJD (degenerative joint disease)   . Myocardial infarction 1987  . Torn  rotator cuff     right  . Internal hemorrhoids     Past Surgical History  Procedure Laterality Date  . Coronary artery bypass graft    . Cataract extraction Bilateral   . Coronary angioplasty with stent placement    . Cardiac catheterization  06/12/2007    EF 45%  . Cardiac catheterization  01/18/2005    EF 50-55%  . Cardiac catheterization  01/14/2003    EF 55%  . US echocardiography  08/23/2005    EF 50-55%  . Cardiovascular stress test  02/07/2001  . Tonsillectomy    . Shoulder arthroscopy  2013    right  . Shoulder arthroscopy with rotator cuff repair and subacromial decompression Left 05/14/2014    Procedure: LEFT SHOULDER ARTHROSCOPY WITH DEBRIDEMENT EXTENSIVE, DISAL CLAVICULECTOMY, SUBACROMIAL DECOMPRESSION PARTIAL ACROMIOPLASTY WITH CORACOACROMIAL RELEASE AND ROTATOR CUFF REPAIR;  Surgeon: Nilda Simmer, MD;  Location: [facility];  Service: Orthopedics;  Laterality: Left;    Allergies  Allergen Reactions  . Crestor [Rosuvastatin Calcium]     Aches     Family history and social history have been reviewed.  Review of Systems: Occasional dysphagia. Heartburn. Abdominal discomfort and dyspepsia  The remainder of the 10 point ROS is negative except as outlined in the H&P  Physical Exam: General Appearance Well developed, in no distress Eyes  Non icteric  HEENT  Non traumatic, normocephalic  Mouth No lesion, tongue papillated, no cheilosis Neck Supple without adenopathy, thyroid not enlarged, no carotid bruits, no JVD Lungs Clear to auscultation bilaterally, post thoracotomy scar  COR Normal S1,  normal S2, regular rhythm, no murmur, quiet precordium, occasional premature beat  Abdomensoft nontender with normoactive bowel sounds. Liver edge at costal margin. No bruit  Rectal Soft Hemoccult negative stool  Extremities  No pedal edema Skin No lesions Neurological Alert and oriented x 3 Psychological Normal mood and affect  Assessment and Plan:    81 year old white male with the new onset of dyspepsia and upper GI symptoms related to change of the Nexium to generic esomeprazole. We will request the refill of the trade Nexium. In the meantime if his insurance denies he will add ranitidine 150 mg in the middle of the day and we will increase his generic Nexium to 40 mg twice a day. He will be scheduled for upper endoscopy with possible dilation. We will also clear with Dr. Melburn Popper holding Plavix for 5 days. We will also add Carafate slurry 10 cc twice a day. He is not willing to cut back on his Celebrex because of fall joint pains    Dakota Green @TODAY (<PARAMETER> error)@

## 2015-01-01 NOTE — Telephone Encounter (Signed)
Left message with patients wife to have patient call back. 

## 2015-01-02 NOTE — Telephone Encounter (Signed)
Patient advised of Dr Harvie Bridge recommendations. He verbalizes understanding.

## 2015-01-13 ENCOUNTER — Other Ambulatory Visit: Payer: Medicare Other

## 2015-01-15 ENCOUNTER — Encounter: Payer: Self-pay | Admitting: Internal Medicine

## 2015-01-15 ENCOUNTER — Ambulatory Visit (AMBULATORY_SURGERY_CENTER): Payer: Medicare Other | Admitting: Internal Medicine

## 2015-01-15 VITALS — BP 153/74 | HR 63 | Temp 97.5°F | Resp 16 | Ht 70.0 in | Wt 159.0 lb

## 2015-01-15 DIAGNOSIS — R1310 Dysphagia, unspecified: Secondary | ICD-10-CM

## 2015-01-15 DIAGNOSIS — K222 Esophageal obstruction: Secondary | ICD-10-CM

## 2015-01-15 MED ORDER — SODIUM CHLORIDE 0.9 % IV SOLN: 500.0000 mL | INTRAVENOUS | Status: DC

## 2015-01-15 NOTE — Patient Instructions (Addendum)

## 2015-01-15 NOTE — Op Note (Signed)
[facility] [address] [place], [address]   ENDOSCOPY PROCEDURE REPORT  PATIENT: Dakota Green, Dakota Green  MR#: [id number] BIRTHDATE: December 08, 1932 , 82  yrs. old GENDER: male ENDOSCOPIST: Hart Carwin, MD REFERRED BY:  Eleonore Chiquito, M.D. PROCEDURE DATE:  01/15/2015 PROCEDURE:  EGD w/ wire guided (savary) dilation ASA CLASS:     Class II INDICATIONS:  Solid food dysphagia.  History of bleeding peptic ulcer in 1980.  Esophageal stricture dilated in 1996 and again in October 2010. MEDICATIONS: Monitored anesthesia care and Propofol 150 mg IV TOPICAL ANESTHETIC: none  DESCRIPTION OF PROCEDURE: After the risks benefits and alternatives of the procedure were thoroughly explained, informed consent was obtained.  The LB [id number] endoscope was introduced through the mouth and advanced to the second portion of the duodenum , Without limitations.  The instrument was slowly withdrawn as the mucosa was fully examined.    Esophagus: proximal, and mid esophageal mucosa was normal. There was a mild benign-appearing distal esophageal stricture at the GE junction which allowed the endoscope  to traverses without resistance. The diameter of the stricture was approximately 14 mm. There were no erosions.Z line was regular  Stomach: there was a small 1-2 cm reducible hiatal hernia. Gastric folds were normal. The gastric antrum and pyloric outlet was unremarkable.  Duodenu retroflexion of the endoscope confirmed presence of esophageal stricturem[ .guidewire was then placed into the gastric antrum and separate dilators passed through the stricture starting with the 15, 16 and 17 mm dilators. There was no blood on the dilator. Patient tolerated procedure well         The scope was then withdrawn from the patient and the procedure completed.  COMPLICATIONS: There were no immediate complications.  ENDOSCOPIC IMPRESSION: 1. benign-appearing distal esophageal stricture  dilated with 15, 16 and 17 mm dilators 2. Small hiatal hernia  RECOMMENDATIONS: Anti-reflux regimen to be follow repeat dilation when necessary continue medications for acid reflux REPEAT EXAM: prn  eSigned:  Hart Carwin, MD 01/15/2015 4:19 PM    CC:  PATIENT NAME:  Dakota Green, Dakota Green MR#: [id number]

## 2015-01-15 NOTE — Progress Notes (Signed)
Called to room to assist during endoscopic procedure.  Patient ID and intended procedure confirmed with present staff. Received instructions for my participation in the procedure from the performing physician.  

## 2015-01-16 ENCOUNTER — Telehealth: Payer: Self-pay | Admitting: *Deleted

## 2015-01-16 NOTE — Telephone Encounter (Signed)
  Follow up Call-  Call back number 01/15/2015  Post procedure Call Back phone  # [phone]  Permission to leave phone message Yes     Patient questions:  Do you have a fever, pain , or abdominal swelling? No. Pain Score  0 *  Have you tolerated food without any problems? Yes.    Have you been able to return to your normal activities? Yes.    Do you have any questions about your discharge instructions: Diet   No. Medications  No. Follow up visit  No.  Do you have questions or concerns about your Care? No.  Actions: * If pain score is 4 or above: No action needed, pain <4.

## 2015-01-20 ENCOUNTER — Encounter: Payer: Self-pay | Admitting: Internal Medicine

## 2015-01-20 ENCOUNTER — Telehealth: Payer: Self-pay | Admitting: Internal Medicine

## 2015-01-20 ENCOUNTER — Encounter: Payer: Self-pay | Admitting: *Deleted

## 2015-01-20 ENCOUNTER — Ambulatory Visit (INDEPENDENT_AMBULATORY_CARE_PROVIDER_SITE_OTHER): Payer: Medicare Other | Admitting: Internal Medicine

## 2015-01-20 VITALS — BP 142/80 | HR 77 | Temp 98.1°F | Resp 20 | Ht 70.0 in | Wt 161.0 lb

## 2015-01-20 DIAGNOSIS — I10 Essential (primary) hypertension: Secondary | ICD-10-CM

## 2015-01-20 DIAGNOSIS — M150 Primary generalized (osteo)arthritis: Secondary | ICD-10-CM

## 2015-01-20 DIAGNOSIS — K279 Peptic ulcer, site unspecified, unspecified as acute or chronic, without hemorrhage or perforation: Secondary | ICD-10-CM

## 2015-01-20 DIAGNOSIS — M159 Polyosteoarthritis, unspecified: Secondary | ICD-10-CM

## 2015-01-20 DIAGNOSIS — Z008 Encounter for other general examination: Secondary | ICD-10-CM

## 2015-01-20 DIAGNOSIS — M48061 Spinal stenosis, lumbar region without neurogenic claudication: Secondary | ICD-10-CM

## 2015-01-20 DIAGNOSIS — M4806 Spinal stenosis, lumbar region: Secondary | ICD-10-CM

## 2015-01-20 DIAGNOSIS — K222 Esophageal obstruction: Secondary | ICD-10-CM

## 2015-01-20 MED ORDER — TEMAZEPAM 30 MG PO CAPS: ORAL_CAPSULE | ORAL | Status: DC

## 2015-01-20 NOTE — Progress Notes (Signed)
Pre visit review using our clinic review tool, if applicable. No additional management support is needed unless otherwise documented below in the visit note. 

## 2015-01-20 NOTE — Patient Instructions (Signed)
Limit your sodium (Salt) intake  Return in 6 months for follow-up  

## 2015-01-20 NOTE — Telephone Encounter (Signed)
Order for referral done. 

## 2015-01-20 NOTE — Telephone Encounter (Signed)
Dr. Adam Phenix, [phone], is the name of the podiatrist patient would like to see for getting his toenails cut.

## 2015-01-20 NOTE — Progress Notes (Signed)
Subjective:    Patient ID: Dakota Green, male    DOB: 1933-11-26, 81 y.o.   MRN: [id number]  HPI 81 year old patient who is seen today for his six-month follow-up.  He has history lumbar spinal stenosis and has had 2 recent epidurals.  He continues to have the hip and leg pain. Since his last visit here, he has been evaluated by GI.  He has a history of inflammatory osteoarthritis and has been maintained on low-dose prednisone and Celebrex.  He has remote history of peptic ulcer disease.  He has done well on Nexium. He has coronary artery disease and is followed by cardiology. He is requesting referral to podiatry  Past Medical History  Diagnosis Date  . ALLERGIC RHINITIS 05/04/2007  . CORONARY ARTERY DISEASE 05/04/2007  . DIVERTICULOSIS, COLON 08/14/2009  . ESOPHAGEAL STRICTURE 08/14/2009  . GERD 05/04/2007  . HIATAL HERNIA 08/14/2009  . HYPERLIPIDEMIA 05/04/2007  . HYPERTENSION 05/04/2007  . Osteoarth NOS-Unspec 05/04/2007  . PEPTIC ULCER DISEASE 05/04/2007    bleeding ulcer with hospitalization  . DJD (degenerative joint disease)   . Myocardial infarction 1987  . Torn rotator cuff     right  . Internal hemorrhoids     History   Social History  . Marital Status: Married    Spouse Name: N/A  . Number of Children: N/A  . Years of Education: N/A   Occupational History  . Not on file.   Social History Main Topics  . Smoking status: Former Smoker    Quit date: 11/29/1985  . Smokeless tobacco: Never Used  . Alcohol Use: Yes     Comment: occ  . Drug Use: No  . Sexual Activity: Not on file   Other Topics Concern  . Not on file   Social History Narrative    Past Surgical History  Procedure Laterality Date  . Coronary artery bypass graft    . Cataract extraction Bilateral   . Coronary angioplasty with stent placement    . Cardiac catheterization  06/12/2007    EF 45%  . Cardiac catheterization  01/18/2005    EF 50-55%  . Cardiac catheterization  01/14/2003    EF 55%  . US  echocardiography  08/23/2005    EF 50-55%  . Cardiovascular stress test  02/07/2001  . Tonsillectomy    . Shoulder arthroscopy  2013    right  . Shoulder arthroscopy with rotator cuff repair and subacromial decompression Left 05/14/2014    Procedure: LEFT SHOULDER ARTHROSCOPY WITH DEBRIDEMENT EXTENSIVE, DISAL CLAVICULECTOMY, SUBACROMIAL DECOMPRESSION PARTIAL ACROMIOPLASTY WITH CORACOACROMIAL RELEASE AND ROTATOR CUFF REPAIR;  Surgeon: Nilda Simmer, MD;  Location: [facility];  Service: Orthopedics;  Laterality: Left;    Family History  Problem Relation Age of Onset  . Heart attack Mother   . Heart attack Father   . Prostate cancer Brother   . Colon cancer Neg Hx     Allergies  Allergen Reactions  . Statins Other (See Comments)    Muscle aches  . Crestor [Rosuvastatin Calcium]     Aches     Current Outpatient Prescriptions on File Prior to Visit  Medication Sig Dispense Refill  . ALPHAGAN P 0.1 % SOLN Place 1 drop into the right eye 2 (two) times daily.     Marland Kitchen aspirin 81 MG tablet Take 81 mg by mouth daily.      . Calcium Citrate-Vitamin D3 1000-400 LIQD Take by mouth.    . calcium-vitamin D (OSCAL WITH D)  500-200 MG-UNIT per tablet Take 2 tablets by mouth daily with breakfast.    . celecoxib (CELEBREX) 200 MG capsule TAKE 1 CAPSULE BY MOUTH 2 TIMES A DAY 60 capsule 3  . clopidogrel (PLAVIX) 75 MG tablet TAKE 1 TABLET BY MOUTH DAILY 90 tablet 1  . ezetimibe (ZETIA) 10 MG tablet Take 1 tablet (10 mg total) by mouth daily. 90 tablet 3  . fish oil-omega-3 fatty acids 1000 MG capsule Take 1,000 mg by mouth 3 (three) times daily.      Marland Kitchen loratadine (CLARITIN) 10 MG tablet Take 10 mg by mouth daily.      . Multiple Vitamins-Minerals (PRESERVISION/LUTEIN PO) Take by mouth daily.      Marland Kitchen NEXIUM 40 MG capsule Take 1 capsule (40 mg total) by mouth daily before breakfast. 30 capsule 11  . predniSONE (DELTASONE) 5 MG tablet TAKE 1 TABLET BY MOUTH DAILY 90 tablet 1  . sucralfate  (CARAFATE) 1 GM/10ML suspension Take 2 teaspoons twice a day 420 mL 1  . tamsulosin (FLOMAX) 0.4 MG CAPS capsule TAKE 1 CAPSULE (0.4 MG TOTAL) BY MOUTH DAILY. (Patient taking differently: TAKE 1 CAPSULE (0.4 MG TOTAL) BY MOUTH twice daily) 90 capsule 3   No current facility-administered medications on file prior to visit.    BP 142/80 mmHg  Pulse 77  Temp(Src) 98.1 F (36.7 C) (Oral)  Resp 20  Ht 5\' 10"  (1.778 m)  Wt 161 lb (73.029 kg)  BMI 23.10 kg/m2  SpO2 98%     Review of Systems  Constitutional: Negative for fever, chills, appetite change and fatigue.  HENT: Negative for congestion, dental problem, ear pain, hearing loss, sore throat, tinnitus, trouble swallowing and voice change.   Eyes: Negative for pain, discharge and visual disturbance.  Respiratory: Negative for cough, chest tightness, wheezing and stridor.   Cardiovascular: Negative for chest pain, palpitations and leg swelling.  Gastrointestinal: Negative for nausea, vomiting, abdominal pain, diarrhea, constipation, blood in stool and abdominal distention.  Genitourinary: Negative for urgency, hematuria, flank pain, discharge, difficulty urinating and genital sores.  Musculoskeletal: Positive for back pain and arthralgias. Negative for myalgias, joint swelling, gait problem and neck stiffness.  Skin: Negative for rash.  Neurological: Negative for dizziness, syncope, speech difficulty, weakness, numbness and headaches.  Hematological: Negative for adenopathy. Does not bruise/bleed easily.  Psychiatric/Behavioral: Negative for behavioral problems and dysphoric mood. The patient is not nervous/anxious.        Objective:   Physical Exam  Constitutional: He is oriented to person, place, and time. He appears well-developed.  HENT:  Head: Normocephalic.  Right Ear: External ear normal.  Left Ear: External ear normal.  Eyes: Conjunctivae and EOM are normal.  Neck: Normal range of motion.  Cardiovascular: Normal rate.     Murmur heard. Grade 2/6 systolic murmur  Pulmonary/Chest: Breath sounds normal.  Abdominal: Bowel sounds are normal.  Musculoskeletal: Normal range of motion. He exhibits no edema or tenderness.  Neurological: He is alert and oriented to person, place, and time.  Psychiatric: He has a normal mood and affect. His behavior is normal.          Assessment & Plan:    Inflammatory osteoarthritis.  Stable on the present regimen of both Celebrex and prednisone.  Has failed the single agent treatment only GERD, history of peptic ulcer disease, esophageal stricture.  Will continue chronic PPI therapy Coronary artery disease, stable Hypertension, stable  CPX 6 months

## 2015-01-22 ENCOUNTER — Other Ambulatory Visit (INDEPENDENT_AMBULATORY_CARE_PROVIDER_SITE_OTHER): Payer: Medicare Other | Admitting: *Deleted

## 2015-01-22 DIAGNOSIS — E785 Hyperlipidemia, unspecified: Secondary | ICD-10-CM

## 2015-01-22 DIAGNOSIS — I25810 Atherosclerosis of coronary artery bypass graft(s) without angina pectoris: Secondary | ICD-10-CM

## 2015-01-22 LAB — LIPID PANEL
CHOL/HDL RATIO: 3
Cholesterol: 183 mg/dL (ref 0–200)
HDL: 62.5 mg/dL (ref >39.00)
LDL CALC: 107 mg/dL — AB (ref 0–99)
NONHDL: 120.5
Triglycerides: 70 mg/dL (ref 0.0–149.0)
VLDL: 14 mg/dL (ref 0.0–40.0)

## 2015-01-22 LAB — BASIC METABOLIC PANEL
BUN: 13 mg/dL (ref 6–23)
CO2: 34 meq/L — AB (ref 19–32)
Calcium: 9.8 mg/dL (ref 8.4–10.5)
Chloride: 104 mEq/L (ref 96–112)
Creatinine, Ser: 0.8 mg/dL (ref 0.40–1.50)
GFR: 98.83 mL/min (ref >60.00)
Glucose, Bld: 100 mg/dL — ABNORMAL HIGH (ref 70–99)
POTASSIUM: 3.8 meq/L (ref 3.5–5.1)
SODIUM: 141 meq/L (ref 135–145)

## 2015-01-22 LAB — HEPATIC FUNCTION PANEL
ALT: 14 U/L (ref 0–53)
AST: 20 U/L (ref 0–37)
Albumin: 3.9 g/dL (ref 3.5–5.2)
Alkaline Phosphatase: 38 U/L — ABNORMAL LOW (ref 39–117)
BILIRUBIN DIRECT: 0.1 mg/dL (ref 0.0–0.3)
BILIRUBIN TOTAL: 0.6 mg/dL (ref 0.2–1.2)
Total Protein: 6.6 g/dL (ref 6.0–8.3)

## 2015-02-05 ENCOUNTER — Other Ambulatory Visit: Payer: Self-pay | Admitting: Internal Medicine

## 2015-03-17 ENCOUNTER — Other Ambulatory Visit: Payer: Self-pay | Admitting: Dermatology

## 2015-04-10 ENCOUNTER — Other Ambulatory Visit: Payer: Self-pay | Admitting: Neurosurgery

## 2015-04-18 ENCOUNTER — Encounter: Payer: Self-pay | Admitting: Nurse Practitioner

## 2015-04-30 ENCOUNTER — Encounter (HOSPITAL_COMMUNITY): Payer: Self-pay

## 2015-04-30 ENCOUNTER — Encounter (HOSPITAL_COMMUNITY)
Admission: RE | Admit: 2015-04-30 | Discharge: 2015-04-30 | Disposition: A | Discharge status: Home / Self Care | Payer: Medicare Other | Source: Ambulatory Visit | Attending: Neurosurgery | Admitting: Neurosurgery

## 2015-04-30 ENCOUNTER — Encounter (HOSPITAL_COMMUNITY)
Admission: RE | Admit: 2015-04-30 | Discharge: 2015-04-30 | Disposition: A | Discharge status: Home / Self Care | Payer: Medicare Other | Source: Ambulatory Visit | Attending: Anesthesiology | Admitting: Anesthesiology

## 2015-04-30 ENCOUNTER — Other Ambulatory Visit (HOSPITAL_COMMUNITY): Payer: Medicare Other

## 2015-04-30 DIAGNOSIS — Z01818 Encounter for other preprocedural examination: Secondary | ICD-10-CM | POA: Diagnosis present

## 2015-04-30 DIAGNOSIS — M4316 Spondylolisthesis, lumbar region: Secondary | ICD-10-CM | POA: Diagnosis not present

## 2015-04-30 DIAGNOSIS — M4806 Spinal stenosis, lumbar region: Secondary | ICD-10-CM | POA: Insufficient documentation

## 2015-04-30 HISTORY — DX: Benign prostatic hyperplasia without lower urinary tract symptoms: N40.0

## 2015-04-30 LAB — CBC
HEMATOCRIT: 38.5 % — AB (ref 39.0–52.0)
Hemoglobin: 12 g/dL — ABNORMAL LOW (ref 13.0–17.0)
MCH: 27.8 pg (ref 26.0–34.0)
MCHC: 31.2 g/dL (ref 30.0–36.0)
MCV: 89.1 fL (ref 78.0–100.0)
PLATELETS: 245 10*3/uL (ref 150–400)
RBC: 4.32 MIL/uL (ref 4.22–5.81)
RDW: 15.1 % (ref 11.5–15.5)
WBC: 6.3 10*3/uL (ref 4.0–10.5)

## 2015-04-30 LAB — TYPE AND SCREEN
ABO/RH(D): A POS
Antibody Screen: NEGATIVE

## 2015-04-30 LAB — BASIC METABOLIC PANEL
Anion gap: 10 (ref 5–15)
BUN: 13 mg/dL (ref 6–20)
CALCIUM: 9.7 mg/dL (ref 8.9–10.3)
CHLORIDE: 99 mmol/L — AB (ref 101–111)
CO2: 29 mmol/L (ref 22–32)
Creatinine, Ser: 1.03 mg/dL (ref 0.61–1.24)
GFR calc Af Amer: >60 mL/min (ref >60)
Glucose, Bld: 110 mg/dL — ABNORMAL HIGH (ref 65–99)
Potassium: 4.2 mmol/L (ref 3.5–5.1)
SODIUM: 138 mmol/L (ref 135–145)

## 2015-04-30 LAB — ABO/RH: ABO/RH(D): A POS

## 2015-04-30 LAB — APTT: aPTT: 27 seconds (ref 24–37)

## 2015-04-30 LAB — SURGICAL PCR SCREEN
MRSA, PCR: NEGATIVE
Staphylococcus aureus: NEGATIVE

## 2015-04-30 LAB — PROTIME-INR
INR: 1.03 (ref 0.00–1.49)
PROTHROMBIN TIME: 13.7 s (ref 11.6–15.2)

## 2015-04-30 NOTE — Pre-Procedure Instructions (Signed)
    Dakota Green  04/30/2015      [id number] - [place], [place] - [address] [place] [address] Phone: [phone] Fax: [phone]              Your procedure is scheduled on Monday, June 6th   Report to [facility] Admitting at 5:30 A.M.  Call this number if you have problems the morning of surgery:  [phone]   Remember:  Do not eat food or drink liquids after midnight Sunday.  Take these medicines the morning of surgery with A SIP OF WATER : Nexium, Claritin, Flomax.  Please STOP taking any herbal medications or supplements, anti-inflammatories 4-5 days prior to surgery.   Do not wear jewelry - no rings or watches.  Do not wear lotions or colognes.  You may NOT wear deodorant the day of surgery.   Men may shave face and neck.   Do not bring valuables to the hospital.  [facility] is not responsible for any belongings or valuables.  Contacts, dentures or bridgework may not be worn into surgery.  Leave your suitcase in the car.  After surgery it may be brought to your room.  For patients admitted to the hospital, discharge time will be determined by your treatment team.            Name and phone number of your driver:               Special instructions:  "Preparing for Surgery" instruction sheet.            Please read over the following fact sheets that you were given. Pain Booklet, Blood Transfusion Information, MRSA Information and Surgical Site Infection Prevention

## 2015-04-30 NOTE — Progress Notes (Addendum)
Cardiac clearance note from Dr. Elease Hashimoto in chart.  Pt will hold plavix x 7 days as instructed.  Has significant heart hx. MI in 1987. Dr. Laneta Simmers did the CABG in 1999.  Pt has had several caths done with stent and angioplasty.  DA

## 2015-05-01 NOTE — Progress Notes (Signed)
Anesthesia Chart Review:  Pt is 81 year old male scheduled for L3-4 and L4-5 laminectomy, L4-5 PLIF on 05/05/2015 with Dr. Lovell Sheehan.   Cardiologist is Dr. Elease Hashimoto, last office visit 10/11/2014. PCP is Dr. Amador Cunas, last office visit 01/20/2015.   PMH includes: CAD (s/p CABG 1999, s/p stenting to RCA), HTN (hx of- controlled without meds), MI (1987), hyperlipidemia, GERD, hiatal hernia, PUD. Former smoker. BMI 22. S/p L shoulder arthroscopy 05/14/14 and s/p R shoulder arthroscopy 08/29/12.   Medications include: ASA, plavix. Pt to hold plavix for 7 days prior to surgery per Dr. Elease Hashimoto.   Preoperative labs reviewed.    Chest x-ray 04/30/2015 reviewed. No acute cardiopulmonary findings.   EKG 04/30/2015: Sinus rhythm with occasional PVCs. Left anterior fascicular block.   Cardiac cath 06/12/2007: -Severe native coronary artery disease with patentsaphenous vein grafts to the right coronary artery, to the obtuse marginal artery and a patent left internal mammary artery to the left anterior descending.  -distal RCA stent with moderate in-stent restenosis of about 40-50% -We will continue with medical therapy  Echo 08/23/2005: -LV systolic function is at lower limits of normal. There is mild LVH with impaired relaxation.  -mild mitral and mild tricuspid regurgitation -trivial aortic insufficiency.   Pt has cardiac clearance from Dr. Elease Hashimoto for procedure.   If no changes, I anticipate pt can proceed with surgery as scheduled.   Rica Mast, FNP-BC [facility]/Anesthesiology Phone: [phone] 05/01/2015 9:28 AM

## 2015-05-04 MED ORDER — CEFAZOLIN SODIUM-DEXTROSE 2-3 GM-% IV SOLR
2.0000 g | INTRAVENOUS | Status: AC
  Administered 2015-05-05: 2 g via INTRAVENOUS
  Filled 2015-05-04: qty 50

## 2015-05-05 ENCOUNTER — Encounter (HOSPITAL_COMMUNITY)
Admission: RE | Disposition: A | Discharge status: Home / Self Care | Payer: Self-pay | Source: Ambulatory Visit | Attending: Neurosurgery

## 2015-05-05 ENCOUNTER — Inpatient Hospital Stay (HOSPITAL_COMMUNITY): Payer: Medicare Other | Admitting: Emergency Medicine

## 2015-05-05 ENCOUNTER — Inpatient Hospital Stay (HOSPITAL_COMMUNITY): Payer: Medicare Other | Admitting: Anesthesiology

## 2015-05-05 ENCOUNTER — Encounter (HOSPITAL_COMMUNITY): Payer: Self-pay | Admitting: *Deleted

## 2015-05-05 ENCOUNTER — Inpatient Hospital Stay (HOSPITAL_COMMUNITY): Payer: Medicare Other

## 2015-05-05 ENCOUNTER — Inpatient Hospital Stay (HOSPITAL_COMMUNITY)
Admission: RE | Admit: 2015-05-05 | Discharge: 2015-05-07 | DRG: 460 | Disposition: A | Discharge status: Home / Self Care | Payer: Medicare Other | Source: Ambulatory Visit | Attending: Neurosurgery | Admitting: Neurosurgery

## 2015-05-05 DIAGNOSIS — K219 Gastro-esophageal reflux disease without esophagitis: Secondary | ICD-10-CM | POA: Diagnosis present

## 2015-05-05 DIAGNOSIS — Z79899 Other long term (current) drug therapy: Secondary | ICD-10-CM

## 2015-05-05 DIAGNOSIS — E785 Hyperlipidemia, unspecified: Secondary | ICD-10-CM | POA: Diagnosis present

## 2015-05-05 DIAGNOSIS — Z7952 Long term (current) use of systemic steroids: Secondary | ICD-10-CM

## 2015-05-05 DIAGNOSIS — I252 Old myocardial infarction: Secondary | ICD-10-CM

## 2015-05-05 DIAGNOSIS — Z888 Allergy status to other drugs, medicaments and biological substances status: Secondary | ICD-10-CM

## 2015-05-05 DIAGNOSIS — I2510 Atherosclerotic heart disease of native coronary artery without angina pectoris: Secondary | ICD-10-CM | POA: Diagnosis present

## 2015-05-05 DIAGNOSIS — N400 Enlarged prostate without lower urinary tract symptoms: Secondary | ICD-10-CM | POA: Diagnosis present

## 2015-05-05 DIAGNOSIS — Z419 Encounter for procedure for purposes other than remedying health state, unspecified: Secondary | ICD-10-CM

## 2015-05-05 DIAGNOSIS — I10 Essential (primary) hypertension: Secondary | ICD-10-CM | POA: Diagnosis present

## 2015-05-05 DIAGNOSIS — Z955 Presence of coronary angioplasty implant and graft: Secondary | ICD-10-CM

## 2015-05-05 DIAGNOSIS — Z951 Presence of aortocoronary bypass graft: Secondary | ICD-10-CM

## 2015-05-05 DIAGNOSIS — M4806 Spinal stenosis, lumbar region: Principal | ICD-10-CM | POA: Diagnosis present

## 2015-05-05 DIAGNOSIS — Z7982 Long term (current) use of aspirin: Secondary | ICD-10-CM

## 2015-05-05 DIAGNOSIS — Z7902 Long term (current) use of antithrombotics/antiplatelets: Secondary | ICD-10-CM

## 2015-05-05 DIAGNOSIS — Z87891 Personal history of nicotine dependence: Secondary | ICD-10-CM

## 2015-05-05 DIAGNOSIS — M4316 Spondylolisthesis, lumbar region: Secondary | ICD-10-CM | POA: Diagnosis present

## 2015-05-05 HISTORY — PX: BACK SURGERY: SHX140

## 2015-05-05 SURGERY — POSTERIOR LUMBAR FUSION 1 LEVEL
Anesthesia: General

## 2015-05-05 MED ORDER — OXYCODONE HCL 5 MG PO TABS: 5.0000 mg | ORAL_TABLET | Freq: Once | ORAL | Status: DC | PRN

## 2015-05-05 MED ORDER — DOCUSATE SODIUM 100 MG PO CAPS
100.0000 mg | ORAL_CAPSULE | Freq: Two times a day (BID) | ORAL | Status: DC
  Administered 2015-05-05 – 2015-05-07 (×4): 100 mg via ORAL
  Filled 2015-05-05 (×4): qty 1

## 2015-05-05 MED ORDER — EZETIMIBE 10 MG PO TABS
10.0000 mg | ORAL_TABLET | Freq: Every day | ORAL | Status: DC
  Administered 2015-05-06 – 2015-05-07 (×2): 10 mg via ORAL
  Filled 2015-05-05 (×3): qty 1

## 2015-05-05 MED ORDER — BUPIVACAINE LIPOSOME 1.3 % IJ SUSP
20.0000 mL | INTRAMUSCULAR | Status: AC
  Administered 2015-05-05: 20 mL
  Filled 2015-05-05: qty 20

## 2015-05-05 MED ORDER — HYDROMORPHONE HCL 1 MG/ML IJ SOLN
INTRAMUSCULAR | Status: AC
  Filled 2015-05-05: qty 1

## 2015-05-05 MED ORDER — FENTANYL CITRATE (PF) 250 MCG/5ML IJ SOLN
INTRAMUSCULAR | Status: AC
  Filled 2015-05-05: qty 5

## 2015-05-05 MED ORDER — BUPIVACAINE-EPINEPHRINE (PF) 0.5% -1:200000 IJ SOLN
INTRAMUSCULAR | Status: DC | PRN
  Administered 2015-05-05: 10 mL

## 2015-05-05 MED ORDER — VANCOMYCIN HCL 1000 MG IV SOLR
INTRAVENOUS | Status: DC | PRN
  Administered 2015-05-05: 1000 mg

## 2015-05-05 MED ORDER — FENTANYL CITRATE (PF) 100 MCG/2ML IJ SOLN
INTRAMUSCULAR | Status: DC | PRN
  Administered 2015-05-05: 100 ug via INTRAVENOUS
  Administered 2015-05-05 (×3): 50 ug via INTRAVENOUS

## 2015-05-05 MED ORDER — ACETAMINOPHEN 650 MG RE SUPP: 650.0000 mg | RECTAL | Status: DC | PRN

## 2015-05-05 MED ORDER — PROPOFOL 10 MG/ML IV BOLUS
INTRAVENOUS | Status: DC | PRN
  Administered 2015-05-05: 120 mg via INTRAVENOUS

## 2015-05-05 MED ORDER — DEXAMETHASONE SODIUM PHOSPHATE 4 MG/ML IJ SOLN
INTRAMUSCULAR | Status: AC
  Filled 2015-05-05: qty 2

## 2015-05-05 MED ORDER — GLYCOPYRROLATE 0.2 MG/ML IJ SOLN
INTRAMUSCULAR | Status: DC | PRN
  Administered 2015-05-05: 0.2 mg via INTRAVENOUS

## 2015-05-05 MED ORDER — PANTOPRAZOLE SODIUM 40 MG PO TBEC
80.0000 mg | DELAYED_RELEASE_TABLET | Freq: Every day | ORAL | Status: DC
Start: 2015-05-05 — End: 2015-05-07
  Administered 2015-05-06 – 2015-05-07 (×2): 80 mg via ORAL
  Filled 2015-05-05 (×3): qty 2

## 2015-05-05 MED ORDER — CEFAZOLIN SODIUM-DEXTROSE 2-3 GM-% IV SOLR
2.0000 g | Freq: Three times a day (TID) | INTRAVENOUS | Status: AC
  Administered 2015-05-05 (×2): 2 g via INTRAVENOUS
  Filled 2015-05-05 (×2): qty 50

## 2015-05-05 MED ORDER — HYDROMORPHONE HCL 2 MG PO TABS
1.0000 mg | ORAL_TABLET | ORAL | Status: DC | PRN
  Administered 2015-05-05 – 2015-05-06 (×4): 1 mg via ORAL
  Filled 2015-05-05 (×4): qty 1

## 2015-05-05 MED ORDER — BRIMONIDINE TARTRATE 0.15 % OP SOLN
1.0000 [drp] | Freq: Two times a day (BID) | OPHTHALMIC | Status: DC
  Administered 2015-05-05 – 2015-05-07 (×4): 1 [drp] via OPHTHALMIC
  Filled 2015-05-05 (×2): qty 5

## 2015-05-05 MED ORDER — ONDANSETRON HCL 4 MG/2ML IJ SOLN: 4.0000 mg | INTRAMUSCULAR | Status: DC | PRN

## 2015-05-05 MED ORDER — ACETAMINOPHEN 325 MG PO TABS: 650.0000 mg | ORAL_TABLET | ORAL | Status: DC | PRN

## 2015-05-05 MED ORDER — EPHEDRINE SULFATE 50 MG/ML IJ SOLN
INTRAMUSCULAR | Status: AC
  Filled 2015-05-05: qty 1

## 2015-05-05 MED ORDER — SUCRALFATE 1 GM/10ML PO SUSP
1.0000 g | Freq: Two times a day (BID) | ORAL | Status: DC
  Administered 2015-05-05 – 2015-05-07 (×4): 1 g via ORAL
  Filled 2015-05-05 (×4): qty 10

## 2015-05-05 MED ORDER — DEXAMETHASONE SODIUM PHOSPHATE 4 MG/ML IJ SOLN
INTRAMUSCULAR | Status: DC | PRN
  Administered 2015-05-05: 8 mg via INTRAVENOUS

## 2015-05-05 MED ORDER — ONDANSETRON HCL 4 MG/2ML IJ SOLN
INTRAMUSCULAR | Status: DC | PRN
  Administered 2015-05-05: 4 mg via INTRAVENOUS

## 2015-05-05 MED ORDER — ALUM & MAG HYDROXIDE-SIMETH 200-200-20 MG/5ML PO SUSP: 30.0000 mL | Freq: Four times a day (QID) | ORAL | Status: DC | PRN

## 2015-05-05 MED ORDER — VANCOMYCIN HCL 1000 MG IV SOLR
INTRAVENOUS | Status: AC
  Filled 2015-05-05: qty 1000

## 2015-05-05 MED ORDER — DIAZEPAM 5 MG PO TABS
5.0000 mg | ORAL_TABLET | Freq: Four times a day (QID) | ORAL | Status: DC | PRN
  Administered 2015-05-06: 5 mg via ORAL
  Filled 2015-05-05: qty 1

## 2015-05-05 MED ORDER — PROPOFOL 10 MG/ML IV BOLUS
INTRAVENOUS | Status: AC
  Filled 2015-05-05: qty 20

## 2015-05-05 MED ORDER — HYDROCORTISONE NA SUCCINATE PF 100 MG IJ SOLR
50.0000 mg | Freq: Three times a day (TID) | INTRAMUSCULAR | Status: AC
  Administered 2015-05-05 – 2015-05-07 (×6): 50 mg via INTRAVENOUS
  Filled 2015-05-05 (×6): qty 2

## 2015-05-05 MED ORDER — MORPHINE SULFATE 2 MG/ML IJ SOLN
1.0000 mg | INTRAMUSCULAR | Status: DC | PRN
  Administered 2015-05-05: 2 mg via INTRAVENOUS
  Filled 2015-05-05: qty 1

## 2015-05-05 MED ORDER — BISACODYL 10 MG RE SUPP: 10.0000 mg | Freq: Every day | RECTAL | Status: DC | PRN

## 2015-05-05 MED ORDER — OXYCODONE HCL 5 MG/5ML PO SOLN: 5.0000 mg | Freq: Once | ORAL | Status: DC | PRN

## 2015-05-05 MED ORDER — PHENYLEPHRINE HCL 10 MG/ML IJ SOLN
INTRAMUSCULAR | Status: DC | PRN
  Administered 2015-05-05: 80 ug via INTRAVENOUS
  Administered 2015-05-05: 120 ug via INTRAVENOUS
  Administered 2015-05-05: 80 ug via INTRAVENOUS

## 2015-05-05 MED ORDER — LIDOCAINE HCL (CARDIAC) 20 MG/ML IV SOLN
INTRAVENOUS | Status: DC | PRN
  Administered 2015-05-05: 40 mg via INTRAVENOUS

## 2015-05-05 MED ORDER — THROMBIN 20000 UNITS EX SOLR
CUTANEOUS | Status: DC | PRN
  Administered 2015-05-05: 09:00:00 via TOPICAL

## 2015-05-05 MED ORDER — HYDROMORPHONE HCL 1 MG/ML IJ SOLN
0.2500 mg | INTRAMUSCULAR | Status: DC | PRN
  Administered 2015-05-05 (×4): 0.5 mg via INTRAVENOUS

## 2015-05-05 MED ORDER — PHENOL 1.4 % MT LIQD: 1.0000 | OROMUCOSAL | Status: DC | PRN

## 2015-05-05 MED ORDER — LIDOCAINE HCL (CARDIAC) 20 MG/ML IV SOLN
INTRAVENOUS | Status: AC
  Filled 2015-05-05: qty 5

## 2015-05-05 MED ORDER — TEMAZEPAM 15 MG PO CAPS
30.0000 mg | ORAL_CAPSULE | Freq: Every evening | ORAL | Status: DC | PRN
  Administered 2015-05-05 – 2015-05-06 (×2): 30 mg via ORAL
  Filled 2015-05-05 (×2): qty 2

## 2015-05-05 MED ORDER — 0.9 % SODIUM CHLORIDE (POUR BTL) OPTIME
TOPICAL | Status: DC | PRN
  Administered 2015-05-05: 1000 mL

## 2015-05-05 MED ORDER — LORATADINE 10 MG PO TABS
10.0000 mg | ORAL_TABLET | Freq: Every day | ORAL | Status: DC
  Administered 2015-05-06 – 2015-05-07 (×2): 10 mg via ORAL
  Filled 2015-05-05 (×3): qty 1

## 2015-05-05 MED ORDER — ROCURONIUM BROMIDE 50 MG/5ML IV SOLN
INTRAVENOUS | Status: AC
  Filled 2015-05-05: qty 1

## 2015-05-05 MED ORDER — ONDANSETRON HCL 4 MG/2ML IJ SOLN: 4.0000 mg | Freq: Once | INTRAMUSCULAR | Status: DC | PRN

## 2015-05-05 MED ORDER — ROCURONIUM BROMIDE 100 MG/10ML IV SOLN
INTRAVENOUS | Status: DC | PRN
  Administered 2015-05-05: 10 mg via INTRAVENOUS
  Administered 2015-05-05: 40 mg via INTRAVENOUS

## 2015-05-05 MED ORDER — MENTHOL 3 MG MT LOZG: 1.0000 | LOZENGE | OROMUCOSAL | Status: DC | PRN

## 2015-05-05 MED ORDER — PREDNISONE 5 MG PO TABS
5.0000 mg | ORAL_TABLET | Freq: Every day | ORAL | Status: DC
  Administered 2015-05-06 – 2015-05-07 (×2): 5 mg via ORAL
  Filled 2015-05-05 (×3): qty 1

## 2015-05-05 MED ORDER — ONDANSETRON HCL 4 MG/2ML IJ SOLN
INTRAMUSCULAR | Status: AC
  Filled 2015-05-05: qty 2

## 2015-05-05 MED ORDER — NEOSTIGMINE METHYLSULFATE 10 MG/10ML IV SOLN
INTRAVENOUS | Status: DC | PRN
Start: 2015-05-05 — End: 2015-05-05
  Administered 2015-05-05: 2 mg via INTRAVENOUS

## 2015-05-05 MED ORDER — ARTIFICIAL TEARS OP OINT
TOPICAL_OINTMENT | OPHTHALMIC | Status: DC | PRN
  Administered 2015-05-05: 1 via OPHTHALMIC

## 2015-05-05 MED ORDER — TAMSULOSIN HCL 0.4 MG PO CAPS
0.4000 mg | ORAL_CAPSULE | Freq: Every day | ORAL | Status: DC
  Administered 2015-05-05 – 2015-05-06 (×2): 0.4 mg via ORAL
  Filled 2015-05-05 (×3): qty 1

## 2015-05-05 MED ORDER — LACTATED RINGERS IV SOLN
INTRAVENOUS | Status: DC | PRN
  Administered 2015-05-05 (×2): via INTRAVENOUS

## 2015-05-05 MED ORDER — SODIUM CHLORIDE 0.9 % IR SOLN
Status: DC | PRN
  Administered 2015-05-05 (×2)

## 2015-05-05 MED ORDER — EPHEDRINE SULFATE 50 MG/ML IJ SOLN
INTRAMUSCULAR | Status: DC | PRN
  Administered 2015-05-05: 5 mg via INTRAVENOUS
  Administered 2015-05-05: 10 mg via INTRAVENOUS
  Administered 2015-05-05: 5 mg via INTRAVENOUS

## 2015-05-05 MED ORDER — LACTATED RINGERS IV SOLN
INTRAVENOUS | Status: DC
  Administered 2015-05-05: 16:00:00 via INTRAVENOUS

## 2015-05-05 MED ORDER — STERILE WATER FOR INJECTION IJ SOLN
INTRAMUSCULAR | Status: AC
  Filled 2015-05-05: qty 10

## 2015-05-05 SURGICAL SUPPLY — 71 items
APL SKNCLS STERI-STRIP NONHPOA (GAUZE/BANDAGES/DRESSINGS) ×1
BAG DECANTER FOR FLEXI CONT (MISCELLANEOUS) ×5 IMPLANT
BENZOIN TINCTURE PRP APPL 2/3 (GAUZE/BANDAGES/DRESSINGS) ×3 IMPLANT
BLADE CLIPPER SURG (BLADE) ×2 IMPLANT
BRUSH SCRUB EZ PLAIN DRY (MISCELLANEOUS) ×3 IMPLANT
BUR MATCHSTICK NEURO 3.0 LAGG (BURR) ×3 IMPLANT
BUR PRECISION FLUTE 6.0 (BURR) ×3 IMPLANT
CAGE ALTERA 10X31X9-13 15D (Cage) ×1 IMPLANT
CAGE ALTERA 9-13-15-31MM (Cage) ×1 IMPLANT
CANISTER SUCT 3000ML PPV (MISCELLANEOUS) ×1 IMPLANT
CAP REVERE LOCKING (Cap) ×8 IMPLANT
CLOSURE WOUND 1/2 X4 (GAUZE/BANDAGES/DRESSINGS) ×1
CONT SPEC 4OZ CLIKSEAL STRL BL (MISCELLANEOUS) ×3 IMPLANT
COVER BACK TABLE 60X90IN (DRAPES) ×3 IMPLANT
DRAPE C-ARM 42X72 X-RAY (DRAPES) ×8 IMPLANT
DRAPE LAPAROTOMY 100X72X124 (DRAPES) ×3 IMPLANT
DRAPE POUCH INSTRU U-SHP 10X18 (DRAPES) ×3 IMPLANT
DRAPE PROXIMA HALF (DRAPES) ×7 IMPLANT
DRAPE SURG 17X23 STRL (DRAPES) ×12 IMPLANT
ELECT BLADE 4.0 EZ CLEAN MEGAD (MISCELLANEOUS) ×3
ELECT REM PT RETURN 9FT ADLT (ELECTROSURGICAL) ×3
ELECTRODE BLDE 4.0 EZ CLN MEGD (MISCELLANEOUS) ×1 IMPLANT
ELECTRODE REM PT RTRN 9FT ADLT (ELECTROSURGICAL) ×1 IMPLANT
EVACUATOR 1/8 PVC DRAIN (DRAIN) IMPLANT
GAUZE SPONGE 4X4 12PLY STRL (GAUZE/BANDAGES/DRESSINGS) ×3 IMPLANT
GAUZE SPONGE 4X4 16PLY XRAY LF (GAUZE/BANDAGES/DRESSINGS) ×1 IMPLANT
GLOVE BIO SURGEON STRL SZ7 (GLOVE) ×12 IMPLANT
GLOVE BIO SURGEON STRL SZ8 (GLOVE) ×6 IMPLANT
GLOVE BIO SURGEON STRL SZ8.5 (GLOVE) ×6 IMPLANT
GLOVE BIOGEL PI IND STRL 7.0 (GLOVE) IMPLANT
GLOVE BIOGEL PI IND STRL 7.5 (GLOVE) IMPLANT
GLOVE BIOGEL PI INDICATOR 7.0 (GLOVE) ×2
GLOVE BIOGEL PI INDICATOR 7.5 (GLOVE) ×6
GLOVE EXAM NITRILE LRG STRL (GLOVE) IMPLANT
GLOVE EXAM NITRILE MD LF STRL (GLOVE) IMPLANT
GLOVE EXAM NITRILE XL STR (GLOVE) IMPLANT
GLOVE EXAM NITRILE XS STR PU (GLOVE) IMPLANT
GOWN STRL REUS W/ TWL LRG LVL3 (GOWN DISPOSABLE) IMPLANT
GOWN STRL REUS W/ TWL XL LVL3 (GOWN DISPOSABLE) ×2 IMPLANT
GOWN STRL REUS W/TWL 2XL LVL3 (GOWN DISPOSABLE) IMPLANT
GOWN STRL REUS W/TWL LRG LVL3 (GOWN DISPOSABLE) ×9
GOWN STRL REUS W/TWL XL LVL3 (GOWN DISPOSABLE) ×9
KIT BASIN OR (CUSTOM PROCEDURE TRAY) ×3 IMPLANT
KIT ROOM TURNOVER OR (KITS) ×3 IMPLANT
MILL MEDIUM DISP (BLADE) ×2 IMPLANT
NDL HYPO 21X1.5 SAFETY (NEEDLE) IMPLANT
NEEDLE HYPO 21X1.5 SAFETY (NEEDLE) ×3 IMPLANT
NEEDLE HYPO 22GX1.5 SAFETY (NEEDLE) ×3 IMPLANT
NS IRRIG 1000ML POUR BTL (IV SOLUTION) ×3 IMPLANT
PACK LAMINECTOMY NEURO (CUSTOM PROCEDURE TRAY) ×3 IMPLANT
PAD ARMBOARD 7.5X6 YLW CONV (MISCELLANEOUS) ×11 IMPLANT
PATTIES SURGICAL .5 X1 (DISPOSABLE) IMPLANT
ROD REVERE 6.35 40MM (Rod) ×4 IMPLANT
SCREW 7.5X50MM (Screw) ×4 IMPLANT
SCREW REVERE 6.35 75X55MM (Screw) ×4 IMPLANT
SPONGE LAP 4X18 X RAY DECT (DISPOSABLE) IMPLANT
SPONGE NEURO XRAY DETECT 1X3 (DISPOSABLE) IMPLANT
SPONGE SURGIFOAM ABS GEL 100 (HEMOSTASIS) ×3 IMPLANT
STRIP BIOACTIVE 10CC 25X50X8 (Miscellaneous) ×4 IMPLANT
STRIP CLOSURE SKIN 1/2X4 (GAUZE/BANDAGES/DRESSINGS) ×2 IMPLANT
SUT VIC AB 1 CT1 18XBRD ANBCTR (SUTURE) ×2 IMPLANT
SUT VIC AB 1 CT1 8-18 (SUTURE) ×6
SUT VIC AB 2-0 CP2 18 (SUTURE) ×6 IMPLANT
SYR 20CC LL (SYRINGE) IMPLANT
SYR 20ML ECCENTRIC (SYRINGE) ×3 IMPLANT
TAPE CLOTH SURG 4X10 WHT LF (GAUZE/BANDAGES/DRESSINGS) ×2 IMPLANT
TAPE STRIPS DRAPE STRL (GAUZE/BANDAGES/DRESSINGS) ×2 IMPLANT
TOWEL OR 17X24 6PK STRL BLUE (TOWEL DISPOSABLE) ×3 IMPLANT
TOWEL OR 17X26 10 PK STRL BLUE (TOWEL DISPOSABLE) ×3 IMPLANT
TRAY FOLEY W/METER SILVER 14FR (SET/KITS/TRAYS/PACK) ×3 IMPLANT
WATER STERILE IRR 1000ML POUR (IV SOLUTION) ×3 IMPLANT

## 2015-05-05 NOTE — Progress Notes (Signed)
Report given to ronda rn as caregiver 

## 2015-05-05 NOTE — Op Note (Signed)
Brief history: The patient is an 81 year old white male who has complained of back and leg pain consistent with neurogenic claudication. He has failed medical management and was worked up with a lumbar MRI which demonstrated spinal stenosis most prominent at L2-3, L3-4 and L4-5. He had a spondylolisthesis at L4-5 as well. I discussed the various treatment options with the patient including surgery. He has weighed the risks, benefits, and alternative surgery and decided proceed with a lumbar decompression, instrumentation, and fusion.  Preoperative diagnosis: L4-5 spondylolisthesis, L2-3, L3-4 and L4-5 Degenerative disc disease, spinal stenosis compressing both the L3, L4 and L5  nerve roots; lumbago; lumbar radiculopathy; neurogenic claudication  Postoperative diagnosis: The same  Procedure: L3 and L4 laminectomy with bilateral L2 Laminotomy/foraminotomies to decompress the bilateral L3, L4 and L5 nerve roots(the work required to do this was in addition to the work required to do the posterior lumbar interbody fusion because of the patient's spinal stenosis, facet arthropathy. Etc. requiring a wide decompression of the nerve roots.); L4-5 posterior lumbar interbody fusion with local morselized autograft bone and Kinnex graft extender; insertion of interbody prosthesis at L4-5 (globus peek expandable interbody prosthesis); posterior nonsegmental instrumentation from L4 to L5 with globus titanium pedicle screws and rods; posterior lateral arthrodesis at L4-5 with local morselized autograft bone and Kinnex bone graft extender.  Surgeon: Dr. Delma Officer  Asst.: Dr. Barnett Abu  Anesthesia: Gen. endotracheal  Estimated blood loss: 300 mL  Drains: One medium Hemovac  Complications: None  Description of procedure: The patient was brought to the operating room by the anesthesia team. General endotracheal anesthesia was induced. The patient was turned to the prone position on the Wilson frame. The  patient's lumbosacral region was then prepared with Betadine scrub and Betadine solution. Sterile drapes were applied.  I then injected the area to be incised with Marcaine with epinephrine solution. I then used the scalpel to make a linear midline incision over the L2-3, L3-4 and L4-5 interspace. I then used electrocautery to perform a bilateral subperiosteal dissection exposing the spinous process and lamina of 2, L3, L4 and L5. We then obtained intraoperative radiograph to confirm our location. We then inserted the Verstrac retractor to provide exposure. I began the decompression by incising the interspinous ligament at L2-3, L3-4 and L4-5. I used Leksell Rogeur to  remove the spinous process of L3 and L4.  I began the decompression by using the high speed drill to perform laminotomies at L2, L3 and L4 bilaterally. We then used the Kerrison punches to complete the laminectomy at L3 and L4 and to widen the bilateral laminotomies at L2 and removed the ligamentum flavum at L2-3, L3-4 and L4-5. We used the Kerrison punches to remove the medial facets at L4-5. We performed wide foraminotomies about the bilateral L3, L4 and L5 nerve roots completing the decompression.  We now turned our attention to the posterior lumbar interbody fusion. I used a scalpel to incise the intervertebral disc at L4-5. I then performed a partial intervertebral discectomy at L4-5 bilaterally using the pituitary forceps. We prepared the vertebral endplates at L4-5 bilaterally for the fusion by removing the soft tissues with the curettes. We then used the trial spacers to pick the appropriate sized interbody prosthesis. We prefilled his prosthesis with a combination of local morselized autograft bone that we obtained during the decompression as well as Kinnex bone graft extender. We inserted the prefilled prosthesis into the interspace at L4-5. We then expanded the prosthesis.. There was a good snug  fit of the prosthesis in the  interspace. We then filled and the remainder of the intervertebral disc space with local morselized autograft bone and Kinnex. This completed the posterior lumbar interbody arthrodesis.  We now turned attention to the instrumentation. Under fluoroscopic guidance we cannulated the bilateral L4 and L5 pedicles with the bone probe. We then removed the bone probe. We then tapped the pedicle with a 6.5 millimeter tap. We then removed the tap. We probed inside the tapped pedicle with a ball probe to rule out cortical breaches. We then inserted a 7.5 x 50 and 55 millimeter pedicle screw into the L4 and L5 pedicles bilaterally under fluoroscopic guidance. We then palpated along the medial aspect of the pedicles to rule out cortical breaches. There were none. The nerve roots were not injured. We then connected the unilateral pedicle screws with a lordotic rod. We compressed the construct and secured the rod in place with the caps. We then tightened the caps appropriately. This completed the instrumentation from L4-5.  We now turned our attention to the posterior lateral arthrodesis at L4-5. We used the high-speed drill to decorticate the remainder of the facets, pars, transverse process at L4-5. We then applied a combination of local morselized autograft bone and Kinnex bone graft extender over these decorticated posterior lateral structures. This completed the posterior lateral arthrodesis.  We then obtained hemostasis using bipolar electrocautery. We irrigated the wound out with bacitracin solution. We inspected the thecal sac and nerve roots and noted they were well decompressed. We then removed the retractor. We placed a medium Hemovac drain in the epidural space and tunneled out through separate stab wound. We reapproximated patient's thoracolumbar fascia with interrupted #1 Vicryl suture. We reapproximated patient's subcutaneous tissue with interrupted 2-0 Vicryl suture. The reapproximated patient's skin with  Steri-Strips and benzoin. The wound was then coated with bacitracin ointment. A sterile dressing was applied. The drapes were removed. The patient was subsequently returned to the supine position where they were extubated by the anesthesia team. He was then transported to the post anesthesia care unit in stable condition. All sponge instrument and needle counts were reportedly correct at the end of this case.

## 2015-05-05 NOTE — Progress Notes (Signed)
Patient ID: Dakota Green, male   DOB: 1933/09/23, 81 y.o.   MRN: [id number] Subjective:  The patient is alert and pleasant. He looks well. He is in no apparent distress.  Objective: Vital signs in last 24 hours: Temp:  [97.7 F (36.5 C)-98.6 F (37 C)] 97.8 F (36.6 C) (06/06 1737) Pulse Rate:  [61-99] 99 (06/06 1737) Resp:  [10-20] 20 (06/06 1737) BP: (114-142)/(60-74) 131/67 mmHg (06/06 1737) SpO2:  [95 %-99 %] 99 % (06/06 1737) Weight:  [69.4 kg (153 lb)] 69.4 kg (153 lb) (06/06 0612)  Intake/Output from previous day:   Intake/Output this shift: Total I/O In: 1960 [P.O.:360; I.V.:1600] Out: 1640 [Urine:1280; Drains:10; Blood:350]  Physical exam the patient is alert and pleasant. He is moving his lower extremities well.  Lab Results: No results for input(s): WBC, HGB, HCT, PLT in the last 72 hours. BMET No results for input(s): NA, K, CL, CO2, GLUCOSE, BUN, CREATININE, CALCIUM in the last 72 hours.  Studies/Results: Dg Lumbar Spine 2-3 Views  05/05/2015   CLINICAL DATA:  L4-5 posterior fusion  EXAM: DG C-ARM 61-120 MIN; LUMBAR SPINE - 2-3 VIEW  COMPARISON:  04/08/2015  FLUOROSCOPY TIME:  Radiation Exposure Index (as provided by the fluoroscopic device):  If the device does not provide the exposure index:  Fluoroscopy Time:  18 seconds  Number of Acquired Images:  0  FINDINGS: Two intraoperative spot images demonstrate posterior fusion changes at L4-5. No hardware or bony complicating feature.  IMPRESSION: Posterior fusion at L4-5.   Electronically Signed   By: Charlett Nose M.D.   On: 05/05/2015 11:50   Dg Lumbar Spine 2-3 Views  05/05/2015   CLINICAL DATA:  L3-4, L4-5 laminectomy.  L4-5 posterior fusion.  EXAM: LUMBAR SPINE - 2-3 VIEW  COMPARISON:  04/08/2015  FINDINGS: First lateral intraoperative image demonstrates posterior localizing instrument at the L4-5 level  Second lateral intraoperative image demonstrates posterior surgical instrument at the L2-3 level.  Degenerative disc  disease at L5-S1 with disc space narrowing and vacuum disc. Degenerative facet disease in the mid and lower lumbar spine. Slight anterolisthesis of L4 on L5, stable.  IMPRESSION: Intraoperative localization as above.   Electronically Signed   By: Charlett Nose M.D.   On: 05/05/2015 11:48   Dg C-arm 1-60 Min  05/05/2015   CLINICAL DATA:  L4-5 posterior fusion  EXAM: DG C-ARM 61-120 MIN; LUMBAR SPINE - 2-3 VIEW  COMPARISON:  04/08/2015  FLUOROSCOPY TIME:  Radiation Exposure Index (as provided by the fluoroscopic device):  If the device does not provide the exposure index:  Fluoroscopy Time:  18 seconds  Number of Acquired Images:  0  FINDINGS: Two intraoperative spot images demonstrate posterior fusion changes at L4-5. No hardware or bony complicating feature.  IMPRESSION: Posterior fusion at L4-5.   Electronically Signed   By: Charlett Nose M.D.   On: 05/05/2015 11:50    Assessment/Plan: The patient is doing well.  LOS: 0 days     Dakota Green D 05/05/2015, 6:10 PM

## 2015-05-05 NOTE — Anesthesia Preprocedure Evaluation (Signed)
Anesthesia Evaluation  Patient identified by MRN, date of birth, ID band Patient awake    Reviewed: Allergy & Precautions, NPO status , Patient's Chart, lab work & pertinent test results  Airway Mallampati: II  TM Distance: >3 FB Neck ROM: Full    Dental  (+) Teeth Intact, Dental Advisory Given   Pulmonary former smoker,  breath sounds clear to auscultation        Cardiovascular hypertension, Rhythm:Regular Rate:Normal     Neuro/Psych    GI/Hepatic   Endo/Other    Renal/GU      Musculoskeletal   Abdominal   Peds  Hematology   Anesthesia Other Findings   Reproductive/Obstetrics                             Anesthesia Physical Anesthesia Plan  ASA: III  Anesthesia Plan: General   Post-op Pain Management:    Induction: Intravenous  Airway Management Planned: Mask  Additional Equipment:   Intra-op Plan:   Post-operative Plan: Extubation in OR  Informed Consent: I have reviewed the patients History and Physical, chart, labs and discussed the procedure including the risks, benefits and alternatives for the proposed anesthesia with the patient or authorized representative who has indicated his/her understanding and acceptance.   Dental advisory given  Plan Discussed with: CRNA and Anesthesiologist  Anesthesia Plan Comments: (Lumbar Spondylosis CAD S/P CABG 1999 clinically stable off plavix 7 days GERD with h/o esophageal stricture  Plan GA with oral ETT  Dakota Green)        Anesthesia Quick Evaluation

## 2015-05-05 NOTE — Transfer of Care (Signed)
Immediate Anesthesia Transfer of Care Note  Patient: Dakota Green  Procedure(s) Performed: Procedure(s) with comments: Lumbar three- four and Lumbar four- five Laminectomies with Lumbar four- five, Posterior Lumbar Interbody Fusion with Interbody Prosthesis, Posterior Lateral Arthrodesis and Posterior Segmental Instrumentation (N/A) - L34 and L45 laminectomies with L45 posterior lumbar interbody fusion with interbody prosthesis posterior lateral arthrodesis and posterior nonsegmental instrumentation  Patient Location: PACU  Anesthesia Type:General  Level of Consciousness: awake, alert , oriented and patient cooperative  Airway & Oxygen Therapy: Patient Spontanous Breathing and Patient connected to nasal cannula oxygen  Post-op Assessment: Report given to RN and Post -op Vital signs reviewed and stable  Post vital signs: Reviewed and stable  Last Vitals:  Filed Vitals:   05/05/15 0612  BP: 142/71  Pulse: 61  Temp: 36.5 C  Resp: 20    Complications: No apparent anesthesia complications

## 2015-05-05 NOTE — Progress Notes (Signed)
Pt wife request she speak with Anesthesia before he goes back informed Donnie of wife's request. Note also placed on chart that she does not want him to have versed.

## 2015-05-05 NOTE — Progress Notes (Signed)
Subjective:  The patient is alert and pleasant. He looks well. He is in no apparent distress.  Objective: Vital signs in last 24 hours: Temp:  [97.7 F (36.5 C)-98.6 F (37 C)] 98.6 F (37 C) (06/06 1215) Pulse Rate:  [61-76] 76 (06/06 1215) Resp:  [18-20] 18 (06/06 1215) BP: (126-142)/(60-71) 126/60 mmHg (06/06 1215) SpO2:  [97 %-98 %] 98 % (06/06 1215) Weight:  [69.4 kg (153 lb)] 69.4 kg (153 lb) (06/06 0612)  Intake/Output from previous day:   Intake/Output this shift: Total I/O In: 1600 [I.V.:1600] Out: 1080 [Urine:730; Blood:350]  Physical exam the patient is alert and pleasant. He is moving his lower extremity as well.  Lab Results: No results for input(s): WBC, HGB, HCT, PLT in the last 72 hours. BMET No results for input(s): NA, K, CL, CO2, GLUCOSE, BUN, CREATININE, CALCIUM in the last 72 hours.  Studies/Results: Dg Lumbar Spine 2-3 Views  05/05/2015   CLINICAL DATA:  L4-5 posterior fusion  EXAM: DG C-ARM 61-120 MIN; LUMBAR SPINE - 2-3 VIEW  COMPARISON:  04/08/2015  FLUOROSCOPY TIME:  Radiation Exposure Index (as provided by the fluoroscopic device):  If the device does not provide the exposure index:  Fluoroscopy Time:  18 seconds  Number of Acquired Images:  0  FINDINGS: Two intraoperative spot images demonstrate posterior fusion changes at L4-5. No hardware or bony complicating feature.  IMPRESSION: Posterior fusion at L4-5.   Electronically Signed   By: Charlett Nose M.D.   On: 05/05/2015 11:50   Dg Lumbar Spine 2-3 Views  05/05/2015   CLINICAL DATA:  L3-4, L4-5 laminectomy.  L4-5 posterior fusion.  EXAM: LUMBAR SPINE - 2-3 VIEW  COMPARISON:  04/08/2015  FINDINGS: First lateral intraoperative image demonstrates posterior localizing instrument at the L4-5 level  Second lateral intraoperative image demonstrates posterior surgical instrument at the L2-3 level.  Degenerative disc disease at L5-S1 with disc space narrowing and vacuum disc. Degenerative facet disease in the mid  and lower lumbar spine. Slight anterolisthesis of L4 on L5, stable.  IMPRESSION: Intraoperative localization as above.   Electronically Signed   By: Charlett Nose M.D.   On: 05/05/2015 11:48   Dg C-arm 1-60 Min  05/05/2015   CLINICAL DATA:  L4-5 posterior fusion  EXAM: DG C-ARM 61-120 MIN; LUMBAR SPINE - 2-3 VIEW  COMPARISON:  04/08/2015  FLUOROSCOPY TIME:  Radiation Exposure Index (as provided by the fluoroscopic device):  If the device does not provide the exposure index:  Fluoroscopy Time:  18 seconds  Number of Acquired Images:  0  FINDINGS: Two intraoperative spot images demonstrate posterior fusion changes at L4-5. No hardware or bony complicating feature.  IMPRESSION: Posterior fusion at L4-5.   Electronically Signed   By: Charlett Nose M.D.   On: 05/05/2015 11:50    Assessment/Plan: The patient is doing well. I spoke with his wife and daughter.  LOS: 0 days     Lovette Merta D 05/05/2015, 12:32 PM

## 2015-05-05 NOTE — H&P (Signed)
Subjective: The patient is an 81 year old white male who has complained of back and leg pain consistent with neurogenic claudication. He has failed medical management and was worked up with a lumbar MRI and lumbar x-rays. This demonstrated multilevel spinal stenosis and spondylolisthesis at L4-5. I discussed the various treatment option with the patient. He has decided to proceed with surgery.   Past Medical History  Diagnosis Date  . ALLERGIC RHINITIS 05/04/2007  . CORONARY ARTERY DISEASE 05/04/2007  . DIVERTICULOSIS, COLON 08/14/2009  . ESOPHAGEAL STRICTURE 08/14/2009  . GERD 05/04/2007  . HIATAL HERNIA 08/14/2009  . HYPERLIPIDEMIA 05/04/2007  . HYPERTENSION 05/04/2007  . Osteoarth NOS-Unspec 05/04/2007  . PEPTIC ULCER DISEASE 05/04/2007    bleeding ulcer with hospitalization  . DJD (degenerative joint disease)   . Myocardial infarction 1987  . Torn rotator cuff     right  . Internal hemorrhoids   . BPH (benign prostatic hyperplasia)     Past Surgical History  Procedure Laterality Date  . Cataract extraction Bilateral   . Cardiac catheterization  06/12/2007    EF 45%  . Cardiac catheterization  01/18/2005    EF 50-55%  . Cardiac catheterization  01/14/2003    EF 55%  . US echocardiography  08/23/2005    EF 50-55%  . Cardiovascular stress test  02/07/2001  . Tonsillectomy    . Shoulder arthroscopy  2013    right  . Shoulder arthroscopy with rotator cuff repair and subacromial decompression Left 05/14/2014    Procedure: LEFT SHOULDER ARTHROSCOPY WITH DEBRIDEMENT EXTENSIVE, DISAL CLAVICULECTOMY, SUBACROMIAL DECOMPRESSION PARTIAL ACROMIOPLASTY WITH CORACOACROMIAL RELEASE AND ROTATOR CUFF REPAIR;  Surgeon: Nilda Simmer, MD;  Location: [facility];  Service: Orthopedics;  Laterality: Left;  . Coronary angioplasty with stent placement      1998  . Coronary angioplasty      1987  . Eye surgery    . Coronary artery bypass graft      X 4    Allergies  Allergen Reactions  . Statins  Other (See Comments)    Muscle aches  . Crestor [Rosuvastatin Calcium]     Aches     History  Substance Use Topics  . Smoking status: Former Smoker -- 4.00 packs/day for 35 years    Quit date: 11/29/1985  . Smokeless tobacco: Never Used  . Alcohol Use: 4.2 oz/week    7 Shots of liquor per week     Comment: occ    Family History  Problem Relation Age of Onset  . Heart attack Mother   . Heart attack Father   . Prostate cancer Brother   . Colon cancer Neg Hx    Prior to Admission medications   Medication Sig Start Date End Date Taking? Authorizing Provider  acetaminophen (TYLENOL) 650 MG CR tablet Take 650 mg by mouth every 8 (eight) hours as needed for pain.   Yes Historical Provider, MD  ALPHAGAN P 0.1 % SOLN Place 1 drop into the right eye 2 (two) times daily.  04/03/13  Yes Historical Provider, MD  aspirin 81 MG tablet Take 81 mg by mouth daily.     Yes Historical Provider, MD  calcium-vitamin D (OSCAL WITH D) 500-200 MG-UNIT per tablet Take 2 tablets by mouth daily with breakfast.   Yes Historical Provider, MD  celecoxib (CELEBREX) 200 MG capsule TAKE 1 CAPSULE BY MOUTH 2 TIMES A DAY 02/06/15  Yes Gordy Savers, MD  clopidogrel (PLAVIX) 75 MG tablet TAKE 1 TABLET BY MOUTH DAILY  11/25/14  Yes Gordy Savers, MD  ezetimibe (ZETIA) 10 MG tablet Take 1 tablet (10 mg total) by mouth daily. 10/11/14  Yes Vesta Mixer, MD  fish oil-omega-3 fatty acids 1000 MG capsule Take 1,000 mg by mouth 3 (three) times daily.     Yes Historical Provider, MD  loratadine (CLARITIN) 10 MG tablet Take 10 mg by mouth daily.     Yes Historical Provider, MD  Multiple Vitamins-Minerals (PRESERVISION/LUTEIN PO) Take by mouth daily.     Yes Historical Provider, MD  NEXIUM 40 MG capsule Take 1 capsule (40 mg total) by mouth daily before breakfast. 12/31/14  Yes Hart Carwin, MD  predniSONE (DELTASONE) 5 MG tablet TAKE 1 TABLET BY MOUTH DAILY 11/25/14  Yes Gordy Savers, MD  tamsulosin (FLOMAX)  0.4 MG CAPS capsule TAKE 1 CAPSULE (0.4 MG TOTAL) BY MOUTH DAILY. Patient taking differently: TAKE 1 CAPSULE (0.4 MG TOTAL) BY MOUTH twice daily 12/02/14  Yes Gordy Savers, MD  temazepam (RESTORIL) 30 MG capsule TAKE 1 CAPSULE AT BEDTIME AS NEEDED FOR SLEEP 02/06/15  Yes Gordy Savers, MD  sucralfate (CARAFATE) 1 GM/10ML suspension Take 2 teaspoons twice a day 12/31/14   Hart Carwin, MD     Review of Systems  Positive ROS: As above  All other systems have been reviewed and were otherwise negative with the exception of those mentioned in the HPI and as above.  Objective: Vital signs in last 24 hours: Temp:  [97.7 F (36.5 C)] 97.7 F (36.5 C) (06/06 0612) Pulse Rate:  [61] 61 (06/06 0612) Resp:  [20] 20 (06/06 0612) BP: (142)/(71) 142/71 mmHg (06/06 0612) SpO2:  [97 %] 97 % (06/06 0612) Weight:  [69.4 kg (153 lb)] 69.4 kg (153 lb) (06/06 0612)  General Appearance: Alert, cooperative, no distress, Head: Normocephalic, without obvious abnormality, atraumatic Eyes: PERRL, conjunctiva/corneas clear, EOM's intact,    Ears: Normal  Throat: Normal  Neck: Supple, symmetrical, trachea midline, no adenopathy; thyroid: No enlargement/tenderness/nodules; no carotid bruit or JVD Back: Symmetric, no curvature, ROM normal, no CVA tenderness Lungs: Clear to auscultation bilaterally, respirations unlabored Heart: Regular rate and rhythm, no murmur, rub or gallop Abdomen: Soft, non-tender,, no masses, no organomegaly Extremities: Extremities normal, atraumatic, no cyanosis or edema Pulses: 2+ and symmetric all extremities Skin: Skin color, texture, turgor normal, no rashes or lesions  NEUROLOGIC:   Mental status: alert and oriented, no aphasia, good attention span, Fund of knowledge/ memory ok Motor Exam - grossly normal Sensory Exam - grossly normal Reflexes:  Coordination - grossly normal Gait - grossly normal Balance - grossly normal Cranial Nerves: I: smell Not tested  II:  visual acuity  OS: Normal  OD: Normal   II: visual fields Full to confrontation  II: pupils Equal, round, reactive to light  III,VII: ptosis None  III,IV,VI: extraocular muscles  Full ROM  V: mastication Normal  V: facial light touch sensation  Normal  V,VII: corneal reflex  Present  VII: facial muscle function - upper  Normal  VII: facial muscle function - lower Normal  VIII: hearing Not tested  IX: soft palate elevation  Normal  IX,X: gag reflex Present  XI: trapezius strength  5/5  XI: sternocleidomastoid strength 5/5  XI: neck flexion strength  5/5  XII: tongue strength  Normal    Data Review Lab Results  Component Value Date   WBC 6.3 04/30/2015   HGB 12.0* 04/30/2015   HCT 38.5* 04/30/2015   MCV 89.1 04/30/2015  PLT 245 04/30/2015   Lab Results  Component Value Date   NA 138 04/30/2015   K 4.2 04/30/2015   CL 99* 04/30/2015   CO2 29 04/30/2015   BUN 13 04/30/2015   CREATININE 1.03 04/30/2015   GLUCOSE 110* 04/30/2015   Lab Results  Component Value Date   INR 1.03 04/30/2015    Assessment/Plan: L2-3, L3-4 and L4-5 spondylolisthesis, spinal stenosis, lumbago, lumbar radiculopathy, neurogenic claudication: I have discussed the situation with the patient and his wife. I have reviewed the imaging studies with him and pointed out the abnormality. We have discussed the various treatment options including surgery. I have described the surgical treatment option of an L2-3, L3-4 and L4-5 laminectomy with instrumentation and fusion at L4-5. I have shown him surgical models. We have discussed the risks, benefits, alternatives, and likelihood of achieving her goals with surgery. I have answered all the patient's questions. He has decided to proceed with surgery.   Chief Walkup D 05/05/2015 7:23 AM

## 2015-05-05 NOTE — Anesthesia Postprocedure Evaluation (Signed)
  Anesthesia Post-op Note  Patient: Dakota Green  Procedure(s) Performed: Procedure(s) with comments: Lumbar three- four and Lumbar four- five Laminectomies with Lumbar four- five, Posterior Lumbar Interbody Fusion with Interbody Prosthesis, Posterior Lateral Arthrodesis and Posterior Segmental Instrumentation (N/A) - L34 and L45 laminectomies with L45 posterior lumbar interbody fusion with interbody prosthesis posterior lateral arthrodesis and posterior nonsegmental instrumentation  Patient Location: PACU  Anesthesia Type:General  Level of Consciousness: awake, alert  and oriented  Airway and Oxygen Therapy: Patient Spontanous Breathing and Patient connected to nasal cannula oxygen  Post-op Pain: mild  Post-op Assessment: Post-op Vital signs reviewed, Patient's Cardiovascular Status Stable, Respiratory Function Stable, Patent Airway and Pain level controlled  Post-op Vital Signs: stable  Last Vitals:  Filed Vitals:   05/05/15 1340  BP: 127/74  Pulse: 97  Temp: 36.8 C  Resp: 18    Complications: No apparent anesthesia complications

## 2015-05-06 LAB — BASIC METABOLIC PANEL
ANION GAP: 7 (ref 5–15)
BUN: 12 mg/dL (ref 6–20)
CALCIUM: 9.3 mg/dL (ref 8.9–10.3)
CHLORIDE: 98 mmol/L — AB (ref 101–111)
CO2: 33 mmol/L — ABNORMAL HIGH (ref 22–32)
Creatinine, Ser: 0.76 mg/dL (ref 0.61–1.24)
GLUCOSE: 127 mg/dL — AB (ref 65–99)
POTASSIUM: 4.7 mmol/L (ref 3.5–5.1)
SODIUM: 138 mmol/L (ref 135–145)

## 2015-05-06 LAB — CBC
HCT: 30.2 % — ABNORMAL LOW (ref 39.0–52.0)
HEMOGLOBIN: 9.5 g/dL — AB (ref 13.0–17.0)
MCH: 28 pg (ref 26.0–34.0)
MCHC: 31.5 g/dL (ref 30.0–36.0)
MCV: 89.1 fL (ref 78.0–100.0)
PLATELETS: 224 10*3/uL (ref 150–400)
RBC: 3.39 MIL/uL — ABNORMAL LOW (ref 4.22–5.81)
RDW: 15.3 % (ref 11.5–15.5)
WBC: 9.4 10*3/uL (ref 4.0–10.5)

## 2015-05-06 MED ORDER — TRAMADOL HCL 50 MG PO TABS
50.0000 mg | ORAL_TABLET | Freq: Four times a day (QID) | ORAL | Status: DC | PRN
  Administered 2015-05-06 – 2015-05-07 (×4): 100 mg via ORAL
  Filled 2015-05-06 (×3): qty 2

## 2015-05-06 MED ORDER — TAMSULOSIN HCL 0.4 MG PO CAPS
0.4000 mg | ORAL_CAPSULE | Freq: Two times a day (BID) | ORAL | Status: DC
  Administered 2015-05-06 – 2015-05-07 (×2): 0.4 mg via ORAL
  Filled 2015-05-06 (×2): qty 1

## 2015-05-06 MED ORDER — TRAMADOL HCL 50 MG PO TABS
50.0000 mg | ORAL_TABLET | Freq: Four times a day (QID) | ORAL | Status: DC | PRN
  Filled 2015-05-06 (×2): qty 1

## 2015-05-06 MED FILL — Heparin Sodium (Porcine) Inj 1000 Unit/ML: INTRAMUSCULAR | Qty: 30 | Status: AC

## 2015-05-06 MED FILL — Sodium Chloride IV Soln 0.9%: INTRAVENOUS | Qty: 1000 | Status: AC

## 2015-05-06 NOTE — Progress Notes (Signed)
Pt reported experiencing hallucinations around 1500.  The wife states it was around 1300.  States he is seeing monsters and things on the walls.  He states that he has had this happen before when taking vicodin.  Dr. Lovell Sheehan notified.  Pt requesting tramadol for pain.  Order obtained.  Will continue to monitor and offer support.

## 2015-05-06 NOTE — Progress Notes (Signed)
Patient ID: Dakota Green, male   DOB: 1933-01-02, 81 y.o.   MRN: [id number] Subjective:  The patient is alert and pleasant. He looks well. His back is appropriately sore.  Objective: Vital signs in last 24 hours: Temp:  [97.7 F (36.5 C)-98.6 F (37 C)] 98 F (36.7 C) (06/07 0415) Pulse Rate:  [64-99] 64 (06/07 0415) Resp:  [10-20] 18 (06/07 0415) BP: (101-134)/(60-74) 121/72 mmHg (06/07 0415) SpO2:  [95 %-100 %] 99 % (06/07 0415)  Intake/Output from previous day: 06/06 0701 - 06/07 0700 In: 1960 [P.O.:360; I.V.:1600] Out: 2850 [Urine:2280; Drains:220; Blood:350] Intake/Output this shift:    Physical exam the patient is alert and oriented. His dressing has a small bloodstained. His strength is normal in his lower extremities.  Lab Results: No results for input(s): WBC, HGB, HCT, PLT in the last 72 hours. BMET No results for input(s): NA, K, CL, CO2, GLUCOSE, BUN, CREATININE, CALCIUM in the last 72 hours.  Studies/Results: Dg Lumbar Spine 2-3 Views  05/05/2015   CLINICAL DATA:  L4-5 posterior fusion  EXAM: DG C-ARM 61-120 MIN; LUMBAR SPINE - 2-3 VIEW  COMPARISON:  04/08/2015  FLUOROSCOPY TIME:  Radiation Exposure Index (as provided by the fluoroscopic device):  If the device does not provide the exposure index:  Fluoroscopy Time:  18 seconds  Number of Acquired Images:  0  FINDINGS: Two intraoperative spot images demonstrate posterior fusion changes at L4-5. No hardware or bony complicating feature.  IMPRESSION: Posterior fusion at L4-5.   Electronically Signed   By: Charlett Nose M.D.   On: 05/05/2015 11:50   Dg Lumbar Spine 2-3 Views  05/05/2015   CLINICAL DATA:  L3-4, L4-5 laminectomy.  L4-5 posterior fusion.  EXAM: LUMBAR SPINE - 2-3 VIEW  COMPARISON:  04/08/2015  FINDINGS: First lateral intraoperative image demonstrates posterior localizing instrument at the L4-5 level  Second lateral intraoperative image demonstrates posterior surgical instrument at the L2-3 level.  Degenerative  disc disease at L5-S1 with disc space narrowing and vacuum disc. Degenerative facet disease in the mid and lower lumbar spine. Slight anterolisthesis of L4 on L5, stable.  IMPRESSION: Intraoperative localization as above.   Electronically Signed   By: Charlett Nose M.D.   On: 05/05/2015 11:48   Dg C-arm 1-60 Min  05/05/2015   CLINICAL DATA:  L4-5 posterior fusion  EXAM: DG C-ARM 61-120 MIN; LUMBAR SPINE - 2-3 VIEW  COMPARISON:  04/08/2015  FLUOROSCOPY TIME:  Radiation Exposure Index (as provided by the fluoroscopic device):  If the device does not provide the exposure index:  Fluoroscopy Time:  18 seconds  Number of Acquired Images:  0  FINDINGS: Two intraoperative spot images demonstrate posterior fusion changes at L4-5. No hardware or bony complicating feature.  IMPRESSION: Posterior fusion at L4-5.   Electronically Signed   By: Charlett Nose M.D.   On: 05/05/2015 11:50    Assessment/Plan: Postop day #1: The patient is doing well. We will mobilize him. He will likely go home tomorrow. I spoke with the patient's wife.  LOS: 1 day     Syble Picco D 05/06/2015, 7:57 AM

## 2015-05-06 NOTE — Evaluation (Signed)
Physical Therapy Evaluation and Discharge Patient Details Name: Dakota Green MRN: [id number] DOB: February 02, 1933 Today's Date: 05/06/2015   History of Present Illness  81 y.o. male s/p Lumbar three- four and Lumbar four- five Laminectomies with Lumbar four- five, Posterior Lumbar Interbody Fusion with Interbody Prosthesis, Posterior Lateral Arthrodesis and Posterior Segmental Instrumentation (N/A) - L34 and L45 laminectomies with L45 posterior lumbar interbody fusion with interbody prosthesis posterior lateral arthrodesis and posterior nonsegmental instrumentation    Clinical Impression  Patient evaluated by Physical Therapy with no further acute PT needs identified. All education has been completed and the patient has no further questions. Patient mobilizing without an assistive device at a supervision level, ambulating up to 300 feet today. No loss of balance or LE buckling noted. Safely completed stair training. Patient with have 24 hour supervision from family as needed at discharge. See below for any follow-up Physial Therapy or equipment needs. PT is signing off. Thank you for this referral.     Follow Up Recommendations No PT follow up;Supervision - Intermittent    Equipment Recommendations  3in1 (PT)    Recommendations for Other Services       Precautions / Restrictions Precautions Precautions: Back Precaution Booklet Issued: Yes (comment) Precaution Comments: reviewed Required Braces or Orthoses: Spinal Brace Spinal Brace: Lumbar corset Restrictions Weight Bearing Restrictions: No      Mobility  Bed Mobility Overal bed mobility: Needs Assistance Bed Mobility: Rolling;Sidelying to Sit;Sit to Sidelying Rolling: Supervision Sidelying to sit: Supervision     Sit to sidelying: Supervision General bed mobility comments: Educated on log roll technique. VC to maintain back precautions with cues for technique throughout. No physical assist required  Transfers Overall transfer  level: Needs assistance Equipment used: None Transfers: Sit to/from Stand Sit to Stand: Supervision         General transfer comment: Supervision for safety VC to maintain neutral spine alignment and prevention of flexion. Performed from bed and recliner x2.  Ambulation/Gait Ambulation/Gait assistance: Supervision Ambulation Distance (Feet): 300 Feet Assistive device: None Gait Pattern/deviations: Step-through pattern;Wide base of support Gait velocity: decreased   General Gait Details: supervision for safety. Demonstrates wide base of support with minor sway but no loss of balance noted during bout. Pt able to safely step backwards up to 10 feet. No assistive device used.  Stairs Stairs: Yes Stairs assistance: Min guard Stair Management: No rails;Step to pattern;Forwards Number of Stairs: 2 General stair comments: VC for stair navigation without use of a rail at a min guard level for safety. Instructed on step-to gait pattern. No loss of balance during this task and pt states he feels confident.  Wheelchair Mobility    Modified Rankin (Stroke Patients Only)       Balance Overall balance assessment: Needs assistance Sitting-balance support: No upper extremity supported;Feet supported Sitting balance-Leahy Scale: Normal     Standing balance support: No upper extremity supported Standing balance-Leahy Scale: Good                               Pertinent Vitals/Pain Pain Assessment: 0-10 Pain Score: 8  Pain Location: back Pain Descriptors / Indicators: Aching Pain Intervention(s): Monitored during session;Repositioned    Home Living Family/patient expects to be discharged to:: Private residence Living Arrangements: Spouse/significant other Available Help at Discharge: Family;Available 24 hours/day Type of Home: House Home Access: Stairs to enter Entrance Stairs-Rails: None Entrance Stairs-Number of Steps: 2 Home Layout: One level;Laundry or work  area  in basement;Able to live on main level with bedroom/bathroom Home Equipment: Shower seat - built in;Hand held shower head;Other (comment) (elevated toilet)      Prior Function Level of Independence: Independent               Hand Dominance   Dominant Hand: Right    Extremity/Trunk Assessment   Upper Extremity Assessment: Defer to OT evaluation           Lower Extremity Assessment: Generalized weakness         Communication   Communication: No difficulties  Cognition Arousal/Alertness: Awake/alert Behavior During Therapy: WFL for tasks assessed/performed Overall Cognitive Status: Within Functional Limits for tasks assessed                      General Comments General comments (skin integrity, edema, etc.): Reviewed back precautions, safety with mobility, and application of TLSO.    Exercises General Exercises - Lower Extremity Long Arc Quad: Strengthening;Both;5 reps;Seated      Assessment/Plan    PT Assessment Patent does not need any further PT services  PT Diagnosis Difficulty walking;Generalized weakness;Acute pain   PT Problem List    PT Treatment Interventions     PT Goals (Current goals can be found in the Care Plan section) Acute Rehab PT Goals Patient Stated Goal: go home and start exercising soon PT Goal Formulation: All assessment and education complete, DC therapy    Frequency     Barriers to discharge        Co-evaluation               End of Session Equipment Utilized During Treatment: Back brace   Patient left: in chair;with call bell/phone within reach;with family/visitor present Nurse Communication: Mobility status         Time: 5409-8119 PT Time Calculation (min) (ACUTE ONLY): 28 min   Charges:   PT Evaluation $Initial PT Evaluation Tier I: 1 Procedure PT Treatments $Gait Training: 8-22 mins   PT G CodesBerton Mount 05/06/2015, 9:43 AM Charlsie Merles, PT [phone]

## 2015-05-07 MED ORDER — TRAMADOL HCL 50 MG PO TABS: 50.0000 mg | ORAL_TABLET | Freq: Four times a day (QID) | ORAL | Status: DC | PRN

## 2015-05-07 MED ORDER — DOCUSATE SODIUM 100 MG PO CAPS: 100.0000 mg | ORAL_CAPSULE | Freq: Two times a day (BID) | ORAL | Status: DC

## 2015-05-07 NOTE — Progress Notes (Signed)
Patient DC'd home via car with wife.  DC instructions and prescription given to patient.  Both fully understood.  Vital signs and assessments were stable.

## 2015-05-07 NOTE — Addendum Note (Signed)
Addendum  created 05/07/15 1427 by Kipp Brood, MD   Modules edited: Anesthesia Attestations

## 2015-05-07 NOTE — Discharge Summary (Signed)
Physician Discharge Summary  Patient ID: Dakota Green MRN: [id number] DOB/AGE: 30-Sep-1933 81 y.o.  Admit date: 05/05/2015 Discharge date: 05/07/2015  Admission Diagnoses: L2-3, L3-4 and L4-5 spinal stenosis, L4-5 spondylolisthesis, lumbago, lumbar radiculopathy, neurogenic claudication  Discharge Diagnoses: The same Active Problems:   Spondylolisthesis of lumbar region   Discharged Condition: good  Hospital Course: I performed an L2-3, L3-4 and L4-5 laminectomy with instrumentation and fusion at L4-5 on the patient on 05/05/2015. The surgery went well.  The patient's postoperative course was unremarkable. His pain was controlled on tramadol. On postoperative day #2 the patient, and his wife, requested discharge to home. They were given oral and written discharge instructions. All their questions were answered.  Consults: PT Significant Diagnostic Studies: None Treatments: L2-3, L3-4 and L4-5 laminectomy with L4-5 posterior lumbar area by fusion, interbody prosthesis, posterior nonsegmental instrumentation and posterior lateral arthrodesis. Discharge Exam: Blood pressure 113/60, pulse 65, temperature 97.9 F (36.6 C), temperature source Oral, resp. rate 18, height  (1.778 m), weight 69.4 kg (153 lb), SpO2 98 %. The patient is alert and pleasant. He looks well. His strength is normal in his lower extremities.  Disposition: Home  Discharge Instructions    Call MD for:  difficulty breathing, headache or visual disturbances    Complete by:  As directed      Call MD for:  extreme fatigue    Complete by:  As directed      Call MD for:  hives    Complete by:  As directed      Call MD for:  persistant dizziness or light-headedness    Complete by:  As directed      Call MD for:  persistant nausea and vomiting    Complete by:  As directed      Call MD for:  redness, tenderness, or signs of infection (pain, swelling, redness, odor or green/yellow discharge around incision site)     Complete by:  As directed      Call MD for:  severe uncontrolled pain    Complete by:  As directed      Call MD for:  temperature >100.4    Complete by:  As directed      Diet - low sodium heart healthy    Complete by:  As directed      Discharge instructions    Complete by:  As directed   Call [phone] for a followup appointment. Take a stool softener while you are using pain medications.     Driving Restrictions    Complete by:  As directed   Do not drive for 2 weeks.     Increase activity slowly    Complete by:  As directed      Lifting restrictions    Complete by:  As directed   Do not lift more than 5 pounds. No excessive bending or twisting.     May shower / Bathe    Complete by:  As directed   He may shower after the pain she is removed 3 days after surgery. Leave the incision alone.     Remove dressing in 24 hours    Complete by:  As directed             Medication List    STOP taking these medications        acetaminophen 650 MG CR tablet  Commonly known as:  TYLENOL      TAKE these medications  ALPHAGAN P 0.1 % Soln  Generic drug:  brimonidine  Place 1 drop into the right eye 2 (two) times daily.     aspirin 81 MG tablet  Take 81 mg by mouth daily.     calcium-vitamin D 500-200 MG-UNIT per tablet  Commonly known as:  OSCAL WITH D  Take 2 tablets by mouth daily with breakfast.     celecoxib 200 MG capsule  Commonly known as:  CELEBREX  TAKE 1 CAPSULE BY MOUTH 2 TIMES A DAY     clopidogrel 75 MG tablet  Commonly known as:  PLAVIX  TAKE 1 TABLET BY MOUTH DAILY     docusate sodium 100 MG capsule  Commonly known as:  COLACE  Take 1 capsule (100 mg total) by mouth 2 (two) times daily.     ezetimibe 10 MG tablet  Commonly known as:  ZETIA  Take 1 tablet (10 mg total) by mouth daily.     fish oil-omega-3 fatty acids 1000 MG capsule  Take 1,000 mg by mouth 3 (three) times daily.     loratadine 10 MG tablet  Commonly known as:  CLARITIN   Take 10 mg by mouth daily.     NEXIUM 40 MG capsule  Generic drug:  esomeprazole  Take 1 capsule (40 mg total) by mouth daily before breakfast.     predniSONE 5 MG tablet  Commonly known as:  DELTASONE  TAKE 1 TABLET BY MOUTH DAILY     PRESERVISION/LUTEIN PO  Take by mouth daily.     sucralfate 1 GM/10ML suspension  Commonly known as:  CARAFATE  Take 2 teaspoons twice a day     tamsulosin 0.4 MG Caps capsule  Commonly known as:  FLOMAX  TAKE 1 CAPSULE (0.4 MG TOTAL) BY MOUTH DAILY.     temazepam 30 MG capsule  Commonly known as:  RESTORIL  TAKE 1 CAPSULE AT BEDTIME AS NEEDED FOR SLEEP     traMADol 50 MG tablet  Commonly known as:  ULTRAM  Take 1-2 tablets (50-100 mg total) by mouth every 6 (six) hours as needed for moderate pain (1-2 tablets q6hrs prn).         SignedCristi Loron 05/07/2015, 1:47 PM

## 2015-05-10 ENCOUNTER — Other Ambulatory Visit: Payer: Self-pay | Admitting: Internal Medicine

## 2015-05-21 ENCOUNTER — Other Ambulatory Visit: Payer: Self-pay | Admitting: Internal Medicine

## 2015-05-22 ENCOUNTER — Other Ambulatory Visit: Payer: Self-pay | Admitting: Internal Medicine

## 2015-06-23 ENCOUNTER — Other Ambulatory Visit: Payer: Self-pay | Admitting: Internal Medicine

## 2015-07-14 ENCOUNTER — Other Ambulatory Visit (INDEPENDENT_AMBULATORY_CARE_PROVIDER_SITE_OTHER): Payer: Medicare Other

## 2015-07-14 DIAGNOSIS — E785 Hyperlipidemia, unspecified: Secondary | ICD-10-CM | POA: Diagnosis not present

## 2015-07-14 DIAGNOSIS — I10 Essential (primary) hypertension: Secondary | ICD-10-CM

## 2015-07-14 DIAGNOSIS — Z0000 Encounter for general adult medical examination without abnormal findings: Secondary | ICD-10-CM | POA: Diagnosis not present

## 2015-07-14 DIAGNOSIS — Z125 Encounter for screening for malignant neoplasm of prostate: Secondary | ICD-10-CM

## 2015-07-14 LAB — BASIC METABOLIC PANEL
BUN: 13 mg/dL (ref 6–23)
CHLORIDE: 103 meq/L (ref 96–112)
CO2: 32 mEq/L (ref 19–32)
Calcium: 9.2 mg/dL (ref 8.4–10.5)
Creatinine, Ser: 0.75 mg/dL (ref 0.40–1.50)
GFR: 106.34 mL/min (ref >60.00)
GLUCOSE: 99 mg/dL (ref 70–99)
POTASSIUM: 4.8 meq/L (ref 3.5–5.1)
SODIUM: 140 meq/L (ref 135–145)

## 2015-07-14 LAB — LIPID PANEL
CHOL/HDL RATIO: 3
Cholesterol: 162 mg/dL (ref 0–200)
HDL: 56.9 mg/dL (ref >39.00)
LDL CALC: 91 mg/dL (ref 0–99)
NonHDL: 104.98
Triglycerides: 71 mg/dL (ref 0.0–149.0)
VLDL: 14.2 mg/dL (ref 0.0–40.0)

## 2015-07-14 LAB — CBC WITH DIFFERENTIAL/PLATELET
BASOS PCT: 0.3 % (ref 0.0–3.0)
Basophils Absolute: 0 10*3/uL (ref 0.0–0.1)
EOS PCT: 6 % — AB (ref 0.0–5.0)
Eosinophils Absolute: 0.2 10*3/uL (ref 0.0–0.7)
HCT: 31.3 % — ABNORMAL LOW (ref 39.0–52.0)
Hemoglobin: 10 g/dL — ABNORMAL LOW (ref 13.0–17.0)
LYMPHS ABS: 0.8 10*3/uL (ref 0.7–4.0)
Lymphocytes Relative: 19.4 % (ref 12.0–46.0)
MCHC: 31.9 g/dL (ref 30.0–36.0)
MCV: 81.6 fl (ref 78.0–100.0)
Monocytes Absolute: 0.6 10*3/uL (ref 0.1–1.0)
Neutro Abs: 2.3 10*3/uL (ref 1.4–7.7)
Neutrophils Relative %: 58.2 % (ref 43.0–77.0)
Platelets: 203 10*3/uL (ref 150.0–400.0)
RBC: 3.83 Mil/uL — AB (ref 4.22–5.81)
RDW: 16.6 % — ABNORMAL HIGH (ref 11.5–15.5)
WBC: 4 10*3/uL (ref 4.0–10.5)

## 2015-07-14 LAB — HEPATIC FUNCTION PANEL
ALBUMIN: 3.7 g/dL (ref 3.5–5.2)
ALT: 16 U/L (ref 0–53)
AST: 25 U/L (ref 0–37)
Alkaline Phosphatase: 48 U/L (ref 39–117)
Bilirubin, Direct: 0.1 mg/dL (ref 0.0–0.3)
Total Bilirubin: 0.4 mg/dL (ref 0.2–1.2)
Total Protein: 6.3 g/dL (ref 6.0–8.3)

## 2015-07-14 LAB — POCT URINALYSIS DIPSTICK
BILIRUBIN UA: NEGATIVE
Blood, UA: NEGATIVE
Glucose, UA: NEGATIVE
KETONES UA: NEGATIVE
Leukocytes, UA: NEGATIVE
Nitrite, UA: NEGATIVE
Protein, UA: NEGATIVE
SPEC GRAV UA: 1.01
Urobilinogen, UA: 0.2
pH, UA: 6.5

## 2015-07-14 LAB — TSH: TSH: 1.9 u[IU]/mL (ref 0.35–4.50)

## 2015-07-14 LAB — PSA: PSA: 1.66 ng/mL (ref 0.10–4.00)

## 2015-07-21 ENCOUNTER — Encounter: Payer: Medicare Other | Admitting: Internal Medicine

## 2015-07-26 IMAGING — CR DG CHEST 2V
2 series · 2 of 2 positions shown · non-contrast
Comparison: None.

CLINICAL DATA: Preoperative respiratory evaluation for spine
surgery.

EXAM:
CHEST  2 VIEW

[w chest pa]
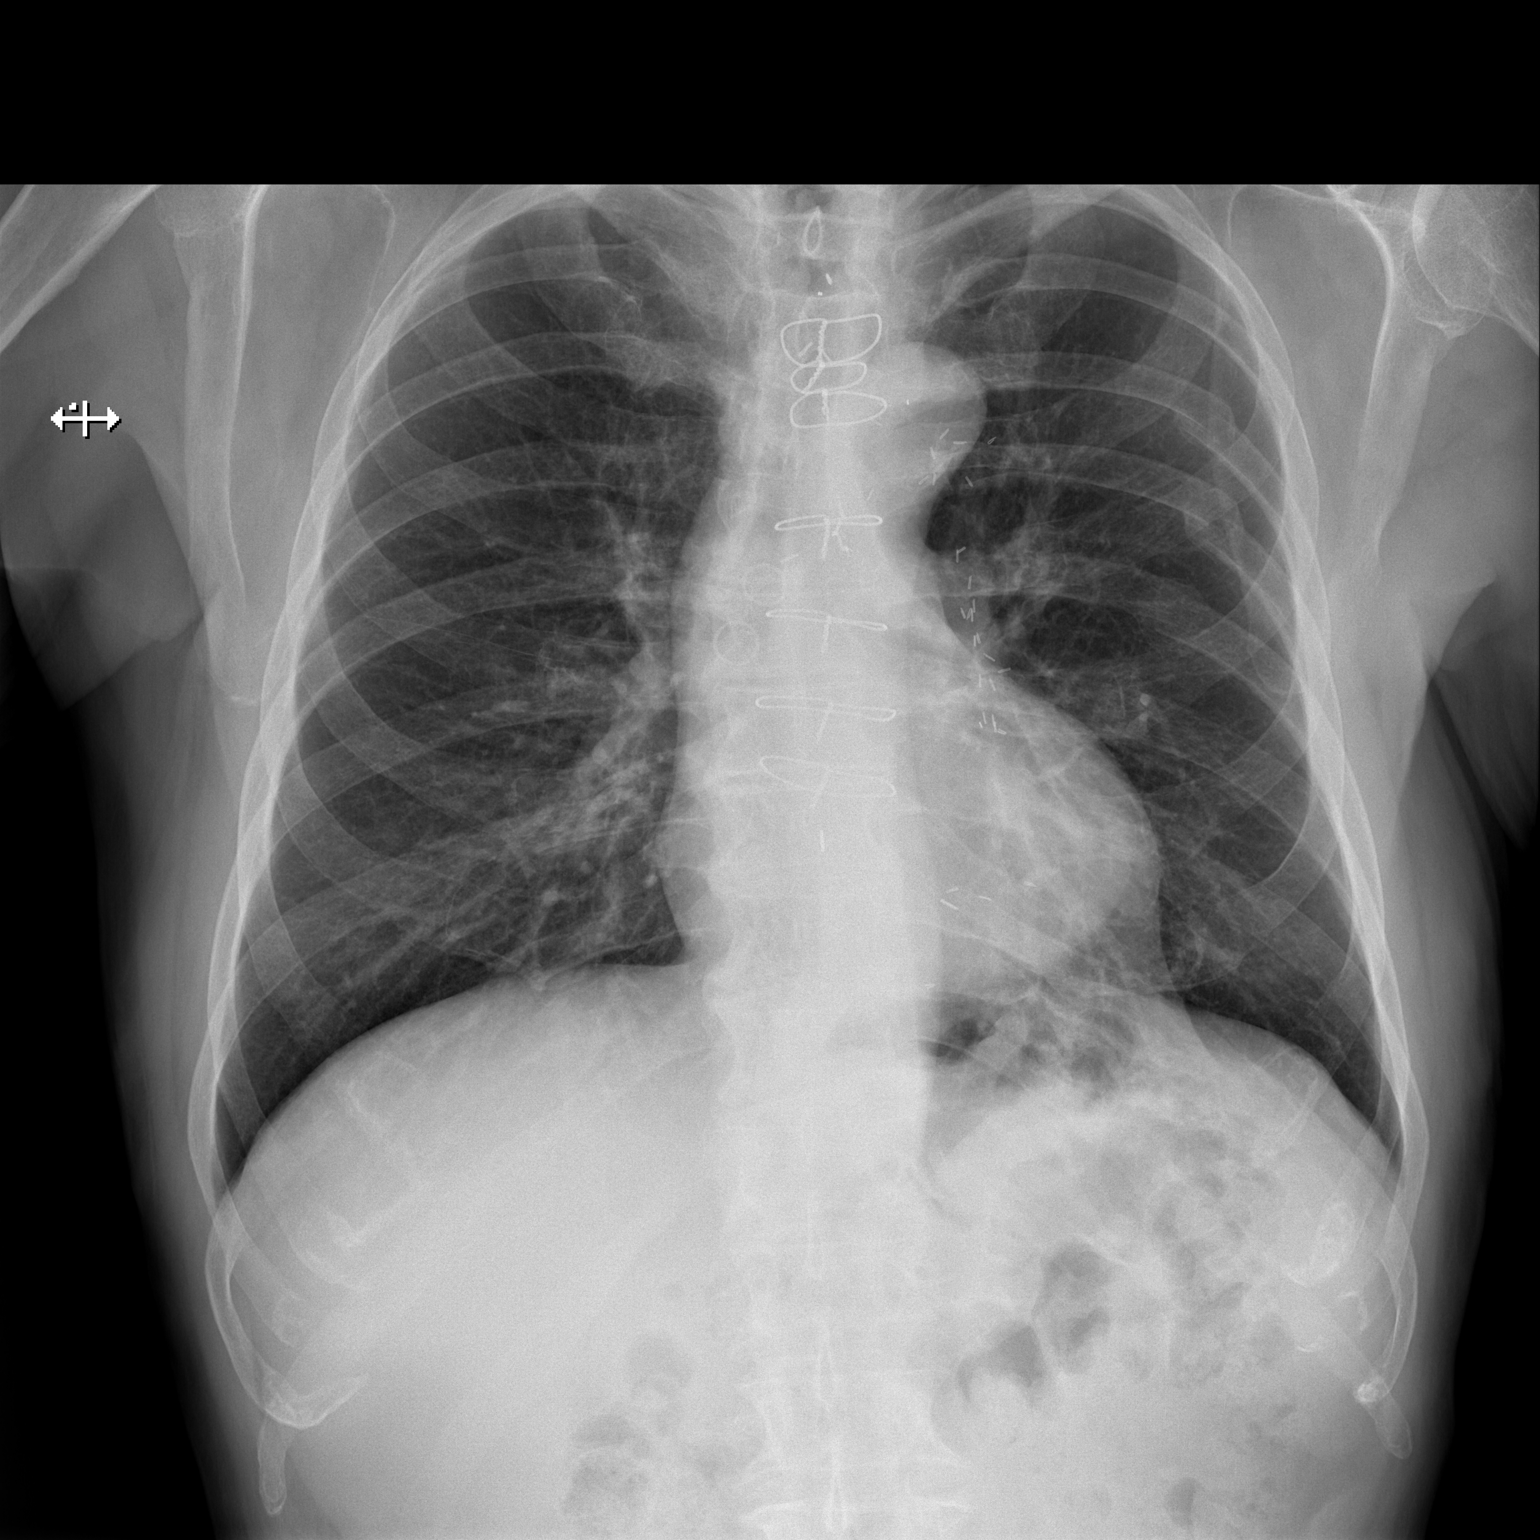

[w chest lat]
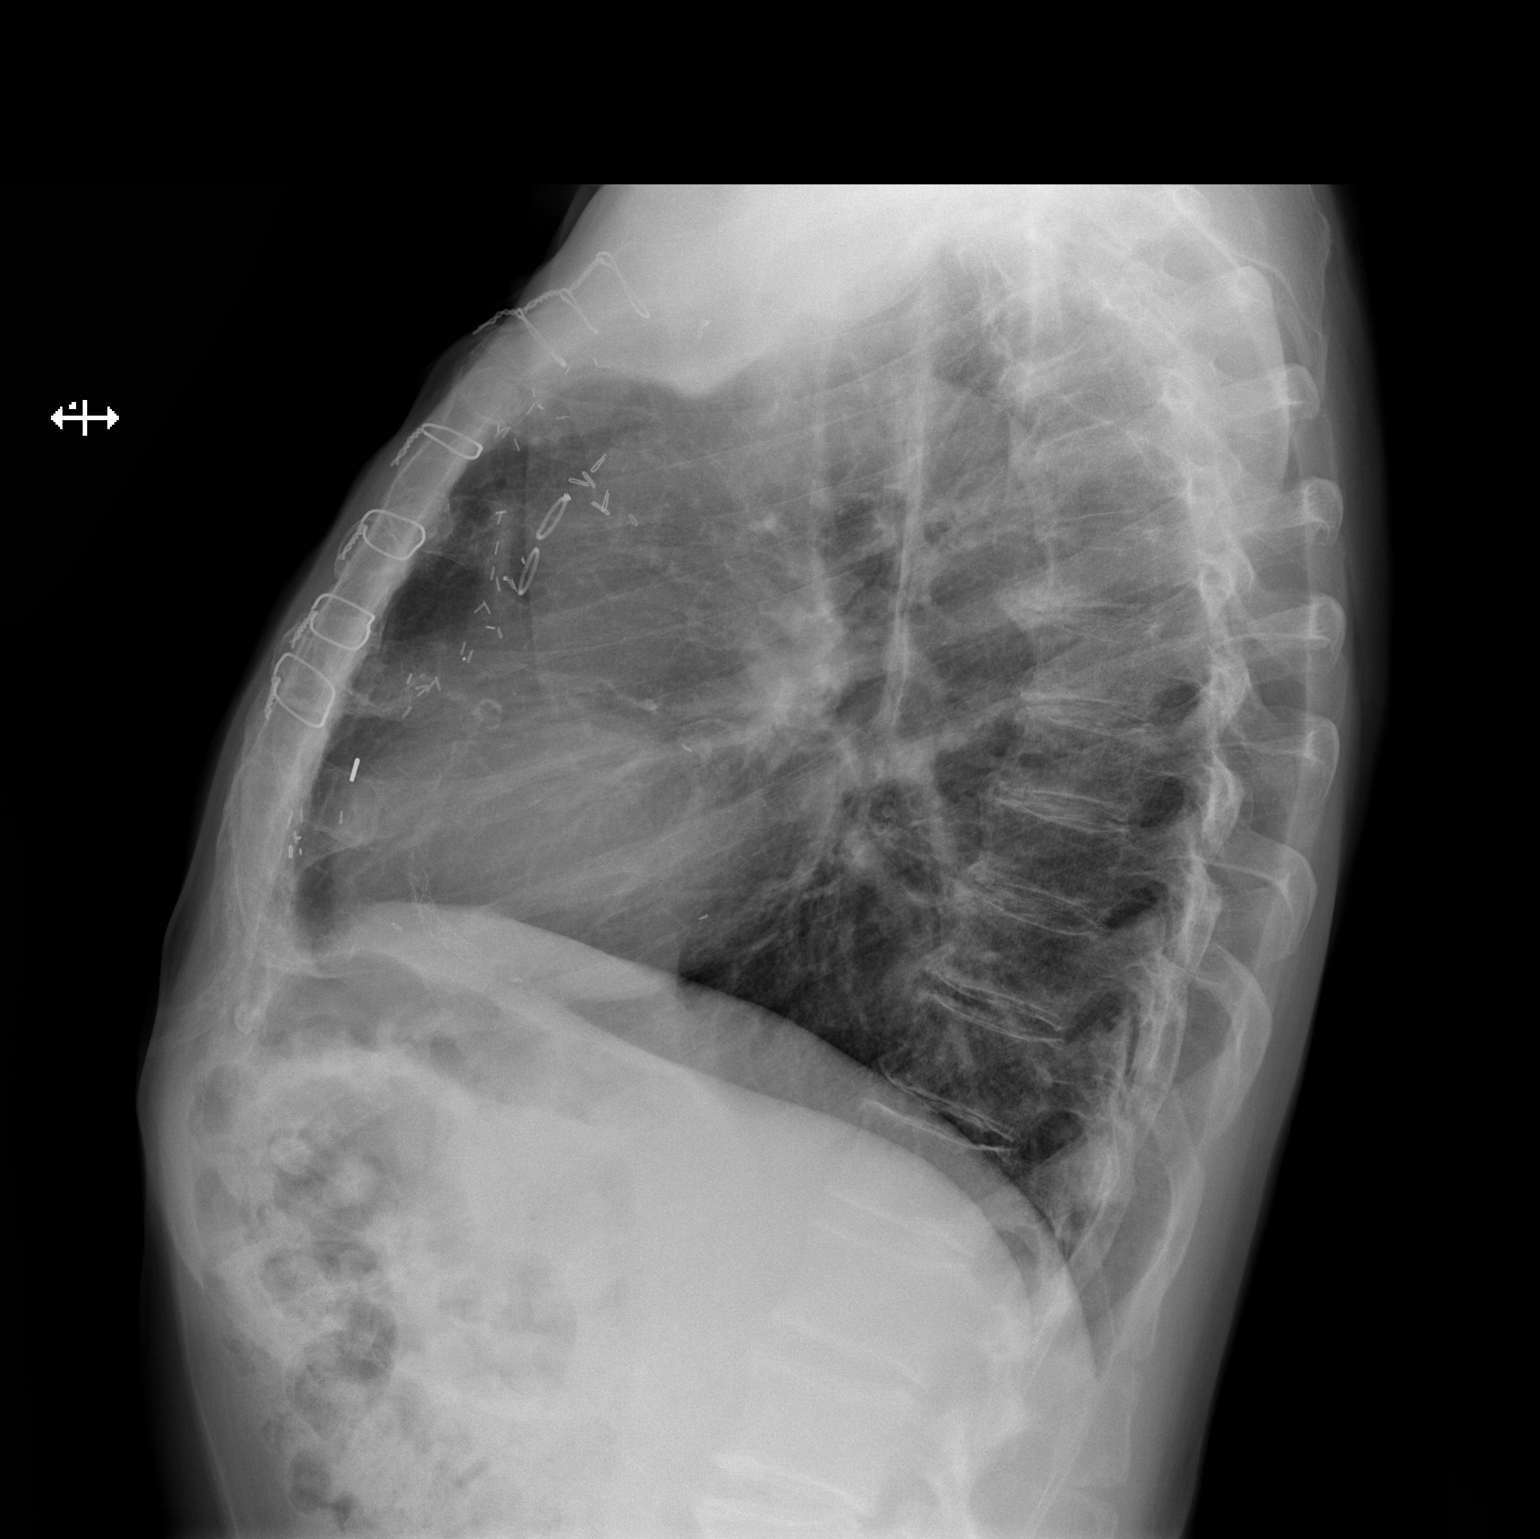

[2 of 2 positions shown; findings below may reference images not displayed]

FINDINGS: The lungs are clear without focal infiltrate, edema, pneumothorax or
pleural effusion. The cardiopericardial silhouette is within normal
limits for size. Patient is status post CABG. Old left-sided rib
fractures noted.
IMPRESSION: No acute cardiopulmonary findings.

## 2015-07-29 ENCOUNTER — Ambulatory Visit (INDEPENDENT_AMBULATORY_CARE_PROVIDER_SITE_OTHER): Payer: Medicare Other | Admitting: Internal Medicine

## 2015-07-29 ENCOUNTER — Encounter: Payer: Self-pay | Admitting: Internal Medicine

## 2015-07-29 VITALS — BP 110/70 | HR 94 | Temp 98.2°F | Resp 20 | Ht 70.0 in | Wt 154.0 lb

## 2015-07-29 DIAGNOSIS — I2510 Atherosclerotic heart disease of native coronary artery without angina pectoris: Secondary | ICD-10-CM | POA: Diagnosis not present

## 2015-07-29 DIAGNOSIS — I10 Essential (primary) hypertension: Secondary | ICD-10-CM | POA: Diagnosis not present

## 2015-07-29 DIAGNOSIS — Z0000 Encounter for general adult medical examination without abnormal findings: Secondary | ICD-10-CM

## 2015-07-29 DIAGNOSIS — M4806 Spinal stenosis, lumbar region: Secondary | ICD-10-CM | POA: Diagnosis not present

## 2015-07-29 DIAGNOSIS — E785 Hyperlipidemia, unspecified: Secondary | ICD-10-CM

## 2015-07-29 DIAGNOSIS — D500 Iron deficiency anemia secondary to blood loss (chronic): Secondary | ICD-10-CM

## 2015-07-29 DIAGNOSIS — M48061 Spinal stenosis, lumbar region without neurogenic claudication: Secondary | ICD-10-CM

## 2015-07-29 NOTE — Progress Notes (Signed)
Subjective:    Patient ID: Dakota Green, male    DOB: Aug 30, 1933, 81 y.o.   MRN: [id number]  HPI   Subjective:    Patient ID: Dakota Green, male    DOB: 07-25-1933, 81 y.o.   MRN: [id number]  HPI  81 -year-old patient who is seen today for followup and any preventive health examination.  He Is followed by cardiology for coronary artery disease.  He continues to do quite well.  He denies any cardiopulmonary complaints.  He has dyslipidemia, but has been statin intolerant. He has a history of inflammatory osteoarthritis and has done quite well on prednisone 5 mg daily. Attempts at discontinuation have resulted in significant arthritic pain. He also remains on Celebrex. Denies any cardiopulmonary complaints  laboratory studies reviewed and revealed some moderate anemia.   He is status post surgery for spinal stenosis, lumbar region 05/05/2015 He is status post EDG February 2016 for esophageal stricture   Here for Medicare AWV:   1. Risk factors based on Past M, S, F history: patient has known coronary artery disease, history of dyslipidemia, hypertension.  2. Physical Activities: goes to the gym 4 times weekly  3. Depression/mood: no history of depression, or mood disorder  4. Hearing: no deficits  5. ADL's: independent in all aspects of daily living  6. Fall Risk: low  7. Home Safety: no problems identified  8. Height, weight, &visual acuity:height and weight stable. No difficulty with visual acuity  9. Counseling: heart healthy diet, continued regular exercise, all encouraged  10. Labs ordered based on risk factors: laboratory profile, including lipid profile, and sedimentation rate will be reviewed  11. Referral Coordination- rheumatology referral as scheduled  12. Care Plan- will follow up with rheumatology. Will review a sedimentation rate will also discontinue at least temporarily. The Crestor  13. Cognitive Assessment- alert and oriented, with normal affect. No history of  memory concerns handles all executive functioning without difficulty  14.   Preventive services will include annual clinical exams with screening lab. Annual eye examinations.  Encouraged  15.   Provider list includes primary care orthopedics ophthalmology and GI  Allergies (verified):  No Known Drug Allergies  Past History:  Past Medical History:   Coronary artery disease  GERD stricture formation  Hyperlipidemia  Hypertension  Peptic ulcer disease  Allergic rhinitis  DJD  Osteoarthritis   Past Surgical History:   Coronary artery bypass graft 1999  Cardiac Stent placement-1987, 1998  Cataract extraction-bilaterally  cardiac catheterization February 2009  EGD 2007  2016 colonoscopy 2004  2010  Rotator cuff repair  Family History:   father died age 12 mother died age 81, MI, hypertension  6 brothers 3 sisters, positive for lung and prostate cancer, coronary artery disease  Family History of Prostate Cancer: Brother  No FH of Colon Cancer:  Family History of Heart Disease: Mother   Social History:   Retired  Married  Patient is a former smoker. -stopped 37  Alcohol Use - yes-4 drinks daily  Daily Caffeine Use-1 cup daily  Illicit Drug Use - no   Review of Systems  Constitutional: Negative for fever, chills, activity change, appetite change and fatigue.  HENT: Negative for hearing loss, ear pain, congestion, rhinorrhea, sneezing, mouth sores, trouble swallowing, neck pain, neck stiffness, dental problem, voice change, sinus pressure and tinnitus.   Eyes: Negative for photophobia, pain, redness and visual disturbance.  Respiratory: Negative for apnea, cough, choking, chest tightness, shortness of breath and wheezing.  Cardiovascular: Negative for chest pain, palpitations and leg swelling.  Gastrointestinal: Negative for nausea, vomiting, abdominal pain, diarrhea, constipation, blood in stool, abdominal distention, anal bleeding and rectal pain.  Genitourinary:  Negative for dysuria, urgency, frequency, hematuria, flank pain, decreased urine volume, discharge, penile swelling, scrotal swelling, difficulty urinating, genital sores and testicular pain.  Musculoskeletal: Positive for back pain, joint swelling and arthralgias. Negative for myalgias and gait problem.  Skin: Negative for color change, rash and wound.  Neurological: Negative for dizziness, tremors, seizures, syncope, facial asymmetry, speech difficulty, weakness, light-headedness, numbness and headaches.  Hematological: Negative for adenopathy. Does not bruise/bleed easily.  Psychiatric/Behavioral: Negative for suicidal ideas, hallucinations, behavioral problems, confusion, sleep disturbance, self-injury, dysphoric mood, decreased concentration and agitation. The patient is not nervous/anxious.        Objective:   Physical Exam  Constitutional: He appears well-developed and well-nourished.  HENT:  Head: Normocephalic and atraumatic.  Right Ear: External ear normal.  Left Ear: External ear normal.  Nose: Nose normal.  Mouth/Throat: Oropharynx is clear and moist.  Eyes: Conjunctivae and EOM are normal. Pupils are equal, round, and reactive to light. No scleral icterus.  Neck: Normal range of motion. Neck supple. No JVD present. No thyromegaly present.  Cardiovascular: Regular rhythm, normal heart sounds and intact distal pulses.  Exam reveals no gallop and no friction rub.   No murmur heard.      Posterior tibial pulses not easily palpable  Pulmonary/Chest: Effort normal and breath sounds normal. He exhibits no tenderness.  Abdominal: Soft. Bowel sounds are normal. He exhibits no distension and no mass. There is no tenderness.  Genitourinary: Penis normal. Guaiac negative stool.       Prostate +2 enlarged  Musculoskeletal: Normal range of motion. He exhibits no edema and no tenderness.  Lymphadenopathy:    He has no cervical adenopathy.  Neurological: He is alert. He has normal  reflexes. No cranial nerve deficit. Coordination normal.  Skin: Skin is warm and dry. No rash noted.  Psychiatric: He has a normal mood and affect. His behavior is normal.          Assessment & Plan:   Preventive health examination Coronary artery disease stable Hypertension well controlled Dyslipidemia Inflammatory osteoarthritis. We'll continue present regimen. Recheck in 4 months Impaired glucose tolerance. Stable   Review of Systems      Objective:   Physical Exam  Neck:  Faint right supraclavicular bruit  Genitourinary: Guaiac positive stool.          Assessment & Plan:   As above Laboratory studies reviewed.  Fasting blood sugar today.  Normal  anemia.  Patient has trace hematest positive stool.  Medical regimen does include Celebrex.  Will recheck stool for occult blood and recheck CBC in 1 month

## 2015-07-29 NOTE — Progress Notes (Signed)
Pre visit review using our clinic review tool, if applicable. No additional management support is needed unless otherwise documented below in the visit note. 

## 2015-07-29 NOTE — Patient Instructions (Signed)
Return in one month for follow-up  Take an iron supplement twice daily   Return slides to check stool for hidden blood  Limit your sodium (Salt) intake

## 2015-08-05 ENCOUNTER — Other Ambulatory Visit (INDEPENDENT_AMBULATORY_CARE_PROVIDER_SITE_OTHER): Payer: Medicare Other

## 2015-08-05 DIAGNOSIS — D649 Anemia, unspecified: Secondary | ICD-10-CM

## 2015-08-05 LAB — POC HEMOCCULT BLD/STL (HOME/3-CARD/SCREEN)
Card #2 Fecal Occult Blod, POC: NEGATIVE
Fecal Occult Blood, POC: NEGATIVE

## 2015-08-08 ENCOUNTER — Other Ambulatory Visit: Payer: Self-pay | Admitting: Internal Medicine

## 2015-08-17 ENCOUNTER — Other Ambulatory Visit: Payer: Self-pay | Admitting: Internal Medicine

## 2015-08-26 ENCOUNTER — Ambulatory Visit (INDEPENDENT_AMBULATORY_CARE_PROVIDER_SITE_OTHER): Payer: Medicare Other | Admitting: Internal Medicine

## 2015-08-26 ENCOUNTER — Encounter: Payer: Self-pay | Admitting: Internal Medicine

## 2015-08-26 VITALS — BP 118/78 | HR 87 | Temp 98.8°F | Resp 20 | Ht 70.0 in | Wt 156.0 lb

## 2015-08-26 DIAGNOSIS — I10 Essential (primary) hypertension: Secondary | ICD-10-CM | POA: Diagnosis not present

## 2015-08-26 DIAGNOSIS — K279 Peptic ulcer, site unspecified, unspecified as acute or chronic, without hemorrhage or perforation: Secondary | ICD-10-CM

## 2015-08-26 DIAGNOSIS — D6489 Other specified anemias: Secondary | ICD-10-CM | POA: Diagnosis not present

## 2015-08-26 NOTE — Progress Notes (Signed)
Pre visit review using our clinic review tool, if applicable. No additional management support is needed unless otherwise documented below in the visit note. 

## 2015-08-26 NOTE — Progress Notes (Signed)
Subjective:    Patient ID: Dakota Green, male    DOB: 05-Jun-1933, 81 y.o.   MRN: [id number]  HPI 81 year old patient who is seen today for follow-up of anemia.  He has remote history of peptic ulcer disease and underwent surgery for lumbar spinal stenosis in June.  He had the expected postoperative anemia but was only modestly improved.  1 month ago.  Stool was trace hematest positive.  Further stool examinations have been negative for occult blood.  He generally feels well except for recurrent lumbar pain with radiation down the right leg.  Medical regimen includes low-dose aspirin, Plavix and Celebrex.  He also is on chronic PPI therapy.  Past Medical History  Diagnosis Date  . ALLERGIC RHINITIS 05/04/2007  . CORONARY ARTERY DISEASE 05/04/2007  . DIVERTICULOSIS, COLON 08/14/2009  . ESOPHAGEAL STRICTURE 08/14/2009  . GERD 05/04/2007  . HIATAL HERNIA 08/14/2009  . HYPERLIPIDEMIA 05/04/2007  . HYPERTENSION 05/04/2007  . Osteoarth NOS-Unspec 05/04/2007  . PEPTIC ULCER DISEASE 05/04/2007    bleeding ulcer with hospitalization  . DJD (degenerative joint disease)   . Myocardial infarction 1987  . Torn rotator cuff     right  . Internal hemorrhoids   . BPH (benign prostatic hyperplasia)     Social History   Social History  . Marital Status: Married    Spouse Name: N/A  . Number of Children: N/A  . Years of Education: N/A   Occupational History  . Not on file.   Social History Main Topics  . Smoking status: Former Smoker -- 4.00 packs/day for 35 years    Quit date: 11/29/1985  . Smokeless tobacco: Never Used  . Alcohol Use: 4.2 oz/week    7 Shots of liquor per week     Comment: occ  . Drug Use: No  . Sexual Activity: Not on file   Other Topics Concern  . Not on file   Social History Narrative    Past Surgical History  Procedure Laterality Date  . Cataract extraction Bilateral   . Cardiac catheterization  06/12/2007    EF 45%  . Cardiac catheterization  01/18/2005    EF 50-55%  .  Cardiac catheterization  01/14/2003    EF 55%  . US echocardiography  08/23/2005    EF 50-55%  . Cardiovascular stress test  02/07/2001  . Tonsillectomy    . Shoulder arthroscopy  2013    right  . Shoulder arthroscopy with rotator cuff repair and subacromial decompression Left 05/14/2014    Procedure: LEFT SHOULDER ARTHROSCOPY WITH DEBRIDEMENT EXTENSIVE, DISAL CLAVICULECTOMY, SUBACROMIAL DECOMPRESSION PARTIAL ACROMIOPLASTY WITH CORACOACROMIAL RELEASE AND ROTATOR CUFF REPAIR;  Surgeon: Nilda Simmer, MD;  Location: [facility];  Service: Orthopedics;  Laterality: Left;  . Coronary angioplasty with stent placement      1998  . Coronary angioplasty      1987  . Eye surgery    . Coronary artery bypass graft      X 4  . Back surgery  05/05/2015    Spinal Stenosis, Rod and 2 screws placed in spine    Family History  Problem Relation Age of Onset  . Heart attack Mother   . Heart attack Father   . Prostate cancer Brother   . Colon cancer Neg Hx     Allergies  Allergen Reactions  . Statins Other (See Comments)    Muscle aches  . Crestor [Rosuvastatin Calcium]     Aches     Current Outpatient  Prescriptions on File Prior to Visit  Medication Sig Dispense Refill  . ALPHAGAN P 0.1 % SOLN Place 1 drop into the right eye 2 (two) times daily.     Marland Kitchen aspirin 81 MG tablet Take 81 mg by mouth daily.      . calcium-vitamin D (OSCAL WITH D) 500-200 MG-UNIT per tablet Take 2 tablets by mouth daily with breakfast.    . celecoxib (CELEBREX) 200 MG capsule TAKE 1 CAPSULE BY MOUTH 2 TIMES A DAY 60 capsule 5  . clopidogrel (PLAVIX) 75 MG tablet TAKE 1 TABLET BY MOUTH DAILY 90 tablet 1  . docusate sodium (COLACE) 100 MG capsule Take 1 capsule (100 mg total) by mouth 2 (two) times daily. 60 capsule 0  . ezetimibe (ZETIA) 10 MG tablet Take 1 tablet (10 mg total) by mouth daily. 90 tablet 3  . fish oil-omega-3 fatty acids 1000 MG capsule Take 1,000 mg by mouth 3 (three) times daily.      Marland Kitchen  loratadine (CLARITIN) 10 MG tablet Take 10 mg by mouth daily.      . Multiple Vitamins-Minerals (PRESERVISION/LUTEIN PO) Take by mouth daily.      Marland Kitchen NEXIUM 40 MG capsule Take 1 capsule (40 mg total) by mouth daily before breakfast. 30 capsule 11  . predniSONE (DELTASONE) 5 MG tablet TAKE 1 TABLET BY MOUTH DAILY 90 tablet 1  . sucralfate (CARAFATE) 1 GM/10ML suspension Take 2 teaspoons twice a day 420 mL 1  . tamsulosin (FLOMAX) 0.4 MG CAPS capsule TAKE 1 CAPSULE (0.4 MG TOTAL) BY MOUTH DAILY. (Patient taking differently: TAKE 1 CAPSULE (0.4 MG TOTAL) BY MOUTH twice daily) 90 capsule 3  . temazepam (RESTORIL) 30 MG capsule TAKE ONE CAPSULE BY MOUTH AT BEDTIME 30 capsule 2  . traMADol (ULTRAM) 50 MG tablet Take 1-2 tablets (50-100 mg total) by mouth every 6 (six) hours as needed for moderate pain (1-2 tablets q6hrs prn). 100 tablet 1   No current facility-administered medications on file prior to visit.    BP 118/78 mmHg  Pulse 87  Temp(Src) 98.8 F (37.1 C) (Oral)  Resp 20  Ht  (1.778 m)  Wt 156 lb (70.761 kg)  BMI 22.38 kg/m2  SpO2 97%      Review of Systems  Constitutional: Negative for fever, chills, appetite change and fatigue.  HENT: Negative for congestion, dental problem, ear pain, hearing loss, sore throat, tinnitus, trouble swallowing and voice change.   Eyes: Negative for pain, discharge and visual disturbance.  Respiratory: Negative for cough, chest tightness, wheezing and stridor.   Cardiovascular: Negative for chest pain, palpitations and leg swelling.  Gastrointestinal: Negative for nausea, vomiting, abdominal pain, diarrhea, constipation, blood in stool and abdominal distention.  Genitourinary: Negative for urgency, hematuria, flank pain, discharge, difficulty urinating and genital sores.  Musculoskeletal: Positive for back pain. Negative for myalgias, joint swelling, arthralgias, gait problem and neck stiffness.  Skin: Negative for rash.  Neurological:  Negative for dizziness, syncope, speech difficulty, weakness, numbness and headaches.  Hematological: Negative for adenopathy. Does not bruise/bleed easily.  Psychiatric/Behavioral: Negative for behavioral problems and dysphoric mood. The patient is not nervous/anxious.        Objective:   Physical Exam  Constitutional: He appears well-developed and well-nourished. No distress.  Blood pressure 118/78 Pulse 80          Assessment & Plan:   History of anemia.  Will check a CBC and vitamin B12 level.  Iron supplementation.  Encouraged.  Continue PPI therapy  History of peptic ulcer disease Recurrent low back pain with radicular symptoms  Recheck here 4 months

## 2015-08-26 NOTE — Patient Instructions (Signed)
Take a supplemental iron tablet once daily  Limit your sodium (Salt) intake  Return in 4 months for follow-up

## 2015-08-27 ENCOUNTER — Encounter: Payer: Self-pay | Admitting: Internal Medicine

## 2015-10-06 ENCOUNTER — Telehealth: Payer: Self-pay | Admitting: Cardiovascular Disease

## 2015-10-06 NOTE — Telephone Encounter (Signed)
May hold ASA and plavix for 7 days  Prior to back surgery

## 2015-10-06 NOTE — Telephone Encounter (Signed)
Returned call to Dakota Green with Dr.Jenkins office no answer.Left message on personal voice mail will send message to Dr.Nahser for advice if ok to hold plavix and aspirin.Patient has office visit with Dr.Nahser 10/13/15.

## 2015-10-06 NOTE — Telephone Encounter (Signed)
New message     Request for surgical clearance:  What type of surgery is being performed?  Micro disc surgery 1. When is this surgery scheduled? Pending clearance  Are there any medications that need to be held prior to surgery and how long? Hold plavix and aspirin 2. Name of physician performing surgery? Dr Lovell Sheehan  3. What is your office phone and fax number?  Fax [phone]

## 2015-10-07 NOTE — Telephone Encounter (Signed)
Clearance letter placed in HIM for fax to Dr. Lovell Sheehan' office

## 2015-10-08 ENCOUNTER — Other Ambulatory Visit: Payer: Self-pay | Admitting: Neurosurgery

## 2015-10-13 ENCOUNTER — Encounter: Payer: Self-pay | Admitting: Cardiovascular Disease

## 2015-10-13 ENCOUNTER — Encounter (HOSPITAL_COMMUNITY)
Admission: RE | Admit: 2015-10-13 | Discharge: 2015-10-13 | Disposition: A | Discharge status: Home / Self Care | Payer: Medicare Other | Source: Ambulatory Visit | Attending: Neurosurgery | Admitting: Neurosurgery

## 2015-10-13 ENCOUNTER — Encounter (HOSPITAL_COMMUNITY): Payer: Self-pay

## 2015-10-13 ENCOUNTER — Ambulatory Visit (INDEPENDENT_AMBULATORY_CARE_PROVIDER_SITE_OTHER): Payer: Medicare Other | Admitting: Cardiovascular Disease

## 2015-10-13 VITALS — BP 130/90 | HR 86 | Ht 70.0 in | Wt 153.0 lb

## 2015-10-13 DIAGNOSIS — I2510 Atherosclerotic heart disease of native coronary artery without angina pectoris: Secondary | ICD-10-CM | POA: Diagnosis not present

## 2015-10-13 DIAGNOSIS — I10 Essential (primary) hypertension: Secondary | ICD-10-CM | POA: Diagnosis not present

## 2015-10-13 DIAGNOSIS — Z7982 Long term (current) use of aspirin: Secondary | ICD-10-CM | POA: Diagnosis not present

## 2015-10-13 DIAGNOSIS — M5416 Radiculopathy, lumbar region: Secondary | ICD-10-CM | POA: Diagnosis not present

## 2015-10-13 DIAGNOSIS — Z888 Allergy status to other drugs, medicaments and biological substances status: Secondary | ICD-10-CM | POA: Diagnosis not present

## 2015-10-13 DIAGNOSIS — K219 Gastro-esophageal reflux disease without esophagitis: Secondary | ICD-10-CM | POA: Diagnosis not present

## 2015-10-13 DIAGNOSIS — Z87891 Personal history of nicotine dependence: Secondary | ICD-10-CM | POA: Diagnosis not present

## 2015-10-13 DIAGNOSIS — M5126 Other intervertebral disc displacement, lumbar region: Secondary | ICD-10-CM | POA: Diagnosis not present

## 2015-10-13 DIAGNOSIS — I252 Old myocardial infarction: Secondary | ICD-10-CM | POA: Diagnosis not present

## 2015-10-13 DIAGNOSIS — Z7902 Long term (current) use of antithrombotics/antiplatelets: Secondary | ICD-10-CM | POA: Diagnosis not present

## 2015-10-13 DIAGNOSIS — E785 Hyperlipidemia, unspecified: Secondary | ICD-10-CM | POA: Diagnosis not present

## 2015-10-13 DIAGNOSIS — M1990 Unspecified osteoarthritis, unspecified site: Secondary | ICD-10-CM | POA: Diagnosis not present

## 2015-10-13 DIAGNOSIS — N400 Enlarged prostate without lower urinary tract symptoms: Secondary | ICD-10-CM | POA: Diagnosis not present

## 2015-10-13 DIAGNOSIS — Z91040 Latex allergy status: Secondary | ICD-10-CM | POA: Diagnosis not present

## 2015-10-13 HISTORY — DX: Spontaneous ecchymoses: R23.3

## 2015-10-13 HISTORY — DX: Chronic or unspecified peptic ulcer, site unspecified, with hemorrhage: K27.4

## 2015-10-13 HISTORY — DX: Other skin changes: R23.8

## 2015-10-13 LAB — CBC
HCT: 39.4 % (ref 39.0–52.0)
HEMOGLOBIN: 12.5 g/dL — AB (ref 13.0–17.0)
MCH: 28.9 pg (ref 26.0–34.0)
MCHC: 31.7 g/dL (ref 30.0–36.0)
MCV: 91 fL (ref 78.0–100.0)
PLATELETS: 216 10*3/uL (ref 150–400)
RBC: 4.33 MIL/uL (ref 4.22–5.81)
RDW: 18.7 % — ABNORMAL HIGH (ref 11.5–15.5)
WBC: 6.8 10*3/uL (ref 4.0–10.5)

## 2015-10-13 LAB — BASIC METABOLIC PANEL
Anion gap: 8 (ref 5–15)
BUN: 8 mg/dL (ref 6–20)
CHLORIDE: 102 mmol/L (ref 101–111)
CO2: 29 mmol/L (ref 22–32)
CREATININE: 0.97 mg/dL (ref 0.61–1.24)
Calcium: 9.7 mg/dL (ref 8.9–10.3)
GFR calc Af Amer: >60 mL/min (ref >60)
GFR calc non Af Amer: >60 mL/min (ref >60)
GLUCOSE: 154 mg/dL — AB (ref 65–99)
Potassium: 4.7 mmol/L (ref 3.5–5.1)
SODIUM: 139 mmol/L (ref 135–145)

## 2015-10-13 LAB — SURGICAL PCR SCREEN
MRSA, PCR: NEGATIVE
Staphylococcus aureus: NEGATIVE

## 2015-10-13 NOTE — Pre-Procedure Instructions (Signed)
Dakota Green  10/13/2015     Your procedure is scheduled on : Wednesday October 15, 2015.  Report to [facility] Admitting at 12:30 PM.  Call this number if you have problems the morning of surgery: [phone]    Remember:  Do not eat food or drink liquids after midnight.  Take these medicines the morning of surgery with A SIP OF WATER : Gabapentin (Neurontin), Loratadine (Claritin), Nexium, Tamsulosin (Flomax), Tramadol (Ultram) if needed, Prednisone, Eye drops   Please stop taking any aspirin, Plavix, Fish Oil, vitamins, herbal medications, etc   Do not wear jewelry.  Do not wear lotions, powders, or cologne.    Men may shave face and neck.  Do not bring valuables to the hospital.  [facility] is not responsible for any belongings or valuables.  Contacts, dentures or bridgework may not be worn into surgery.  Leave your suitcase in the car.  After surgery it may be brought to your room.  For patients admitted to the hospital, discharge time will be determined by your treatment team.  Patients discharged the day of surgery will not be allowed to drive home.   Name and phone number of your driver:    Special instructions:  Shower using CHG soap the night before and the morning of your surgery  Please read over the following fact sheets that you were given. Pain Booklet, Coughing and Deep Breathing, MRSA Information and Surgical Site Infection Prevention

## 2015-10-13 NOTE — Patient Instructions (Signed)

## 2015-10-13 NOTE — Progress Notes (Signed)
PCP is Thyra Breed  Cardiologist is Kristeen Miss. LOV was today 10/13/15.  Patient denied having any acute cardiac or pulmonary issues  Patient wife at chairside during PAT visit  Patient informed Nurse that he stopped taking Plavix and aspirin on Monday 10/06/15.

## 2015-10-13 NOTE — Progress Notes (Signed)
Dakota Green Date of Birth  05-Jun-1933 [facility] [address]    [address] [place], [place]  [address] [phone]  Fax  [phone]   Problem List: 1. CAD, status post coronary artery bypass grafting in 1999,  status post PCI in 2008 2. Right shoulder surgery 3  Hyperlipidemia 4. Arthritis 5. hypertension  History of Present Illness:  Dakota Green is a 81 year old gentleman with a history of coronary artery disease. He has a history of hyperlipidemia. He is intolerant to all statin medications.  He is having lots of problems with arthritis.     Dakota Green fell and injured his right arm about 4 months ago. He's recently had surgery to repair torn tendons in the shoulder. He's doing very well from a cardiac standpoint. He's not had any episodes of chest pain or shortness of breath.   He has not had any cardiac complications since that time.  Nov. 12, 2014:  Dakota Green is doing well.  Staying busy .  Exercising regularly.  Working hard [place] his [place] in [employer].   [place]    He has been having lots of problems with osteoarthritis.  He is on prednisone and celebrex and is doing well.   He has tired multiple statins and has not found one that he could take.  His last cholesterol levels are elevated.   Nov. 13, 2015:  Dakota Green is doing well.  He is going for an epidural injection next week. No CP or dyspnea.  Has some bruising on his arms - tried holding his ASA and plavix for several days - is back on them now.   He has lost some weight and his BP has been low. He has been off his Benazapril for several months and his blood pressure remains normal.    102/66 today.  Nov. 14, 2016:  Doing well.  Having more back surgery this Thursday  . Is holding the plavix for now.   No CP or dyspnea.    Current Outpatient Prescriptions on File Prior to Visit  Medication Sig Dispense Refill  . ALPHAGAN P 0.1 % SOLN Place 1 drop into the right eye 2 (two) times daily.     Marland Kitchen  aspirin 81 MG tablet Take 81 mg by mouth daily.      . calcium-vitamin D (OSCAL WITH D) 500-200 MG-UNIT per tablet Take 2 tablets by mouth daily with breakfast.    . celecoxib (CELEBREX) 200 MG capsule TAKE 1 CAPSULE BY MOUTH 2 TIMES A DAY 60 capsule 5  . clopidogrel (PLAVIX) 75 MG tablet TAKE 1 TABLET BY MOUTH DAILY 90 tablet 1  . docusate sodium (COLACE) 100 MG capsule Take 1 capsule (100 mg total) by mouth 2 (two) times daily. 60 capsule 0  . ezetimibe (ZETIA) 10 MG tablet Take 1 tablet (10 mg total) by mouth daily. 90 tablet 3  . gabapentin (NEURONTIN) 100 MG capsule TAKE 3 CAPSULES BY MOUTH 3 TIMES A DAY  1  . IRON PO Take 1 tablet by mouth daily.    Marland Kitchen loratadine (CLARITIN) 10 MG tablet Take 10 mg by mouth daily.      . Multiple Vitamins-Minerals (PRESERVISION/LUTEIN PO) Take 1 tablet by mouth 2 (two) times daily.     Marland Kitchen NEXIUM 40 MG capsule Take 1 capsule (40 mg total) by mouth daily before breakfast. 30 capsule 11  . predniSONE (DELTASONE) 5 MG tablet TAKE 1 TABLET BY MOUTH DAILY 90 tablet 1  .  tamsulosin (FLOMAX) 0.4 MG CAPS capsule Take 0.4 mg by mouth 2 (two) times daily.    . temazepam (RESTORIL) 30 MG capsule TAKE ONE CAPSULE BY MOUTH AT BEDTIME (Patient taking differently: TAKE ONE CAPSULE BY MOUTH AT BEDTIME FOR SLEEP) 30 capsule 2  . traMADol (ULTRAM) 50 MG tablet Take 1-2 tablets (50-100 mg total) by mouth every 6 (six) hours as needed for moderate pain (1-2 tablets q6hrs prn). 100 tablet 1   No current facility-administered medications on file prior to visit.    Allergies  Allergen Reactions  . Statins Other (See Comments)    Muscle aches  . Crestor [Rosuvastatin Calcium]     Aches   . Latex Other (See Comments)    Makes skin red & causes irritation    Past Medical History  Diagnosis Date  . ALLERGIC RHINITIS 05/04/2007  . CORONARY ARTERY DISEASE 05/04/2007  . DIVERTICULOSIS, COLON 08/14/2009  . ESOPHAGEAL STRICTURE 08/14/2009  . GERD 05/04/2007  . HIATAL HERNIA 08/14/2009   . HYPERLIPIDEMIA 05/04/2007  . HYPERTENSION 05/04/2007  . Osteoarth NOS-Unspec 05/04/2007  . PEPTIC ULCER DISEASE 05/04/2007    bleeding ulcer with hospitalization  . DJD (degenerative joint disease)   . Myocardial infarction (HCC) 1987  . Torn rotator cuff     right  . Internal hemorrhoids   . BPH (benign prostatic hyperplasia)     Past Surgical History  Procedure Laterality Date  . Cataract extraction Bilateral   . Cardiac catheterization  06/12/2007    EF 45%  . Cardiac catheterization  01/18/2005    EF 50-55%  . Cardiac catheterization  01/14/2003    EF 55%  . US echocardiography  08/23/2005    EF 50-55%  . Cardiovascular stress test  02/07/2001  . Tonsillectomy    . Shoulder arthroscopy  2013    right  . Shoulder arthroscopy with rotator cuff repair and subacromial decompression Left 05/14/2014    Procedure: LEFT SHOULDER ARTHROSCOPY WITH DEBRIDEMENT EXTENSIVE, DISAL CLAVICULECTOMY, SUBACROMIAL DECOMPRESSION PARTIAL ACROMIOPLASTY WITH CORACOACROMIAL RELEASE AND ROTATOR CUFF REPAIR;  Surgeon: Nilda Simmer, MD;  Location: [facility];  Service: Orthopedics;  Laterality: Left;  . Coronary angioplasty with stent placement      1998  . Coronary angioplasty      1987  . Eye surgery    . Coronary artery bypass graft      X 4  . Back surgery  05/05/2015    Spinal Stenosis, Rod and 2 screws placed in spine    History  Smoking status  . Former Smoker -- 4.00 packs/day for 35 years  . Quit date: 11/29/1985  Smokeless tobacco  . Never Used    History  Alcohol Use  . 4.2 oz/week  . 7 Shots of liquor per week    Comment: occ    Family History  Problem Relation Age of Onset  . Heart attack Mother   . Heart attack Father   . Prostate cancer Brother   . Colon cancer Neg Hx     Reviw of Systems:  Reviewed in the HPI.  All other systems are negative.  Physical Exam: BP 130/90 mmHg  Pulse 86  Ht  (1.778 m)  Wt 153 lb (69.4 kg)  BMI 21.95 kg/m2  SpO2  91%  The patient is alert and oriented x 3.  The mood and affect are normal.   Skin: warm and dry.  Color is normal.    HEENT:   the sclera are nonicteric.  The mucous membranes are moist.  The carotids are 2+ without bruits.  There is no thyromegaly.  There is no JVD.    Lungs: clear.  The chest wall is non tender.    Heart: regular rate with a normal S1 and S2.  There are no murmurs, gallops, or rubs. The PMI is not displaced.     Abdomen: good bowel sounds.  There is no guarding or rebound.  There is no hepatosplenomegaly or tenderness.  There are no masses.   Extremities:  no clubbing, cyanosis, or edema.  The legs are without rashes.  The distal pulses are intact.   Neuro:  Cranial nerves II - XII are intact.  Motor and sensory functions are intact.    The gait is normal.  ECG:  Assessment / Plan:   1. CAD, status post coronary artery bypass grafting in 1999,  status post PCI in 2008. Doing well. Continue current medications. His Plavix is currently on hold because of back surgery this week.  2. Right shoulder surgery 3  Hyperlipidemia - has improved with diet .  4. Arthritis 5. Hypertension - BP is well controlled.      Dakota Green, Deloris Ping, MD  10/13/2015 10:56 AM    [facility] [address] [place],  [address] [place], [place]  [address] Pager [phone] Phone: [phone]; Fax: [phone]   [facility]  [address] [place], [place]  [address] [phone]   Fax [phone]

## 2015-10-14 NOTE — Progress Notes (Signed)
Anesthesia Chart Review: Pt is 81 year old male scheduled for right L3-4 microdiskectomy on 10/15/15 by Dr. Lovell Sheehan. He is s/p L3-4 and L4-5 laminectomy, L4-5 PLIF on 05/05/2015.   PMH includes: CAD (s/p CABG 1999, s/p stenting to RCA '08), HTN (no meds), MI (1987), hyperlipidemia (intolerant to statins), GERD, hiatal hernia, PUD, bruises easily. Former smoker. BMI 21. S/p L shoulder arthroscopy 05/14/14 and s/p R shoulder arthroscopy 08/29/12. PCP is Dr. Amador Cunas.  Cardiologist is Dr. Elease Hashimoto, last visit 10/13/15. He is aware of surgery plans.   Medications include: ASA 81 mg, Plavix, Zetia, Neurontin, Iron, Nexium, Flomax, Restoril, tramadol. Plavix is on hold for surgery.    Preoperative labs reviewed.   Chest x-ray 04/30/2015 reviewed. No acute cardiopulmonary findings.   EKG 04/30/2015: Sinus rhythm with occasional PVCs. Left anterior fascicular block.   Cardiac cath 06/12/2007: -Severe native coronary artery disease with patentsaphenous vein grafts to the right coronary artery, to the obtuse marginal artery and a patent left internal mammary artery to the left anterior descending.  -distal RCA stent with moderate in-stent restenosis of about 40-50% -We will continue with medical therapy  Echo 08/23/2005: -LV systolic function is at lower limits of normal. There is mild LVH with impaired relaxation.  -mild mitral and mild tricuspid regurgitation -trivial aortic insufficiency.   Patient with recent cardiology evaluation. Dr. Elease Hashimoto is aware of surgery plans. If no changes, I anticipate pt can proceed with surgery as scheduled. BP was elevated at PAT--but to a lesser extent at his visit at [facility] (130/90). Vitals on arrival.  Velna Ochs [facility] Short Stay [facility]/Anesthesiology Phone [phone] 10/14/2015 12:13 PM

## 2015-10-15 ENCOUNTER — Encounter (HOSPITAL_COMMUNITY)
Admission: RE | Disposition: A | Discharge status: Home / Self Care | Payer: Self-pay | Source: Ambulatory Visit | Attending: Neurosurgery

## 2015-10-15 ENCOUNTER — Ambulatory Visit (HOSPITAL_COMMUNITY): Payer: Medicare Other | Admitting: Vascular Surgery

## 2015-10-15 ENCOUNTER — Ambulatory Visit (HOSPITAL_COMMUNITY)
Admission: RE | Admit: 2015-10-15 | Discharge: 2015-10-17 | Disposition: A | Discharge status: Home / Self Care | Payer: Medicare Other | Source: Ambulatory Visit | Attending: Neurosurgery | Admitting: Neurosurgery

## 2015-10-15 ENCOUNTER — Ambulatory Visit (HOSPITAL_COMMUNITY): Payer: Medicare Other | Admitting: Anesthesiology

## 2015-10-15 ENCOUNTER — Encounter (HOSPITAL_COMMUNITY): Payer: Self-pay | Admitting: Anesthesiology

## 2015-10-15 ENCOUNTER — Ambulatory Visit (HOSPITAL_COMMUNITY): Payer: Medicare Other

## 2015-10-15 DIAGNOSIS — I2510 Atherosclerotic heart disease of native coronary artery without angina pectoris: Secondary | ICD-10-CM | POA: Insufficient documentation

## 2015-10-15 DIAGNOSIS — K219 Gastro-esophageal reflux disease without esophagitis: Secondary | ICD-10-CM | POA: Insufficient documentation

## 2015-10-15 DIAGNOSIS — M1990 Unspecified osteoarthritis, unspecified site: Secondary | ICD-10-CM | POA: Insufficient documentation

## 2015-10-15 DIAGNOSIS — M5126 Other intervertebral disc displacement, lumbar region: Secondary | ICD-10-CM | POA: Insufficient documentation

## 2015-10-15 DIAGNOSIS — M5416 Radiculopathy, lumbar region: Secondary | ICD-10-CM | POA: Diagnosis not present

## 2015-10-15 DIAGNOSIS — Z87891 Personal history of nicotine dependence: Secondary | ICD-10-CM | POA: Insufficient documentation

## 2015-10-15 DIAGNOSIS — E785 Hyperlipidemia, unspecified: Secondary | ICD-10-CM | POA: Insufficient documentation

## 2015-10-15 DIAGNOSIS — Z7902 Long term (current) use of antithrombotics/antiplatelets: Secondary | ICD-10-CM | POA: Insufficient documentation

## 2015-10-15 DIAGNOSIS — Z419 Encounter for procedure for purposes other than remedying health state, unspecified: Secondary | ICD-10-CM

## 2015-10-15 DIAGNOSIS — Z888 Allergy status to other drugs, medicaments and biological substances status: Secondary | ICD-10-CM | POA: Insufficient documentation

## 2015-10-15 DIAGNOSIS — N400 Enlarged prostate without lower urinary tract symptoms: Secondary | ICD-10-CM | POA: Insufficient documentation

## 2015-10-15 DIAGNOSIS — Z91040 Latex allergy status: Secondary | ICD-10-CM | POA: Insufficient documentation

## 2015-10-15 DIAGNOSIS — I10 Essential (primary) hypertension: Secondary | ICD-10-CM | POA: Insufficient documentation

## 2015-10-15 DIAGNOSIS — Z7982 Long term (current) use of aspirin: Secondary | ICD-10-CM | POA: Insufficient documentation

## 2015-10-15 DIAGNOSIS — I252 Old myocardial infarction: Secondary | ICD-10-CM | POA: Insufficient documentation

## 2015-10-15 HISTORY — PX: LUMBAR LAMINECTOMY/DECOMPRESSION MICRODISCECTOMY: SHX5026

## 2015-10-15 SURGERY — LUMBAR LAMINECTOMY/DECOMPRESSION MICRODISCECTOMY 1 LEVEL
Anesthesia: General | Site: Spine Lumbar | Laterality: Right

## 2015-10-15 MED ORDER — EZETIMIBE 10 MG PO TABS
10.0000 mg | ORAL_TABLET | Freq: Every day | ORAL | Status: DC
  Administered 2015-10-16: 10 mg via ORAL
  Filled 2015-10-15: qty 1

## 2015-10-15 MED ORDER — LACTATED RINGERS IV SOLN: INTRAVENOUS | Status: DC

## 2015-10-15 MED ORDER — LACTATED RINGERS IV SOLN
INTRAVENOUS | Status: DC | PRN
  Administered 2015-10-15 (×2): via INTRAVENOUS

## 2015-10-15 MED ORDER — PROPOFOL 10 MG/ML IV BOLUS
INTRAVENOUS | Status: DC | PRN
  Administered 2015-10-15: 160 mg via INTRAVENOUS

## 2015-10-15 MED ORDER — TRAMADOL HCL 50 MG PO TABS: 50.0000 mg | ORAL_TABLET | Freq: Four times a day (QID) | ORAL | Status: DC | PRN

## 2015-10-15 MED ORDER — HEMOSTATIC AGENTS (NO CHARGE) OPTIME
TOPICAL | Status: DC | PRN
  Administered 2015-10-15: 1 via TOPICAL

## 2015-10-15 MED ORDER — BUPIVACAINE-EPINEPHRINE (PF) 0.5% -1:200000 IJ SOLN
INTRAMUSCULAR | Status: DC | PRN
  Administered 2015-10-15 (×2): 10 mL

## 2015-10-15 MED ORDER — BACITRACIN 50000 UNITS IM SOLR
INTRAMUSCULAR | Status: DC | PRN
  Administered 2015-10-15: 17:00:00

## 2015-10-15 MED ORDER — PREDNISONE 5 MG PO TABS
5.0000 mg | ORAL_TABLET | Freq: Every day | ORAL | Status: DC
  Administered 2015-10-16: 5 mg via ORAL
  Filled 2015-10-15: qty 1

## 2015-10-15 MED ORDER — PROMETHAZINE HCL 25 MG/ML IJ SOLN: 6.2500 mg | INTRAMUSCULAR | Status: DC | PRN

## 2015-10-15 MED ORDER — ACETAMINOPHEN 650 MG RE SUPP: 650.0000 mg | RECTAL | Status: DC | PRN

## 2015-10-15 MED ORDER — FENTANYL CITRATE (PF) 100 MCG/2ML IJ SOLN
100.0000 ug | Freq: Once | INTRAMUSCULAR | Status: AC
  Administered 2015-10-15: 100 ug via INTRAVENOUS

## 2015-10-15 MED ORDER — TAMSULOSIN HCL 0.4 MG PO CAPS
0.4000 mg | ORAL_CAPSULE | Freq: Two times a day (BID) | ORAL | Status: DC
  Administered 2015-10-15 – 2015-10-16 (×3): 0.4 mg via ORAL
  Filled 2015-10-15 (×3): qty 1

## 2015-10-15 MED ORDER — 0.9 % SODIUM CHLORIDE (POUR BTL) OPTIME
TOPICAL | Status: DC | PRN
  Administered 2015-10-15: 1000 mL

## 2015-10-15 MED ORDER — LACTATED RINGERS IV SOLN
INTRAVENOUS | Status: DC
  Administered 2015-10-15: 13:00:00 via INTRAVENOUS

## 2015-10-15 MED ORDER — PHENYLEPHRINE HCL 10 MG/ML IJ SOLN
INTRAMUSCULAR | Status: DC | PRN
  Administered 2015-10-15 (×3): 80 ug via INTRAVENOUS

## 2015-10-15 MED ORDER — CEFAZOLIN SODIUM-DEXTROSE 2-3 GM-% IV SOLR
2.0000 g | INTRAVENOUS | Status: AC
  Administered 2015-10-15: 2 g via INTRAVENOUS
  Filled 2015-10-15: qty 50

## 2015-10-15 MED ORDER — FENTANYL CITRATE (PF) 100 MCG/2ML IJ SOLN
INTRAMUSCULAR | Status: AC
  Administered 2015-10-15: 100 ug via INTRAVENOUS
  Filled 2015-10-15: qty 2

## 2015-10-15 MED ORDER — PHENOL 1.4 % MT LIQD: 1.0000 | OROMUCOSAL | Status: DC | PRN

## 2015-10-15 MED ORDER — HYDROMORPHONE HCL 1 MG/ML IJ SOLN
INTRAMUSCULAR | Status: AC
  Filled 2015-10-15: qty 1

## 2015-10-15 MED ORDER — BISACODYL 10 MG RE SUPP: 10.0000 mg | Freq: Every day | RECTAL | Status: DC | PRN

## 2015-10-15 MED ORDER — SUGAMMADEX SODIUM 200 MG/2ML IV SOLN
INTRAVENOUS | Status: DC | PRN
  Administered 2015-10-15: 135.4 mg via INTRAVENOUS

## 2015-10-15 MED ORDER — GABAPENTIN 100 MG PO CAPS
100.0000 mg | ORAL_CAPSULE | Freq: Three times a day (TID) | ORAL | Status: DC
  Administered 2015-10-15 – 2015-10-16 (×2): 100 mg via ORAL
  Filled 2015-10-15 (×2): qty 1

## 2015-10-15 MED ORDER — HYDROMORPHONE HCL 1 MG/ML IJ SOLN
0.2500 mg | INTRAMUSCULAR | Status: DC | PRN
  Administered 2015-10-15 (×2): 0.5 mg via INTRAVENOUS

## 2015-10-15 MED ORDER — CEFAZOLIN SODIUM-DEXTROSE 2-3 GM-% IV SOLR
2.0000 g | Freq: Three times a day (TID) | INTRAVENOUS | Status: AC
  Administered 2015-10-16 (×2): 2 g via INTRAVENOUS
  Filled 2015-10-15 (×3): qty 50

## 2015-10-15 MED ORDER — BRIMONIDINE TARTRATE 0.2 % OP SOLN
1.0000 [drp] | Freq: Three times a day (TID) | OPHTHALMIC | Status: DC
  Administered 2015-10-16 (×3): 1 [drp] via OPHTHALMIC

## 2015-10-15 MED ORDER — TEMAZEPAM 15 MG PO CAPS
30.0000 mg | ORAL_CAPSULE | Freq: Every day | ORAL | Status: DC
  Administered 2015-10-15 – 2015-10-16 (×2): 30 mg via ORAL
  Filled 2015-10-15 (×2): qty 2

## 2015-10-15 MED ORDER — LIDOCAINE HCL (CARDIAC) 20 MG/ML IV SOLN
INTRAVENOUS | Status: DC | PRN
  Administered 2015-10-15: 50 mg via INTRAVENOUS

## 2015-10-15 MED ORDER — SODIUM CHLORIDE 0.9 % IJ SOLN
INTRAMUSCULAR | Status: AC
  Filled 2015-10-15: qty 10

## 2015-10-15 MED ORDER — HYDROCODONE-ACETAMINOPHEN 5-325 MG PO TABS
1.0000 | ORAL_TABLET | ORAL | Status: DC | PRN
  Administered 2015-10-15: 1 via ORAL

## 2015-10-15 MED ORDER — DOCUSATE SODIUM 100 MG PO CAPS: 100.0000 mg | ORAL_CAPSULE | Freq: Two times a day (BID) | ORAL | Status: DC

## 2015-10-15 MED ORDER — DOCUSATE SODIUM 100 MG PO CAPS
100.0000 mg | ORAL_CAPSULE | Freq: Two times a day (BID) | ORAL | Status: DC
Start: 2015-10-15 — End: 2015-10-17
  Administered 2015-10-15 – 2015-10-16 (×3): 100 mg via ORAL
  Filled 2015-10-15 (×3): qty 1

## 2015-10-15 MED ORDER — ONDANSETRON HCL 4 MG/2ML IJ SOLN
4.0000 mg | INTRAMUSCULAR | Status: DC | PRN
Start: 2015-10-15 — End: 2015-10-17

## 2015-10-15 MED ORDER — DIAZEPAM 5 MG PO TABS: 5.0000 mg | ORAL_TABLET | Freq: Four times a day (QID) | ORAL | Status: DC | PRN

## 2015-10-15 MED ORDER — PANTOPRAZOLE SODIUM 40 MG PO TBEC
80.0000 mg | DELAYED_RELEASE_TABLET | Freq: Every day | ORAL | Status: DC
  Administered 2015-10-16: 80 mg via ORAL
  Filled 2015-10-15: qty 2

## 2015-10-15 MED ORDER — ROCURONIUM BROMIDE 100 MG/10ML IV SOLN
INTRAVENOUS | Status: DC | PRN
  Administered 2015-10-15: 40 mg via INTRAVENOUS
  Administered 2015-10-15: 10 mg via INTRAVENOUS

## 2015-10-15 MED ORDER — CELECOXIB 100 MG PO CAPS
100.0000 mg | ORAL_CAPSULE | Freq: Two times a day (BID) | ORAL | Status: DC
  Administered 2015-10-15 – 2015-10-16 (×3): 100 mg via ORAL
  Filled 2015-10-15 (×5): qty 1

## 2015-10-15 MED ORDER — ALUM & MAG HYDROXIDE-SIMETH 200-200-20 MG/5ML PO SUSP: 30.0000 mL | Freq: Four times a day (QID) | ORAL | Status: DC | PRN

## 2015-10-15 MED ORDER — OXYCODONE-ACETAMINOPHEN 5-325 MG PO TABS
1.0000 | ORAL_TABLET | ORAL | Status: DC | PRN
  Administered 2015-10-15 – 2015-10-16 (×2): 2 via ORAL
  Administered 2015-10-16: 1 via ORAL
  Administered 2015-10-16 (×2): 2 via ORAL
  Administered 2015-10-16: 1 via ORAL
  Administered 2015-10-16 – 2015-10-17 (×3): 2 via ORAL
  Filled 2015-10-15 (×8): qty 2

## 2015-10-15 MED ORDER — PHENYLEPHRINE 40 MCG/ML (10ML) SYRINGE FOR IV PUSH (FOR BLOOD PRESSURE SUPPORT)
PREFILLED_SYRINGE | INTRAVENOUS | Status: AC
  Filled 2015-10-15: qty 10

## 2015-10-15 MED ORDER — MORPHINE SULFATE (PF) 2 MG/ML IV SOLN
1.0000 mg | INTRAVENOUS | Status: DC | PRN
  Administered 2015-10-16: 2 mg via INTRAVENOUS
  Filled 2015-10-15 (×2): qty 1

## 2015-10-15 MED ORDER — LORATADINE 10 MG PO TABS
10.0000 mg | ORAL_TABLET | Freq: Every day | ORAL | Status: DC
  Administered 2015-10-16: 10 mg via ORAL
  Filled 2015-10-15: qty 1

## 2015-10-15 MED ORDER — ACETAMINOPHEN 325 MG PO TABS: 650.0000 mg | ORAL_TABLET | ORAL | Status: DC | PRN

## 2015-10-15 MED ORDER — HYDROCODONE-ACETAMINOPHEN 5-325 MG PO TABS
ORAL_TABLET | ORAL | Status: AC
  Filled 2015-10-15: qty 1

## 2015-10-15 MED ORDER — BACITRACIN ZINC 500 UNIT/GM EX OINT
TOPICAL_OINTMENT | CUTANEOUS | Status: DC | PRN
  Administered 2015-10-15: 1 via TOPICAL

## 2015-10-15 MED ORDER — PROPOFOL 10 MG/ML IV BOLUS
INTRAVENOUS | Status: AC
  Filled 2015-10-15: qty 20

## 2015-10-15 MED ORDER — MENTHOL 3 MG MT LOZG: 1.0000 | LOZENGE | OROMUCOSAL | Status: DC | PRN

## 2015-10-15 MED ORDER — BRIMONIDINE TARTRATE 0.2 % OP SOLN
1.0000 [drp] | Freq: Three times a day (TID) | OPHTHALMIC | Status: DC
  Administered 2015-10-15: 1 [drp] via OPHTHALMIC
  Filled 2015-10-15: qty 5

## 2015-10-15 MED ORDER — MEPERIDINE HCL 25 MG/ML IJ SOLN: 6.2500 mg | INTRAMUSCULAR | Status: DC | PRN

## 2015-10-15 MED ORDER — FENTANYL CITRATE (PF) 100 MCG/2ML IJ SOLN
INTRAMUSCULAR | Status: DC | PRN
  Administered 2015-10-15 (×2): 50 ug via INTRAVENOUS
  Administered 2015-10-15: 100 ug via INTRAVENOUS
  Administered 2015-10-15: 50 ug via INTRAVENOUS

## 2015-10-15 MED ORDER — SUGAMMADEX SODIUM 500 MG/5ML IV SOLN
INTRAVENOUS | Status: AC
  Filled 2015-10-15: qty 5

## 2015-10-15 MED ORDER — FENTANYL CITRATE (PF) 250 MCG/5ML IJ SOLN
INTRAMUSCULAR | Status: AC
  Filled 2015-10-15: qty 5

## 2015-10-15 MED ORDER — THROMBIN 5000 UNITS EX SOLR
CUTANEOUS | Status: DC | PRN
  Administered 2015-10-15 (×2): 5000 [IU] via TOPICAL

## 2015-10-15 SURGICAL SUPPLY — 45 items
APL SKNCLS STERI-STRIP NONHPOA (GAUZE/BANDAGES/DRESSINGS) ×1
BAG DECANTER FOR FLEXI CONT (MISCELLANEOUS) ×3 IMPLANT
BENZOIN TINCTURE PRP APPL 2/3 (GAUZE/BANDAGES/DRESSINGS) ×3 IMPLANT
BLADE CLIPPER SURG (BLADE) IMPLANT
BRUSH SCRUB EZ PLAIN DRY (MISCELLANEOUS) ×3 IMPLANT
BUR MATCHSTICK NEURO 3.0 LAGG (BURR) ×3 IMPLANT
BUR PRECISION FLUTE 6.0 (BURR) ×3 IMPLANT
CANISTER SUCT 3000ML PPV (MISCELLANEOUS) ×3 IMPLANT
CLOSURE WOUND 1/2 X4 (GAUZE/BANDAGES/DRESSINGS) ×1
DRAPE LAPAROTOMY 100X72X124 (DRAPES) ×3 IMPLANT
DRAPE MICROSCOPE LEICA (MISCELLANEOUS) ×3 IMPLANT
DRAPE POUCH INSTRU U-SHP 10X18 (DRAPES) ×3 IMPLANT
DRAPE SURG 17X23 STRL (DRAPES) ×12 IMPLANT
ELECT BLADE 4.0 EZ CLEAN MEGAD (MISCELLANEOUS) ×3
ELECT REM PT RETURN 9FT ADLT (ELECTROSURGICAL) ×3
ELECTRODE BLDE 4.0 EZ CLN MEGD (MISCELLANEOUS) ×1 IMPLANT
ELECTRODE REM PT RTRN 9FT ADLT (ELECTROSURGICAL) ×1 IMPLANT
GAUZE SPONGE 4X4 12PLY STRL (GAUZE/BANDAGES/DRESSINGS) ×3 IMPLANT
GAUZE SPONGE 4X4 16PLY XRAY LF (GAUZE/BANDAGES/DRESSINGS) IMPLANT
GLOVE BIO SURGEON STRL SZ8 (GLOVE) ×1 IMPLANT
GLOVE BIO SURGEON STRL SZ8.5 (GLOVE) ×1 IMPLANT
GOWN STRL REUS W/ TWL LRG LVL3 (GOWN DISPOSABLE) IMPLANT
GOWN STRL REUS W/ TWL XL LVL3 (GOWN DISPOSABLE) ×1 IMPLANT
GOWN STRL REUS W/TWL 2XL LVL3 (GOWN DISPOSABLE) IMPLANT
GOWN STRL REUS W/TWL LRG LVL3 (GOWN DISPOSABLE) ×6
GOWN STRL REUS W/TWL XL LVL3 (GOWN DISPOSABLE) ×3
KIT BASIN OR (CUSTOM PROCEDURE TRAY) ×3 IMPLANT
KIT ROOM TURNOVER OR (KITS) ×3 IMPLANT
NDL HYPO 21X1.5 SAFETY (NEEDLE) IMPLANT
NEEDLE HYPO 21X1.5 SAFETY (NEEDLE) IMPLANT
NEEDLE HYPO 22GX1.5 SAFETY (NEEDLE) ×3 IMPLANT
NS IRRIG 1000ML POUR BTL (IV SOLUTION) ×3 IMPLANT
PACK LAMINECTOMY NEURO (CUSTOM PROCEDURE TRAY) ×3 IMPLANT
PAD ARMBOARD 7.5X6 YLW CONV (MISCELLANEOUS) ×9 IMPLANT
PATTIES SURGICAL .5 X1 (DISPOSABLE) IMPLANT
RUBBERBAND STERILE (MISCELLANEOUS) ×6 IMPLANT
SPONGE SURGIFOAM ABS GEL SZ50 (HEMOSTASIS) ×3 IMPLANT
STRIP CLOSURE SKIN 1/2X4 (GAUZE/BANDAGES/DRESSINGS) ×2 IMPLANT
SUT VIC AB 1 CT1 18XBRD ANBCTR (SUTURE) ×1 IMPLANT
SUT VIC AB 1 CT1 8-18 (SUTURE) ×3
SUT VIC AB 2-0 CP2 18 (SUTURE) ×3 IMPLANT
TAPE CLOTH SURG 4X10 WHT LF (GAUZE/BANDAGES/DRESSINGS) ×2 IMPLANT
TOWEL OR 17X24 6PK STRL BLUE (TOWEL DISPOSABLE) ×3 IMPLANT
TOWEL OR 17X26 10 PK STRL BLUE (TOWEL DISPOSABLE) ×3 IMPLANT
WATER STERILE IRR 1000ML POUR (IV SOLUTION) ×3 IMPLANT

## 2015-10-15 NOTE — Progress Notes (Signed)
Gave of IV Fentanyl at increments over 20 minutes, placed on pulse ox.

## 2015-10-15 NOTE — Transfer of Care (Signed)
Immediate Anesthesia Transfer of Care Note  Patient: Dakota Green  Procedure(s) Performed: Procedure(s) with comments: LUMBAR THREE-FOUR LUMBAR LAMINECTOMY/DECOMPRESSION MICRODISCECTOMY  (Right) - Right L34 microdiskectomy  Patient Location: PACU  Anesthesia Type:General  Level of Consciousness: awake, alert  and oriented  Airway & Oxygen Therapy: Patient Spontanous Breathing and Patient connected to nasal cannula oxygen  Post-op Assessment: Report given to RN and Post -op Vital signs reviewed and stable  Post vital signs: Reviewed and stable  Last Vitals:  Filed Vitals:   10/15/15 1302  BP: 176/107  Pulse: 94  Temp: 36.4 C  Resp: 18    Complications: No apparent anesthesia complications

## 2015-10-15 NOTE — Anesthesia Preprocedure Evaluation (Signed)
Anesthesia Evaluation  Patient identified by MRN, date of birth, ID band Patient awake    Reviewed: Allergy & Precautions, NPO status , Patient's Chart, lab work & pertinent test results  Airway Mallampati: II  TM Distance: >3 FB Neck ROM: Full    Dental  (+) Teeth Intact, Dental Advisory Given   Pulmonary former smoker,    breath sounds clear to auscultation       Cardiovascular hypertension, + CAD and + Past MI   Rhythm:Regular Rate:Normal     Neuro/Psych  Neuromuscular disease    GI/Hepatic PUD, GERD  Medicated,  Endo/Other    Renal/GU      Musculoskeletal  (+) Arthritis ,   Abdominal   Peds  Hematology   Anesthesia Other Findings   Reproductive/Obstetrics                             Anesthesia Physical  Anesthesia Plan  ASA: III  Anesthesia Plan: General   Post-op Pain Management:    Induction: Intravenous  Airway Management Planned: Oral ETT  Additional Equipment:   Intra-op Plan:   Post-operative Plan: Extubation in OR  Informed Consent: I have reviewed the patients History and Physical, chart, labs and discussed the procedure including the risks, benefits and alternatives for the proposed anesthesia with the patient or authorized representative who has indicated his/her understanding and acceptance.   Dental advisory given  Plan Discussed with: CRNA and Anesthesiologist  Anesthesia Plan Comments:         Anesthesia Quick Evaluation

## 2015-10-15 NOTE — Anesthesia Postprocedure Evaluation (Signed)
  Anesthesia Post-op Note  Patient: Dakota Green  Procedure(s) Performed: Procedure(s) with comments: LUMBAR THREE-FOUR LUMBAR LAMINECTOMY/DECOMPRESSION MICRODISCECTOMY  (Right) - Right L34 microdiskectomy  Patient Location: PACU  Anesthesia Type:General  Level of Consciousness: awake, oriented, sedated and patient cooperative  Airway and Oxygen Therapy: Patient Spontanous Breathing  Post-op Pain: mild  Post-op Assessment: Post-op Vital signs reviewed, Patient's Cardiovascular Status Stable, Respiratory Function Stable, Patent Airway, No signs of Nausea or vomiting and Pain level not controlled LLE Motor Response: Purposeful movement, Responds to commands LLE Sensation: No numbness RLE Motor Response: Purposeful movement, Responds to commands RLE Sensation: No numbness      Post-op Vital Signs: stable  Last Vitals:  Filed Vitals:   10/15/15 2000  BP:   Pulse: 99  Temp: 37.2 C  Resp: 17    Complications: No apparent anesthesia complications

## 2015-10-15 NOTE — Op Note (Signed)
.  Brief history: The patient is a 81 year old white male who has complained of back and right leg pain consistent with a lumbar radiculopathy. He has failed medical management and was worked up with a lumbar MRI which demonstrated an extreme lateral herniated disc at L3-4 on the right. I discussed the situation with the patient and his wife. We discussed the various treatment options. He decided to proceed with surgery after weighing the risks, benefits, and alternatives.  Preoperative diagnosis: Right L3-4 extreme lateral herniated disc, lumbago, lumbar radiculopathy  Postoperative diagnosis: The same  Procedure: Right L3-4 far lateral Intervertebral discectomy using micro-dissection  Surgeon: Dr. Delma Officer  Asst.: Dr. Daryl Eastern  Anesthesia: Gen. endotracheal  Estimated blood loss: Minimal  Drains: None  Complications: None  Description of procedure: The patient was brought to the operating room by the anesthesia team. General endotracheal anesthesia was induced. The patient was turned to the prone position on the Wilson frame. The patient's lumbosacral region was then prepared with Betadine scrub and Betadine solution. Sterile drapes were applied.  I then injected the area to be incised with Marcaine with epinephrine solution. I then used a scalpel to make a linear midline incision over the L3-4 intervertebral disc space. I then used electrocautery to perform a right sided subperiosteal dissection exposing facet of L3-4 on the right. We obtained intraoperative radiograph to confirm our location. I then inserted the [facility] retractor for exposure.  We then brought the operative microscope into the field. Under its magnification and illumination we completed the microdissection. I used a high-speed drill to drill off the lateral aspect of the left L3 pars. I used microdissection to dissected through the intertransverse ligament. I removed the ligament with a Kerrison punch. I dissected  through the soft tissues and identified the exiting L3 nerve root. We dissected caudal and distal to the nerve root and identified the far lateral herniated disc. We removed multiple fragments using the micropituitary forceps.   I then palpated along the ventral surface of right L3 nerve root and noted that the neural structures were well decompressed. This completed the decompression.  We then obtained hemostasis using bipolar electrocautery. We irrigated the wound out with bacitracin solution. We then removed the retractor. We then reapproximated the patient's thoracolumbar fascia with interrupted #1 Vicryl suture. We then reapproximated the patient's subcutaneous tissue with interrupted 2-0 Vicryl suture. We then reapproximated patient's skin with Steri-Strips and benzoin. The was then coated with bacitracin ointment. The drapes were removed. The patient was subsequently returned to the supine position where they were extubated by the anesthesia team. The patient was then transported to the postanesthesia care unit in stable condition. All sponge instrument and needle counts were reportedly correct at the end of this case.

## 2015-10-15 NOTE — Progress Notes (Signed)
Subjective:  The patient is somnolent but arousable. He is in no apparent distress.  Objective: Vital signs in last 24 hours: Temp:  [97.6 F (36.4 C)] 97.6 F (36.4 C) (11/16 1302) Pulse Rate:  [94] 94 (11/16 1302) Resp:  [18] 18 (11/16 1302) BP: (176)/(107) 176/107 mmHg (11/16 1302) SpO2:  [93 %-97 %] 93 % (11/16 1354) Weight:  [67.728 kg (149 lb 5 oz)] 67.728 kg (149 lb 5 oz) (11/16 1250)  Intake/Output from previous day:   Intake/Output this shift: Total I/O In: 1500 [I.V.:1500] Out: 50 [Blood:50]  Physical exam the patient is somewhat but arousable. He is moving his lower extremities well.  Lab Results:  Recent Labs  10/13/15 1322  WBC 6.8  HGB 12.5*  HCT 39.4  PLT 216   BMET  Recent Labs  10/13/15 1322  NA 139  K 4.7  CL 102  CO2 29  GLUCOSE 154*  BUN 8  CREATININE 0.97  CALCIUM 9.7    Studies/Results: No results found.  Assessment/Plan: The patient is doing well. I spoke with his wife.      Shenika Quint D 10/15/2015, 6:38 PM

## 2015-10-15 NOTE — H&P (Signed)
Subjective: The patient is an 81 year old white male on whom I previously performed a lumbar laminectomy for spinal stenosis. Initially has done well but developed back and right leg pain consistent with a lumbar radiculopathy. He has failed medical management and was worked up with a lumbar MRI. This demonstrated a herniated disc at L3-4. I discussed the situation with the patient and his wife. We discussed the various treatment options. He has decided to proceed with surgery.   Past Medical History  Diagnosis Date  . ALLERGIC RHINITIS 05/04/2007  . CORONARY ARTERY DISEASE 05/04/2007  . DIVERTICULOSIS, COLON 08/14/2009  . ESOPHAGEAL STRICTURE 08/14/2009  . GERD 05/04/2007  . HIATAL HERNIA 08/14/2009  . HYPERLIPIDEMIA 05/04/2007  . HYPERTENSION 05/04/2007  . Osteoarth NOS-Unspec 05/04/2007  . PEPTIC ULCER DISEASE 05/04/2007    bleeding ulcer with hospitalization  . DJD (degenerative joint disease)   . Myocardial infarction (HCC) 1987  . Torn rotator cuff     right  . Internal hemorrhoids   . BPH (benign prostatic hyperplasia)   . Bleeding ulcer 1985  . Bruises easily     Past Surgical History  Procedure Laterality Date  . Cataract extraction Bilateral   . Cardiac catheterization  06/12/2007    EF 45%  . Cardiac catheterization  01/18/2005    EF 50-55%  . Cardiac catheterization  01/14/2003    EF 55%  . US echocardiography  08/23/2005    EF 50-55%  . Cardiovascular stress test  02/07/2001  . Tonsillectomy    . Shoulder arthroscopy  2013    right  . Shoulder arthroscopy with rotator cuff repair and subacromial decompression Left 05/14/2014    Procedure: LEFT SHOULDER ARTHROSCOPY WITH DEBRIDEMENT EXTENSIVE, DISAL CLAVICULECTOMY, SUBACROMIAL DECOMPRESSION PARTIAL ACROMIOPLASTY WITH CORACOACROMIAL RELEASE AND ROTATOR CUFF REPAIR;  Surgeon: Nilda Simmer, MD;  Location: [facility];  Service: Orthopedics;  Laterality: Left;  Marland Kitchen Eye surgery    . Coronary artery bypass graft      X 4   . Back surgery  05/05/2015    Spinal Stenosis, Rod and 2 screws placed in spine  . Coronary angioplasty with stent placement      1998  . Coronary angioplasty      1987  . Colonoscopy    . Upper gi endoscopy      X 3    Allergies  Allergen Reactions  . Statins Other (See Comments)    Muscle aches  . Crestor [Rosuvastatin Calcium]     Aches   . Latex Other (See Comments)    Makes skin red & causes irritation    Social History  Substance Use Topics  . Smoking status: Former Smoker -- 4.00 packs/day for 35 years    Quit date: 11/29/1985  . Smokeless tobacco: Never Used  . Alcohol Use: 4.2 oz/week    7 Shots of liquor per week     Comment: occasional    Family History  Problem Relation Age of Onset  . Heart attack Mother   . Heart attack Father   . Prostate cancer Brother   . Colon cancer Neg Hx    Prior to Admission medications   Medication Sig Start Date End Date Taking? Authorizing Provider  ALPHAGAN P 0.1 % SOLN Place 1 drop into the right eye 2 (two) times daily.  04/03/13  Yes Historical Provider, MD  aspirin 81 MG tablet Take 81 mg by mouth daily.     Yes Historical Provider, MD  calcium-vitamin D (Ruthell Rummage WITH  D) 500-200 MG-UNIT per tablet Take 2 tablets by mouth daily with breakfast.   Yes Historical Provider, MD  celecoxib (CELEBREX) 200 MG capsule TAKE 1 CAPSULE BY MOUTH 2 TIMES A DAY 08/18/15  Yes Gordy Savers, MD  clopidogrel (PLAVIX) 75 MG tablet TAKE 1 TABLET BY MOUTH DAILY 05/21/15  Yes Gordy Savers, MD  docusate sodium (COLACE) 100 MG capsule Take 1 capsule (100 mg total) by mouth 2 (two) times daily. 05/07/15  Yes Tressie Stalker, MD  ezetimibe (ZETIA) 10 MG tablet Take 1 tablet (10 mg total) by mouth daily. 10/11/14  Yes Vesta Mixer, MD  gabapentin (NEURONTIN) 100 MG capsule TAKE 3 CAPSULES BY MOUTH 3 TIMES A DAY 08/19/15  Yes Historical Provider, MD  loratadine (CLARITIN) 10 MG tablet Take 10 mg by mouth daily.     Yes Historical Provider, MD   Multiple Vitamins-Minerals (PRESERVISION/LUTEIN PO) Take 1 tablet by mouth 2 (two) times daily.    Yes Historical Provider, MD  NEXIUM 40 MG capsule Take 1 capsule (40 mg total) by mouth daily before breakfast. 12/31/14  Yes Hart Carwin, MD  predniSONE (DELTASONE) 5 MG tablet TAKE 1 TABLET BY MOUTH DAILY 06/23/15  Yes Gordy Savers, MD  tamsulosin (FLOMAX) 0.4 MG CAPS capsule Take 0.4 mg by mouth 2 (two) times daily.   Yes Historical Provider, MD  temazepam (RESTORIL) 30 MG capsule TAKE ONE CAPSULE BY MOUTH AT BEDTIME Patient taking differently: TAKE ONE CAPSULE BY MOUTH AT BEDTIME FOR SLEEP 08/11/15  Yes Gordy Savers, MD  traMADol (ULTRAM) 50 MG tablet Take 1-2 tablets (50-100 mg total) by mouth every 6 (six) hours as needed for moderate pain (1-2 tablets q6hrs prn). 05/07/15  Yes Tressie Stalker, MD  FLUZONE HIGH-DOSE 0.5 ML SUSY Inject as directed once. 08/26/15   Historical Provider, MD  IRON PO Take 1 tablet by mouth daily.    Historical Provider, MD     Review of Systems  Positive ROS: As above  All other systems have been reviewed and were otherwise negative with the exception of those mentioned in the HPI and as above.  Objective: Vital signs in last 24 hours: Temp:  [97.6 F (36.4 C)] 97.6 F (36.4 C) (11/16 1302) Pulse Rate:  [94] 94 (11/16 1302) Resp:  [18] 18 (11/16 1302) BP: (176)/(107) 176/107 mmHg (11/16 1302) SpO2:  [93 %-97 %] 93 % (11/16 1354) Weight:  [67.728 kg (149 lb 5 oz)] 67.728 kg (149 lb 5 oz) (11/16 1250)  General Appearance: Alert, cooperative, no distress, Head: Normocephalic, without obvious abnormality, atraumatic Eyes: PERRL, conjunctiva/corneas clear, EOM's intact,    Ears: Normal  Throat: Normal  Neck: Supple, symmetrical, trachea midline, no adenopathy; thyroid: No enlargement/tenderness/nodules; no carotid bruit or JVD Back: Symmetric, no curvature, ROM normal, no CVA tenderness. The patient's lumbar incision is well-healed. Lungs:  Clear to auscultation bilaterally, respirations unlabored Heart: Regular rate and rhythm, no murmur, rub or gallop Abdomen: Soft, non-tender,, no masses, no organomegaly Extremities: Extremities normal, atraumatic, no cyanosis or edema Pulses: 2+ and symmetric all extremities Skin: Skin color, texture, turgor normal, no rashes or lesions  NEUROLOGIC:   Mental status: alert and oriented, no aphasia, good attention span, Fund of knowledge/ memory ok Motor Exam - grossly normal Sensory Exam - grossly normal Reflexes:  Coordination - grossly normal Gait - grossly normal Balance - grossly normal Cranial Nerves: I: smell Not tested  II: visual acuity  OS: Normal  OD: Normal   II: visual fields  Full to confrontation  II: pupils Equal, round, reactive to light  III,VII: ptosis None  III,IV,VI: extraocular muscles  Full ROM  V: mastication Normal  V: facial light touch sensation  Normal  V,VII: corneal reflex  Present  VII: facial muscle function - upper  Normal  VII: facial muscle function - lower Normal  VIII: hearing Not tested  IX: soft palate elevation  Normal  IX,X: gag reflex Present  XI: trapezius strength  5/5  XI: sternocleidomastoid strength 5/5  XI: neck flexion strength  5/5  XII: tongue strength  Normal    Data Review Lab Results  Component Value Date   WBC 6.8 10/13/2015   HGB 12.5* 10/13/2015   HCT 39.4 10/13/2015   MCV 91.0 10/13/2015   PLT 216 10/13/2015   Lab Results  Component Value Date   NA 139 10/13/2015   K 4.7 10/13/2015   CL 102 10/13/2015   CO2 29 10/13/2015   BUN 8 10/13/2015   CREATININE 0.97 10/13/2015   GLUCOSE 154* 10/13/2015   Lab Results  Component Value Date   INR 1.03 04/30/2015    Assessment/Plan: Right L3-4 herniated disc, lumbago, lumbar radiculopathy: I have discussed the situation with the patient and his wife. I have reviewed his MRI scan with them and pointed out the abnormalities. We have discussed the various treatment  options including surgery. I have described the surgical treatment option of a right L3-4 discectomy. I have shown them surgical models. We have discussed the risks, benefits, alternatives, and likelihood of achieving our goals with surgery. I have answered all the patient's questions. He has decided to proceed with surgery.   Jericho Alcorn D 10/15/2015 2:54 PM

## 2015-10-15 NOTE — Progress Notes (Signed)
Patient arrived to 5C15 AAOx4. Vitals taken, patient comfortable in the bed. SCDs on. Will continue to monitor. Joslynne Klatt, [place] Scrape, RN

## 2015-10-15 NOTE — Anesthesia Procedure Notes (Signed)
Procedure Name: Intubation Date/Time: 10/15/2015 4:00 PM Performed by: Eligha Bridegroom Pre-anesthesia Checklist: Patient identified, Patient being monitored, Emergency Drugs available, Timeout performed and Suction available Patient Re-evaluated:Patient Re-evaluated prior to inductionOxygen Delivery Method: Circle system utilized Preoxygenation: Pre-oxygenation with 100% oxygen Intubation Type: IV induction Ventilation: Mask ventilation without difficulty and Oral airway inserted - appropriate to patient size Laryngoscope Size: Mac and 4 Grade View: Grade II Tube type: Oral Tube size: 7.5 mm Number of attempts: 1 Airway Equipment and Method: Stylet and LTA kit utilized Placement Confirmation: ETT inserted through vocal cords under direct vision,  breath sounds checked- equal and bilateral and positive ETCO2 Secured at: 22 cm Tube secured with: Tape Dental Injury: Teeth and Oropharynx as per pre-operative assessment

## 2015-10-16 ENCOUNTER — Encounter (HOSPITAL_COMMUNITY): Payer: Self-pay | Admitting: Neurosurgery

## 2015-10-16 DIAGNOSIS — M5126 Other intervertebral disc displacement, lumbar region: Secondary | ICD-10-CM | POA: Diagnosis not present

## 2015-10-16 MED ORDER — GABAPENTIN 300 MG PO CAPS
300.0000 mg | ORAL_CAPSULE | Freq: Three times a day (TID) | ORAL | Status: DC
  Administered 2015-10-16 (×2): 300 mg via ORAL
  Filled 2015-10-16 (×2): qty 1

## 2015-10-16 NOTE — Progress Notes (Signed)
Patient ID: Dakota Green, male   DOB: February 06, 1933, 81 y.o.   MRN: [id number] Subjective:  The patient is alert and pleasant. He complains of some back and leg pain.  Objective: Vital signs in last 24 hours: Temp:  [98.1 F (36.7 C)-98.9 F (37.2 C)] 98.3 F (36.8 C) (11/17 1352) Pulse Rate:  [73-99] 77 (11/17 1352) Resp:  [12-20] 20 (11/17 1352) BP: (102-197)/(58-106) 113/67 mmHg (11/17 1352) SpO2:  [93 %-100 %] 93 % (11/17 1352)  Intake/Output from previous day: 11/16 0701 - 11/17 0700 In: 1750 [I.V.:1750] Out: 325 [Urine:275; Blood:50] Intake/Output this shift: Total I/O In: 600 [P.O.:600] Out: 600 [Urine:600]  Physical exam the patient is alert and pleasant. He is moving his lower extremities well.   Lab Results: No results for input(s): WBC, HGB, HCT, PLT in the last 72 hours. BMET No results for input(s): NA, K, CL, CO2, GLUCOSE, BUN, CREATININE, CALCIUM in the last 72 hours.  Studies/Results: Dg Lumbar Spine 1 View  10/15/2015  CLINICAL DATA:  Right L3-4 micro discectomy with laminectomy and decompression. EXAM: LUMBAR SPINE - 1 VIEW COMPARISON:  MRI 09/10/2015.  Office radiographs 08/18/2005) FINDINGS: Cross-table lateral at 1638 hours. There are stable postsurgical changes status post PLIF at L4-5. Skin spreaders are present at L3 with a blunt surgical instrument directed towards the posterior aspect of the L3-4 disc space. The alignment is stable. IMPRESSION: Intraoperative localization as described. Electronically Signed   By: Carey Bullocks M.D.   On: 10/15/2015 18:53    Assessment/Plan: Postop day #1: We will continue to mobilize the patient with PT. He may go home tomorrow. I will increase his Neurontin.      Shatina Streets D 10/16/2015, 3:38 PM

## 2015-10-16 NOTE — Care Management Note (Signed)
Case Management Note  Patient Details  Name: Dakota Green MRN: [id number] Date of Birth: 1933-03-24  Subjective/Objective:   Patient admitted for L3-4 microdiscectomy. Patient lives at home with his wife.                 Action/Plan: Await PT recommendations. CM will continue to follow for discharge needs.   Expected Discharge Date:                  Expected Discharge Plan:     In-House Referral:     Discharge planning Services     Post Acute Care Choice:    Choice offered to:     DME Arranged:    DME Agency:     HH Arranged:    HH Agency:     Status of Service:  In process, will continue to follow  Medicare Important Message Given:    Date Medicare IM Given:    Medicare IM give by:    Date Additional Medicare IM Given:    Additional Medicare Important Message give by:     If discussed at Long Length of Stay Meetings, dates discussed:    Additional Comments:  Kermit Balo, RN 10/16/2015, 1:52 PM

## 2015-10-16 NOTE — Evaluation (Signed)
Physical Therapy Evaluation Patient Details Name: Dakota Green MRN: [id number] DOB: Aug 03, 1933 Today's Date: 10/16/2015   History of Present Illness  Pt is an 81 y/o male who previously underwent a lumbar laminectomy for spinal stenosis. Initially pt did well but developed herniated disc at L3-4. Pt is now s/p R L3-L4 lateral intervertebral discectomy on 10/15/15.  Clinical Impression  Pt admitted with above diagnosis. Pt currently with functional limitations due to the deficits listed below (see PT Problem List). At the time of PT eval pt was able to perform transfers and ambulation with min guard assist and RW for support. Pt reports increased pain at beginning of session which was improved at end of session when asked. Pt will benefit from skilled PT to increase their independence and safety with mobility to allow discharge to the venue listed below.       Follow Up Recommendations Outpatient PT (When appropriate per post-op protocol)    Equipment Recommendations  None recommended by PT    Recommendations for Other Services       Precautions / Restrictions Precautions Precautions: Fall;Back Precaution Booklet Issued: Yes (comment) Precaution Comments: Pt and wife were educated on back precautions.  Restrictions Weight Bearing Restrictions: No      Mobility  Bed Mobility               General bed mobility comments: Pt sitting up in recliner upon PT arrival.   Transfers Overall transfer level: Needs assistance Equipment used: Rolling walker (2 wheeled) Transfers: Sit to/from Stand Sit to Stand: Min guard         General transfer comment: Hands-on guarding for safety but no physical assist required.   Ambulation/Gait Ambulation/Gait assistance: Min guard Ambulation Distance (Feet): 400 Feet Assistive device: Rolling walker (2 wheeled) Gait Pattern/deviations: Step-through pattern;Decreased stride length;Trunk flexed Gait velocity: Decreased Gait velocity  interpretation: Below normal speed for age/gender General Gait Details: VC's for improved posture and walker placement closer to pt's body.   Stairs Stairs: Yes Stairs assistance: Min guard Stair Management: One rail Right;Step to pattern;Forwards Number of Stairs: 3 General stair comments: VC's for sequencing and technique. Min guard for safety.   Wheelchair Mobility    Modified Rankin (Stroke Patients Only)       Balance Overall balance assessment: Needs assistance Sitting-balance support: Feet supported;No upper extremity supported Sitting balance-Leahy Scale: Fair     Standing balance support: No upper extremity supported;During functional activity Standing balance-Leahy Scale: Fair (Static balance edge of chair.)                               Pertinent Vitals/Pain Pain Assessment: Faces Faces Pain Scale: Hurts little more Pain Location: Back incisional site Pain Descriptors / Indicators: Operative site guarding;Discomfort Pain Intervention(s): Limited activity within patient's tolerance;Monitored during session;Repositioned    Home Living Family/patient expects to be discharged to:: Private residence Living Arrangements: Spouse/significant other Available Help at Discharge: Family;Available 24 hours/day Type of Home: House Home Access: Stairs to enter Entrance Stairs-Rails: None Entrance Stairs-Number of Steps: 2 Home Layout: One level;Laundry or work area in basement;Able to live on main level with bedroom/bathroom Home Equipment: Information systems manager - built in;Hand held Careers information officer - 2 wheels      Prior Function Level of Independence: Independent               Hand Dominance   Dominant Hand: Right    Extremity/Trunk Assessment   Upper  Extremity Assessment: Defer to OT evaluation           Lower Extremity Assessment: Generalized weakness      Cervical / Trunk Assessment: Kyphotic  Communication   Communication: No  difficulties  Cognition Arousal/Alertness: Awake/alert Behavior During Therapy: WFL for tasks assessed/performed Overall Cognitive Status: Within Functional Limits for tasks assessed                      General Comments      Exercises        Assessment/Plan    PT Assessment Patient needs continued PT services  PT Diagnosis Difficulty walking;Generalized weakness   PT Problem List Decreased strength;Decreased range of motion;Decreased activity tolerance;Decreased balance;Decreased mobility;Decreased knowledge of use of DME;Decreased safety awareness;Decreased knowledge of precautions;Pain  PT Treatment Interventions DME instruction;Gait training;Stair training;Functional mobility training;Therapeutic activities;Therapeutic exercise;Neuromuscular re-education;Patient/family education   PT Goals (Current goals can be found in the Care Plan section) Acute Rehab PT Goals Patient Stated Goal: Home today PT Goal Formulation: With patient/family Time For Goal Achievement: 10/23/15 Potential to Achieve Goals: Good    Frequency Min 5X/week   Barriers to discharge        Co-evaluation               End of Session Equipment Utilized During Treatment: Gait belt Activity Tolerance: Patient tolerated treatment well Patient left: in chair;with call bell/phone within reach;with family/visitor present Nurse Communication: Mobility status    Functional Assessment Tool Used: Clinical judgement Functional Limitation: Mobility: Walking and moving around Mobility: Walking and Moving Around Current Status [phone]): At least 20 percent but less than 40 percent impaired, limited or restricted Mobility: Walking and Moving Around Goal Status [phone]): At least 20 percent but less than 40 percent impaired, limited or restricted    Time: 1013-1038 PT Time Calculation (min) (ACUTE ONLY): 25 min   Charges:   PT Evaluation $Initial PT Evaluation Tier I: 1 Procedure PT  Treatments $Gait Training: 8-22 mins   PT G Codes:   PT G-Codes **NOT FOR INPATIENT CLASS** Functional Assessment Tool Used: Clinical judgement Functional Limitation: Mobility: Walking and moving around Mobility: Walking and Moving Around Current Status ([id number]): At least 20 percent but less than 40 percent impaired, limited or restricted Mobility: Walking and Moving Around Goal Status [phone]): At least 20 percent but less than 40 percent impaired, limited or restricted    Conni Slipper 10/16/2015, 11:09 AM   Conni Slipper, PT, DPT Acute Rehabilitation Services Pager: [phone]

## 2015-10-17 DIAGNOSIS — M5126 Other intervertebral disc displacement, lumbar region: Secondary | ICD-10-CM | POA: Diagnosis not present

## 2015-10-17 MED ORDER — TRAMADOL HCL 50 MG PO TABS: 50.0000 mg | ORAL_TABLET | Freq: Four times a day (QID) | ORAL | Status: DC | PRN

## 2015-10-17 MED ORDER — CYCLOBENZAPRINE HCL 5 MG PO TABS: 5.0000 mg | ORAL_TABLET | Freq: Three times a day (TID) | ORAL | Status: DC | PRN

## 2015-10-17 MED ORDER — OXYCODONE-ACETAMINOPHEN 10-325 MG PO TABS: 1.0000 | ORAL_TABLET | ORAL | Status: DC | PRN

## 2015-10-17 MED ORDER — GABAPENTIN 300 MG PO CAPS: 300.0000 mg | ORAL_CAPSULE | Freq: Three times a day (TID) | ORAL | Status: DC

## 2015-10-17 NOTE — Progress Notes (Signed)
Pt discharged home with spouse. No home health ordered. IV removed and discharge instructions given. Will be transported from unit via wheelchair with volunteer services at 0945. Lawson Radar

## 2015-10-17 NOTE — Discharge Summary (Signed)
Physician Discharge Summary  Patient ID: Dakota Green MRN: [id number] DOB/AGE: Apr 07, 1933 81 y.o.  Admit date: 10/15/2015 Discharge date: 10/17/2015  Admission Diagnoses: Right L3-4 far lateral herniated disc, lumbago, lumbar radiculopathy  Discharge Diagnoses: The same Active Problems:   Lumbar herniated disc   Discharged Condition: good  Hospital Course: I performed a right L3-4 far lateral discectomy on the patient on 10/15/2015. The surgery went well.  The patient's postoperative course was unremarkable. He had some leg pain. We treated this with Neurontin.  On postoperative day #2 the patient felt much better and requested discharge to home. The patient, and his wife, were given written and oral discharge instructions. All their questions were answered.  Consults: Physical therapy Significant Diagnostic Studies: None Treatments: Right L3-4 far lateral discectomy using microdissection Discharge Exam: Blood pressure 104/66, pulse 74, temperature 98.4 F (36.9 C), temperature source Oral, resp. rate 18, height  (1.778 m), weight 67.728 kg (149 lb 5 oz), SpO2 93 %. The patient is alert and pleasant. He looks well. His strength is normal in his lower extremities.  Disposition: Home  Discharge Instructions    Call MD for:  difficulty breathing, headache or visual disturbances    Complete by:  As directed      Call MD for:  extreme fatigue    Complete by:  As directed      Call MD for:  hives    Complete by:  As directed      Call MD for:  persistant dizziness or light-headedness    Complete by:  As directed      Call MD for:  persistant nausea and vomiting    Complete by:  As directed      Call MD for:  redness, tenderness, or signs of infection (pain, swelling, redness, odor or green/yellow discharge around incision site)    Complete by:  As directed      Call MD for:  severe uncontrolled pain    Complete by:  As directed      Call MD for:  temperature >100.4     Complete by:  As directed      Diet - low sodium heart healthy    Complete by:  As directed      Discharge instructions    Complete by:  As directed   Call [phone] for a followup appointment. Take a stool softener while you are using pain medications.     Driving Restrictions    Complete by:  As directed   Do not drive for 2 weeks.     Increase activity slowly    Complete by:  As directed      Lifting restrictions    Complete by:  As directed   Do not lift more than 5 pounds. No excessive bending or twisting.     May shower / Bathe    Complete by:  As directed   He may shower after the pain she is removed 3 days after surgery. Leave the incision alone.     Remove dressing in 24 hours    Complete by:  As directed             Medication List    TAKE these medications        ALPHAGAN P 0.1 % Soln  Generic drug:  brimonidine  Place 1 drop into the right eye 2 (two) times daily.     aspirin 81 MG tablet  Take 81 mg by mouth daily.  calcium-vitamin D 500-200 MG-UNIT tablet  Commonly known as:  OSCAL WITH D  Take 2 tablets by mouth daily with breakfast.     celecoxib 200 MG capsule  Commonly known as:  CELEBREX  TAKE 1 CAPSULE BY MOUTH 2 TIMES A DAY     clopidogrel 75 MG tablet  Commonly known as:  PLAVIX  TAKE 1 TABLET BY MOUTH DAILY     cyclobenzaprine 5 MG tablet  Commonly known as:  FLEXERIL  Take 1 tablet (5 mg total) by mouth 3 (three) times daily as needed for muscle spasms.     docusate sodium 100 MG capsule  Commonly known as:  COLACE  Take 1 capsule (100 mg total) by mouth 2 (two) times daily.     ezetimibe 10 MG tablet  Commonly known as:  ZETIA  Take 1 tablet (10 mg total) by mouth daily.     FLUZONE HIGH-DOSE 0.5 ML Susy  Generic drug:  Influenza Vac Split High-Dose  Inject as directed once.     gabapentin 300 MG capsule  Commonly known as:  NEURONTIN  Take 1 capsule (300 mg total) by mouth 3 (three) times daily.     IRON PO  Take 1  tablet by mouth daily.     loratadine 10 MG tablet  Commonly known as:  CLARITIN  Take 10 mg by mouth daily.     NEXIUM 40 MG capsule  Generic drug:  esomeprazole  Take 1 capsule (40 mg total) by mouth daily before breakfast.     oxyCODONE-acetaminophen 10-325 MG tablet  Commonly known as:  PERCOCET  Take 1 tablet by mouth every 4 (four) hours as needed for pain.     predniSONE 5 MG tablet  Commonly known as:  DELTASONE  TAKE 1 TABLET BY MOUTH DAILY     PRESERVISION/LUTEIN PO  Take 1 tablet by mouth 2 (two) times daily.     tamsulosin 0.4 MG Caps capsule  Commonly known as:  FLOMAX  Take 0.4 mg by mouth 2 (two) times daily.     temazepam 30 MG capsule  Commonly known as:  RESTORIL  TAKE ONE CAPSULE BY MOUTH AT BEDTIME     traMADol 50 MG tablet  Commonly known as:  ULTRAM  Take 1-2 tablets (50-100 mg total) by mouth every 6 (six) hours as needed for moderate pain (1-2 tablets q6hrs prn).     traMADol 50 MG tablet  Commonly known as:  ULTRAM  Take 1-2 tablets (50-100 mg total) by mouth every 6 (six) hours as needed for moderate pain (1-2 tablets q6hrs prn).         SignedTressie Stalker D 10/17/2015, 7:20 AM

## 2015-10-17 NOTE — Progress Notes (Signed)
PT Cancellation Note  Patient Details Name: Dakota Green MRN: [id number] DOB: 02-02-1933   Cancelled Treatment:    Reason Eval/Treat Not Completed: Pt about to discharge. Reviewed precautions and answered questions from pt and wife regarding positioning and activity at home. Will continue to follow until d/c.   Conni Slipper 10/17/2015, 8:46 AM   Conni Slipper, PT, DPT Acute Rehabilitation Services Pager: [phone]

## 2015-10-23 ENCOUNTER — Other Ambulatory Visit: Payer: Self-pay | Admitting: Internal Medicine

## 2015-11-06 ENCOUNTER — Other Ambulatory Visit: Payer: Self-pay | Admitting: Cardiovascular Disease

## 2015-11-16 ENCOUNTER — Other Ambulatory Visit: Payer: Self-pay | Admitting: Internal Medicine

## 2015-12-25 ENCOUNTER — Encounter: Payer: Self-pay | Admitting: Internal Medicine

## 2015-12-25 ENCOUNTER — Ambulatory Visit (INDEPENDENT_AMBULATORY_CARE_PROVIDER_SITE_OTHER): Payer: Medicare Other | Admitting: Internal Medicine

## 2015-12-25 ENCOUNTER — Other Ambulatory Visit: Payer: Self-pay | Admitting: Internal Medicine

## 2015-12-25 VITALS — BP 120/80 | HR 91 | Temp 98.4°F | Resp 20 | Ht 70.0 in | Wt 150.0 lb

## 2015-12-25 DIAGNOSIS — I2583 Coronary atherosclerosis due to lipid rich plaque: Secondary | ICD-10-CM

## 2015-12-25 DIAGNOSIS — M150 Primary generalized (osteo)arthritis: Secondary | ICD-10-CM | POA: Diagnosis not present

## 2015-12-25 DIAGNOSIS — I2510 Atherosclerotic heart disease of native coronary artery without angina pectoris: Secondary | ICD-10-CM | POA: Diagnosis not present

## 2015-12-25 DIAGNOSIS — I10 Essential (primary) hypertension: Secondary | ICD-10-CM

## 2015-12-25 DIAGNOSIS — M159 Polyosteoarthritis, unspecified: Secondary | ICD-10-CM

## 2015-12-25 NOTE — Patient Instructions (Signed)
Limit your sodium (Salt) intake    It is important that you exercise regularly, at least 20 minutes 3 to 4 times per week.  If you develop chest pain or shortness of breath seek  medical attention.  Take a calcium supplement, plus 800-1200 units of vitamin D  Return in 6 months for follow-up  

## 2015-12-25 NOTE — Progress Notes (Signed)
Pre visit review using our clinic review tool, if applicable. No additional management support is needed unless otherwise documented below in the visit note. 

## 2015-12-25 NOTE — Progress Notes (Signed)
Subjective:    Patient ID: Dakota Green, male    DOB: [birth year], 82 y.o.   MRN: [id number]  HPI  Wt Readings from Last 3 Encounters:  12/25/15 150 lb (68.04 kg)  10/15/15 149 lb 5 oz (67.728 kg)  10/13/15 149 lb 5 oz (67.29 kg)   82 year old patient who is seen today for his biannual follow-up.  He has essential hypertension and coronary artery disease.  He has had a recent cardiology follow-up.  He remains on dual antiplatelet therapy.  He does describe some mild bleeding into the skin.  His cardiac status has been stable. Last year he required 2 back operations.  In general doing much better. He has dyslipidemia and a statin intolerance. No cardiopulmonary complaints On complaint today is some chronic low back pain.  Past Medical History  Diagnosis Date  . ALLERGIC RHINITIS 05/04/2007  . CORONARY ARTERY DISEASE 05/04/2007  . DIVERTICULOSIS, COLON 08/14/2009  . ESOPHAGEAL STRICTURE 08/14/2009  . GERD 05/04/2007  . HIATAL HERNIA 08/14/2009  . HYPERLIPIDEMIA 05/04/2007  . HYPERTENSION 05/04/2007  . Osteoarth NOS-Unspec 05/04/2007  . PEPTIC ULCER DISEASE 05/04/2007    bleeding ulcer with hospitalization  . DJD (degenerative joint disease)   . Myocardial infarction ([facility]) 1987  . Torn rotator cuff     right  . Internal hemorrhoids   . BPH (benign prostatic hyperplasia)   . Bleeding ulcer 1985  . Bruises easily     Social History   Social History  . Marital Status: Married    Spouse Name: N/A  . Number of Children: N/A  . Years of Education: N/A   Occupational History  . Not on file.   Social History Main Topics  . Smoking status: Former Smoker -- 4.00 packs/day for 35 years    Quit date: 11/29/1985  . Smokeless tobacco: Never Used  . Alcohol Use: 4.2 oz/week    7 Shots of liquor per week     Comment: occasional  . Drug Use: No  . Sexual Activity: Not on file   Other Topics Concern  . Not on file   Social History Narrative    Past Surgical History  Procedure  Laterality Date  . Cataract extraction Bilateral   . Cardiac catheterization  06/12/2007    EF 45%  . Cardiac catheterization  01/18/2005    EF 50-55%  . Cardiac catheterization  01/14/2003    EF 55%  . US echocardiography  08/23/2005    EF 50-55%  . Cardiovascular stress test  02/07/2001  . Tonsillectomy    . Shoulder arthroscopy  2013    right  . Shoulder arthroscopy with rotator cuff repair and subacromial decompression Left 05/14/2014    Procedure: LEFT SHOULDER ARTHROSCOPY WITH DEBRIDEMENT EXTENSIVE, DISAL CLAVICULECTOMY, SUBACROMIAL DECOMPRESSION PARTIAL ACROMIOPLASTY WITH CORACOACROMIAL RELEASE AND ROTATOR CUFF REPAIR;  Surgeon: Nilda Simmer, MD;  Location: [facility];  Service: Orthopedics;  Laterality: Left;  Marland Kitchen Eye surgery    . Coronary artery bypass graft      X 4  . Back surgery  05/05/2015    Spinal Stenosis, Rod and 2 screws placed in spine  . Coronary angioplasty with stent placement      1998  . Coronary angioplasty      1987  . Colonoscopy    . Upper gi endoscopy      X 3  . Lumbar laminectomy/decompression microdiscectomy Right 10/15/2015    Procedure: LUMBAR THREE-FOUR LUMBAR LAMINECTOMY/DECOMPRESSION MICRODISCECTOMY ;  Surgeon: Tressie Stalker,  MD;  Location: [facility];  Service: Neurosurgery;  Laterality: Right;  Right L34 microdiskectomy    Family History  Problem Relation Age of Onset  . Heart attack Mother   . Heart attack Father   . Prostate cancer Brother   . Colon cancer Neg Hx     Allergies  Allergen Reactions  . Statins Other (See Comments)    Muscle aches  . Crestor [Rosuvastatin Calcium]     Aches   . Latex Other (See Comments)    Makes skin red & causes irritation    Current Outpatient Prescriptions on File Prior to Visit  Medication Sig Dispense Refill  . ALPHAGAN P 0.1 % SOLN Place 1 drop into the right eye 2 (two) times daily.     Marland Kitchen aspirin 81 MG tablet Take 81 mg by mouth daily.      . calcium-vitamin D (OSCAL WITH  D) 500-200 MG-UNIT per tablet Take 2 tablets by mouth daily with breakfast.    . celecoxib (CELEBREX) 200 MG capsule TAKE 1 CAPSULE BY MOUTH 2 TIMES A DAY 60 capsule 5  . clopidogrel (PLAVIX) 75 MG tablet TAKE 1 TABLET BY MOUTH DAILY 90 tablet 1  . gabapentin (NEURONTIN) 300 MG capsule Take 1 capsule (300 mg total) by mouth 3 (three) times daily. 60 capsule 1  . IRON PO Take 1 tablet by mouth daily.    Marland Kitchen loratadine (CLARITIN) 10 MG tablet Take 10 mg by mouth daily.      . Multiple Vitamins-Minerals (PRESERVISION/LUTEIN PO) Take 1 tablet by mouth 2 (two) times daily.     Marland Kitchen NEXIUM 40 MG capsule Take 1 capsule (40 mg total) by mouth daily before breakfast. 30 capsule 11  . predniSONE (DELTASONE) 5 MG tablet TAKE 1 TABLET BY MOUTH DAILY 90 tablet 1  . tamsulosin (FLOMAX) 0.4 MG CAPS capsule TAKE 2 CAPSULES (0.8 MG TOTAL) BY MOUTH DAILY. 180 capsule 3  . temazepam (RESTORIL) 30 MG capsule TAKE 1 CAPSULE AT BEDTIME 30 capsule 2  . traMADol (ULTRAM) 50 MG tablet Take 1-2 tablets (50-100 mg total) by mouth every 6 (six) hours as needed for moderate pain (1-2 tablets q6hrs prn). 100 tablet 1   No current facility-administered medications on file prior to visit.    BP 120/80 mmHg  Pulse 91  Temp(Src) 98.4 F (36.9 C) (Oral)  Resp 20  Ht  (1.778 m)  Wt 150 lb (68.04 kg)  BMI 21.52 kg/m2  SpO2 98%    Review of Systems  Constitutional: Negative for fever, chills, appetite change and fatigue.  HENT: Negative for congestion, dental problem, ear pain, hearing loss, sore throat, tinnitus, trouble swallowing and voice change.   Eyes: Negative for pain, discharge and visual disturbance.  Respiratory: Negative for cough, chest tightness, wheezing and stridor.   Cardiovascular: Negative for chest pain, palpitations and leg swelling.  Gastrointestinal: Negative for nausea, vomiting, abdominal pain, diarrhea, constipation, blood in stool and abdominal distention.  Genitourinary: Negative for  urgency, hematuria, flank pain, discharge, difficulty urinating and genital sores.  Musculoskeletal: Negative for myalgias, back pain, joint swelling, arthralgias, gait problem and neck stiffness.  Skin: Negative for rash.  Neurological: Negative for dizziness, syncope, speech difficulty, weakness, numbness and headaches.  Hematological: Negative for adenopathy. Does not bruise/bleed easily.  Psychiatric/Behavioral: Negative for behavioral problems and dysphoric mood. The patient is not nervous/anxious.        Objective:   Physical Exam  Constitutional: He is oriented to person, place, and time.  He appears well-developed.  HENT:  Head: Normocephalic.  Right Ear: External ear normal.  Left Ear: External ear normal.  Eyes: Conjunctivae and EOM are normal.  Neck: Normal range of motion.  Cardiovascular: Normal rate and normal heart sounds.   Pulmonary/Chest: Breath sounds normal.  Abdominal: Bowel sounds are normal.  Musculoskeletal: Normal range of motion. He exhibits no edema or tenderness.  Neurological: He is alert and oriented to person, place, and time.  Skin:  Ecchymoses involving the skin of the lower arms and dorsal aspects of the hand  Psychiatric: He has a normal mood and affect. His behavior is normal.          Assessment & Plan:   Coronary artery disease, stable Essential hypertension, well-controlled Osteoarthritis Dyslipidemia  No change in therapy Medications updated  Recheck 6 months

## 2015-12-26 ENCOUNTER — Ambulatory Visit: Payer: Medicare Other | Admitting: Internal Medicine

## 2016-01-09 ENCOUNTER — Telehealth: Payer: Self-pay | Admitting: Internal Medicine

## 2016-01-09 MED ORDER — CELECOXIB 200 MG PO CAPS: ORAL_CAPSULE | ORAL | Status: DC

## 2016-01-09 NOTE — Telephone Encounter (Signed)
[id number] - [place], [place] - [address]   Requesting refill of celecoxib (CELEBREX) 200 MG capsule

## 2016-01-09 NOTE — Telephone Encounter (Signed)
Rx sent to pharmacy   

## 2016-01-10 IMAGING — CR DG LUMBAR SPINE 1V
1 series · 1 of 1 positions shown · non-contrast
Comparison: MRI 09/10/2015.  Office radiographs 08/18/2005)

CLINICAL DATA: Right L3-4 micro discectomy with laminectomy and
decompression.

EXAM:
LUMBAR SPINE - 1 VIEW

[lat]
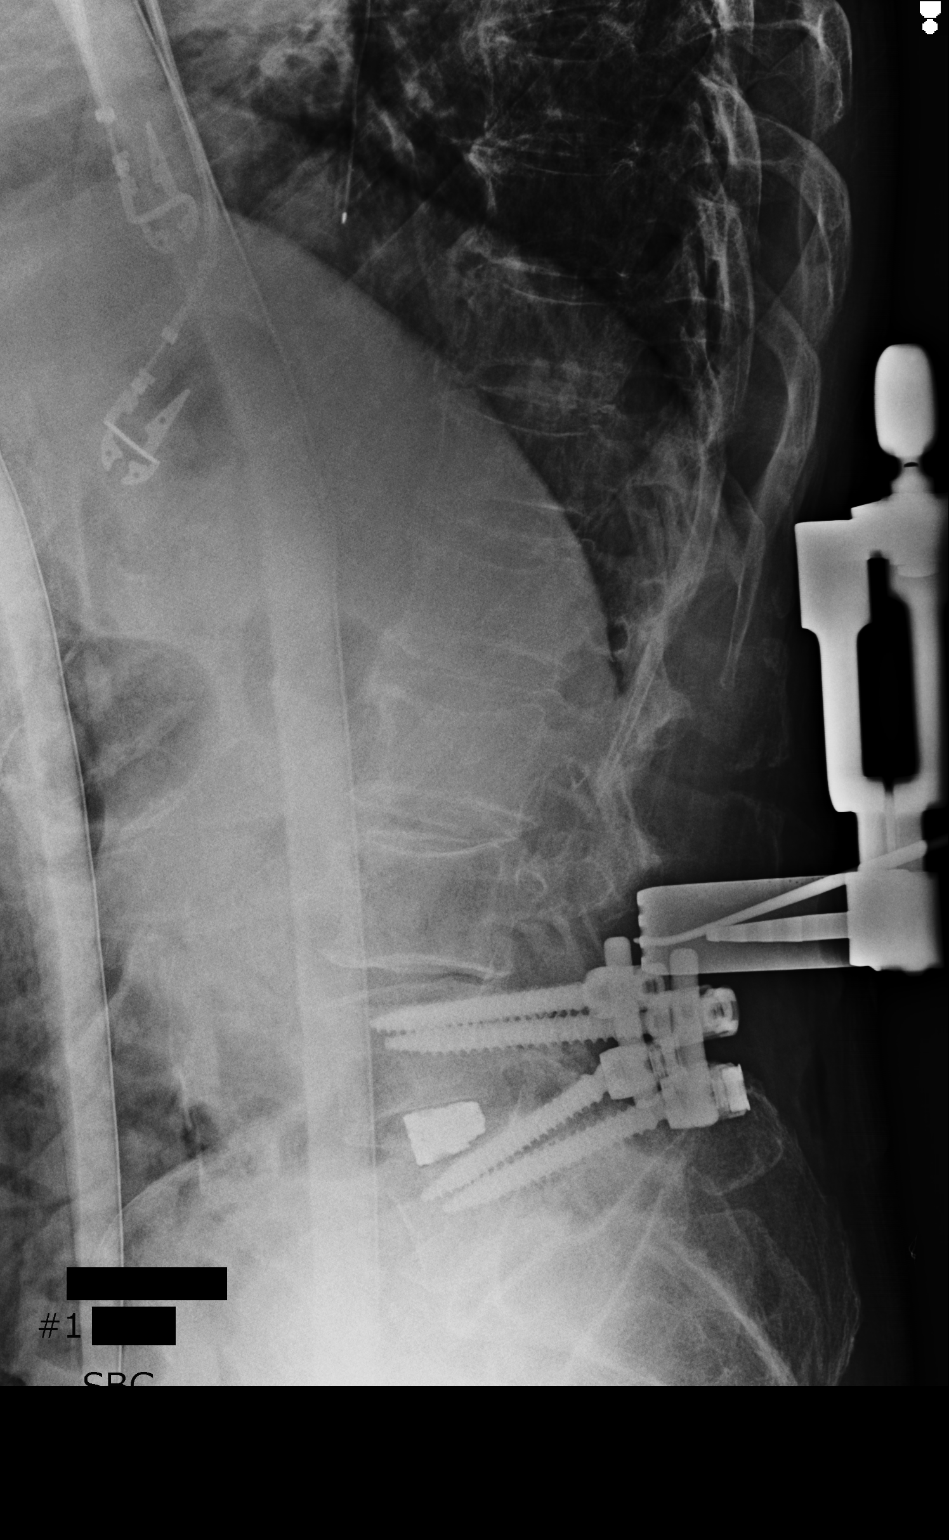

[1 of 1 positions shown; findings below may reference images not displayed]

FINDINGS: Cross-table lateral at 1357 hours. There are stable postsurgical
changes status post PLIF at L4-5. Skin spreaders are present at L3
with a blunt surgical instrument directed towards the posterior
aspect of the L3-4 disc space. The alignment is stable.
IMPRESSION: Intraoperative localization as described.

## 2016-02-16 ENCOUNTER — Other Ambulatory Visit: Payer: Self-pay | Admitting: Internal Medicine

## 2016-03-17 ENCOUNTER — Telehealth: Payer: Self-pay | Admitting: Internal Medicine

## 2016-03-17 NOTE — Telephone Encounter (Signed)
Called pharmacy and spoke to pharmacist Rocky Link and told him Dr.K is changing pt to Omeprazole 40 mg one tablet daily instead of Nexium. Rocky Link said that is interaction with all PPI's.and that pt has been on it for a long time so he would not feel it a benefit to change pt at this point and just monitor due to pt has not had any problems. Told him okay I will let Dr.K. Know.  Discussed with Dr.K what pharmacist Rocky Link said above and Dr.K said he did look it up on up to date and it showed the Omeprazole did not interact with Plavix it is mainly Nexium and Protonix and up to date does not agree with interactions. Also pt has tried H2 blockers like Tagamet Pepcid and has failed.  Called Rocky Link back and told him what Dr. Kirtland Bouchard said above about PPI's and that pt has tried H2 blockers and failed. Dr.K okay with pt staying on Nexium. Rocky Link verbalized understanding.

## 2016-03-17 NOTE — Telephone Encounter (Signed)
Pharm is calling to let md know there is  drug interaction with nexium and plavix. Please advise

## 2016-03-17 NOTE — Telephone Encounter (Signed)
Please change to omeprazole same dose

## 2016-04-05 ENCOUNTER — Emergency Department (HOSPITAL_COMMUNITY): Payer: Medicare Other

## 2016-04-05 ENCOUNTER — Emergency Department (HOSPITAL_COMMUNITY)
Admission: EM | Admit: 2016-04-05 | Discharge: 2016-04-05 | Disposition: A | Discharge status: Home / Self Care | Payer: Medicare Other | Attending: Emergency Medicine | Admitting: Emergency Medicine

## 2016-04-05 ENCOUNTER — Telehealth: Payer: Self-pay | Admitting: Internal Medicine

## 2016-04-05 ENCOUNTER — Encounter (HOSPITAL_COMMUNITY): Payer: Self-pay | Admitting: *Deleted

## 2016-04-05 DIAGNOSIS — Z8711 Personal history of peptic ulcer disease: Secondary | ICD-10-CM | POA: Insufficient documentation

## 2016-04-05 DIAGNOSIS — Z87891 Personal history of nicotine dependence: Secondary | ICD-10-CM | POA: Diagnosis not present

## 2016-04-05 DIAGNOSIS — Z7982 Long term (current) use of aspirin: Secondary | ICD-10-CM | POA: Diagnosis not present

## 2016-04-05 DIAGNOSIS — Z91040 Latex allergy status: Secondary | ICD-10-CM | POA: Insufficient documentation

## 2016-04-05 DIAGNOSIS — K921 Melena: Secondary | ICD-10-CM | POA: Diagnosis not present

## 2016-04-05 DIAGNOSIS — Z79899 Other long term (current) drug therapy: Secondary | ICD-10-CM | POA: Insufficient documentation

## 2016-04-05 DIAGNOSIS — N400 Enlarged prostate without lower urinary tract symptoms: Secondary | ICD-10-CM | POA: Insufficient documentation

## 2016-04-05 DIAGNOSIS — R042 Hemoptysis: Secondary | ICD-10-CM | POA: Diagnosis not present

## 2016-04-05 DIAGNOSIS — K219 Gastro-esophageal reflux disease without esophagitis: Secondary | ICD-10-CM | POA: Insufficient documentation

## 2016-04-05 DIAGNOSIS — M1990 Unspecified osteoarthritis, unspecified site: Secondary | ICD-10-CM | POA: Insufficient documentation

## 2016-04-05 DIAGNOSIS — Z9861 Coronary angioplasty status: Secondary | ICD-10-CM | POA: Insufficient documentation

## 2016-04-05 DIAGNOSIS — J0111 Acute recurrent frontal sinusitis: Secondary | ICD-10-CM | POA: Diagnosis not present

## 2016-04-05 DIAGNOSIS — I10 Essential (primary) hypertension: Secondary | ICD-10-CM | POA: Diagnosis not present

## 2016-04-05 DIAGNOSIS — I252 Old myocardial infarction: Secondary | ICD-10-CM | POA: Diagnosis not present

## 2016-04-05 DIAGNOSIS — Z792 Long term (current) use of antibiotics: Secondary | ICD-10-CM | POA: Diagnosis not present

## 2016-04-05 DIAGNOSIS — Z7952 Long term (current) use of systemic steroids: Secondary | ICD-10-CM | POA: Insufficient documentation

## 2016-04-05 DIAGNOSIS — Z9889 Other specified postprocedural states: Secondary | ICD-10-CM | POA: Insufficient documentation

## 2016-04-05 DIAGNOSIS — I2510 Atherosclerotic heart disease of native coronary artery without angina pectoris: Secondary | ICD-10-CM | POA: Diagnosis not present

## 2016-04-05 DIAGNOSIS — Z8639 Personal history of other endocrine, nutritional and metabolic disease: Secondary | ICD-10-CM | POA: Insufficient documentation

## 2016-04-05 DIAGNOSIS — Z7902 Long term (current) use of antithrombotics/antiplatelets: Secondary | ICD-10-CM | POA: Diagnosis not present

## 2016-04-05 DIAGNOSIS — Z951 Presence of aortocoronary bypass graft: Secondary | ICD-10-CM | POA: Insufficient documentation

## 2016-04-05 LAB — COMPREHENSIVE METABOLIC PANEL
ALT: 12 U/L — ABNORMAL LOW (ref 17–63)
ANION GAP: 11 (ref 5–15)
AST: 20 U/L (ref 15–41)
Albumin: 3.9 g/dL (ref 3.5–5.0)
Alkaline Phosphatase: 48 U/L (ref 38–126)
BUN: 9 mg/dL (ref 6–20)
CALCIUM: 9.6 mg/dL (ref 8.9–10.3)
CHLORIDE: 104 mmol/L (ref 101–111)
CO2: 26 mmol/L (ref 22–32)
Creatinine, Ser: 0.8 mg/dL (ref 0.61–1.24)
GFR calc non Af Amer: >60 mL/min (ref >60)
Glucose, Bld: 111 mg/dL — ABNORMAL HIGH (ref 65–99)
Potassium: 4.3 mmol/L (ref 3.5–5.1)
SODIUM: 141 mmol/L (ref 135–145)
TOTAL PROTEIN: 7 g/dL (ref 6.5–8.1)
Total Bilirubin: 0.8 mg/dL (ref 0.3–1.2)

## 2016-04-05 LAB — CBC
HEMATOCRIT: 38.5 % — AB (ref 39.0–52.0)
HEMATOCRIT: 37 % — AB (ref 39.0–52.0)
HEMOGLOBIN: 11.6 g/dL — AB (ref 13.0–17.0)
Hemoglobin: 11.7 g/dL — ABNORMAL LOW (ref 13.0–17.0)
MCH: 27.4 pg (ref 26.0–34.0)
MCH: 28.4 pg (ref 26.0–34.0)
MCHC: 30.1 g/dL (ref 30.0–36.0)
MCHC: 31.6 g/dL (ref 30.0–36.0)
MCV: 91 fL (ref 78.0–100.0)
MCV: 89.8 fL (ref 78.0–100.0)
PLATELETS: 260 10*3/uL (ref 150–400)
Platelets: 231 10*3/uL (ref 150–400)
RBC: 4.23 MIL/uL (ref 4.22–5.81)
RBC: 4.12 MIL/uL — AB (ref 4.22–5.81)
RDW: 15.9 % — ABNORMAL HIGH (ref 11.5–15.5)
RDW: 15.9 % — AB (ref 11.5–15.5)
WBC: 4.8 10*3/uL (ref 4.0–10.5)
WBC: 6.3 10*3/uL (ref 4.0–10.5)

## 2016-04-05 LAB — POC OCCULT BLOOD, ED: FECAL OCCULT BLD: POSITIVE — AB

## 2016-04-05 MED ORDER — PANTOPRAZOLE SODIUM 40 MG PO TBEC: 40.0000 mg | DELAYED_RELEASE_TABLET | Freq: Every day | ORAL | Status: DC

## 2016-04-05 NOTE — ED Notes (Signed)
Pt reports spitting up blood since Friday. Pt denies coughing up blood, states he can spit it out and blow it out his nose. Also having dark stools. Is currently taking plavix and has hx of GI bleed.

## 2016-04-05 NOTE — ED Notes (Signed)
Dr. Nanavati at the bedside.  

## 2016-04-05 NOTE — Telephone Encounter (Signed)
Spoke with patient's wife and he is having dark, tarry stools since Saturday. He also reports some bright, red blood in sputum. She reports his PCP has him on iron po for a low hemoglobin. He is a former Psychologist, counselling patient. Instructed patient's wife to take patient to ED for evaluation.

## 2016-04-05 NOTE — ED Provider Notes (Signed)
CSN: [id number]     Arrival date & time 04/05/16  1153 History   First MD Initiated Contact with Patient 04/05/16 1649     Chief Complaint  Patient presents with  . GI Problem  . Blood In Stools     (Consider location/radiation/quality/duration/timing/severity/associated sxs/prior Treatment) HPI Comments: Pt comes in with cc of GI bleed. Pt reports that for the last 3 days he has had dark, tarry stools, and he thinks there is blood in it. 1 bm/day. Pt today started having hemoptysis, and so he came to the ER, as he is concerned bout blood loss from 2 sources. Pt has had LGIB in the past and is s/p upper and lower endoscopies. Pt is on antiplatelet agents. He has no abd pain. PT has no emesis. His blood loss comes about when he clears his sinuses and throat and spits. No chest pain, dib, dizziness, fainting.   ROS 10 Systems reviewed and are negative for acute change except as noted in the HPI.     Patient is a 82 y.o. male presenting with GI illness. The history is provided by the patient.  GI Problem    Past Medical History  Diagnosis Date  . ALLERGIC RHINITIS 05/04/2007  . CORONARY ARTERY DISEASE 05/04/2007  . DIVERTICULOSIS, COLON 08/14/2009  . ESOPHAGEAL STRICTURE 08/14/2009  . GERD 05/04/2007  . HIATAL HERNIA 08/14/2009  . HYPERLIPIDEMIA 05/04/2007  . HYPERTENSION 05/04/2007  . Osteoarth NOS-Unspec 05/04/2007  . PEPTIC ULCER DISEASE 05/04/2007    bleeding ulcer with hospitalization  . DJD (degenerative joint disease)   . Myocardial infarction ([facility]) 1987  . Torn rotator cuff     right  . Internal hemorrhoids   . BPH (benign prostatic hyperplasia)   . Bleeding ulcer 1985  . Bruises easily    Past Surgical History  Procedure Laterality Date  . Cataract extraction Bilateral   . Cardiac catheterization  06/12/2007    EF 45%  . Cardiac catheterization  01/18/2005    EF 50-55%  . Cardiac catheterization  01/14/2003    EF 55%  . US echocardiography  08/23/2005    EF 50-55%  .  Cardiovascular stress test  02/07/2001  . Tonsillectomy    . Shoulder arthroscopy  2013    right  . Shoulder arthroscopy with rotator cuff repair and subacromial decompression Left 05/14/2014    Procedure: LEFT SHOULDER ARTHROSCOPY WITH DEBRIDEMENT EXTENSIVE, DISAL CLAVICULECTOMY, SUBACROMIAL DECOMPRESSION PARTIAL ACROMIOPLASTY WITH CORACOACROMIAL RELEASE AND ROTATOR CUFF REPAIR;  Surgeon: Lorn Junes, MD;  Location: [facility];  Service: Orthopedics;  Laterality: Left;  Marland Kitchen Eye surgery    . Coronary artery bypass graft      X 4  . Back surgery  05/05/2015    Spinal Stenosis, Rod and 2 screws placed in spine  . Coronary angioplasty with stent placement      1998  . Coronary angioplasty      1987  . Colonoscopy    . Upper gi endoscopy      X 3  . Lumbar laminectomy/decompression microdiscectomy Right 10/15/2015    Procedure: LUMBAR THREE-FOUR LUMBAR LAMINECTOMY/DECOMPRESSION MICRODISCECTOMY ;  Surgeon: Newman Pies, MD;  Location: [facility];  Service: Neurosurgery;  Laterality: Right;  Right L34 microdiskectomy   Family History  Problem Relation Age of Onset  . Heart attack Mother   . Heart attack Father   . Prostate cancer Brother   . Colon cancer Neg Hx    Social History  Substance Use Topics  .  Smoking status: Former Smoker -- 4.00 packs/day for 35 years    Quit date: 11/29/1985  . Smokeless tobacco: Never Used  . Alcohol Use: 4.2 oz/week    7 Shots of liquor per week     Comment: occasional    Review of Systems    Allergies  Statins; Crestor; and Latex  Home Medications   Prior to Admission medications   Medication Sig Start Date End Date Taking? Authorizing Provider  ALPHAGAN P 0.1 % SOLN Place 1 drop into the right eye 2 (two) times daily.  04/03/13   Historical Provider, MD  aspirin 81 MG tablet Take 81 mg by mouth daily.      Historical Provider, MD  calcium-vitamin D (OSCAL WITH D) 500-200 MG-UNIT per tablet Take 2 tablets by mouth daily  with breakfast.    Historical Provider, MD  celecoxib (CELEBREX) 200 MG capsule TAKE 1 CAPSULE BY MOUTH 2 TIMES A DAY 01/09/16   Marletta Lor, MD  clopidogrel (PLAVIX) 75 MG tablet TAKE 1 TABLET BY MOUTH DAILY 05/21/15   Marletta Lor, MD  docusate sodium (COLACE) 100 MG capsule Take 100 mg by mouth 2 (two) times daily.    Historical Provider, MD  esomeprazole (NEXIUM) 40 MG capsule Take 1 tab twice daily. 04/06/16   Amy S Esterwood, PA-C  gabapentin (NEURONTIN) 100 MG capsule Take 100 mg by mouth at bedtime.    Historical Provider, MD  gabapentin (NEURONTIN) 300 MG capsule Take 1 capsule (300 mg total) by mouth 3 (three) times daily. Patient taking differently: Take 300 mg by mouth every morning.  10/17/15   Newman Pies, MD  IRON PO Take 1 tablet by mouth daily.    Historical Provider, MD  loratadine (CLARITIN) 10 MG tablet Take 10 mg by mouth daily.      Historical Provider, MD  Multiple Vitamins-Minerals (PRESERVISION/LUTEIN PO) Take 1 tablet by mouth 2 (two) times daily.     Historical Provider, MD  Na Sulfate-K Sulfate-Mg Sulf SOLN Take 1 kit by mouth once. 04/06/16 05/06/16  Amy S Esterwood, PA-C  predniSONE (DELTASONE) 5 MG tablet TAKE 1 TABLET BY MOUTH DAILY 06/23/15   Marletta Lor, MD  tamsulosin (FLOMAX) 0.4 MG CAPS capsule TAKE 2 CAPSULES (0.8 MG TOTAL) BY MOUTH DAILY. 10/27/15   Marletta Lor, MD  temazepam (RESTORIL) 30 MG capsule TAKE ONE CAPSULE AT BEDTIME 02/16/16   Marletta Lor, MD   BP 138/76 mmHg  Pulse 73  Temp(Src) 98.2 F (36.8 C) (Oral)  Resp 16  SpO2 95% Physical Exam  Constitutional: He is oriented to person, place, and time. He appears well-developed.  HENT:  Head: Atraumatic.  Neck: Neck supple.  Cardiovascular: Normal rate.   Pulmonary/Chest: Effort normal. No respiratory distress. He has no wheezes.  Pt cleared his throat and spit out saliva with blood mixed in it  Abdominal:  guaic +, dark green stools  Neurological: He is alert  and oriented to person, place, and time.  Skin: Skin is warm.  Nursing note and vitals reviewed.   ED Course  Procedures (including critical care time) Labs Review Labs Reviewed  COMPREHENSIVE METABOLIC PANEL - Abnormal; Notable for the following:    Glucose, Bld 111 (*)    ALT 12 (*)    All other components within normal limits  CBC - Abnormal; Notable for the following:    Hemoglobin 11.6 (*)    HCT 38.5 (*)    RDW 15.9 (*)    All other  components within normal limits  CBC - Abnormal; Notable for the following:    RBC 4.12 (*)    Hemoglobin 11.7 (*)    HCT 37.0 (*)    RDW 15.9 (*)    All other components within normal limits  POC OCCULT BLOOD, ED - Abnormal; Notable for the following:    Fecal Occult Bld POSITIVE (*)    All other components within normal limits    Imaging Review Dg Chest 2 View  04/05/2016  CLINICAL DATA:  Hemoptysis. EXAM: CHEST  2 VIEW COMPARISON:  04/30/2015 FINDINGS: Previous median sternotomy and CABG procedure. Heart size is normal. No pleural effusion or edema. Chronic left posterior rib fracture deformities are noted involving the left fifth, sixth and seventh ribs. IMPRESSION: 1. No acute cardiopulmonary abnormalities. Electronically Signed   By: Kerby Moors M.D.   On: 04/05/2016 19:59   I have personally reviewed and evaluated these images and lab results as part of my medical decision-making.   EKG Interpretation None      MDM   Final diagnoses:  Hemoptysis  Melena  Acute recurrent frontal sinusitis    Pt comes in with cc of GI bleed. He has dark stools. Also on iron. Pt is on antiplatelet agent and nsaids -so he has risk for upper gi bleed, but he had a negative upper endoscopy last year. Colonoscopy from few years back showed diverticular dz.  Hb here is baseline normal. BUN is normal as well. Repeat CBC - unchanged Hb.  We think pt is likely having hemoptysis - WHICH might be contributing to the darkening of the stool. He has  no pulmonary problems, and is reporting sinus issues - so ENT f/u provided. Spoke with Dr. Derrill Memo, GI, who will see the patient as ab out patient from the GI side.  Results discussed. Pt given the option for obs admission with serial CBC vs. Outpatient f/u - and return to the ER if the symptoms get worse. He prefers outpatient f/u. Will d/c.  Strict ER return precautions have been discussed, and patient is agreeing with the plan and is comfortable with the workup done and the recommendations from the ER.      Varney Biles, MD 04/06/16 [phone]

## 2016-04-05 NOTE — Discharge Instructions (Signed)
We saw you in the ER for the bleeding. All the results in the ER are reassuring.. We are not sure what is causing your symptoms. The dark stools could be coming from the stomach/ GI system or from the sinuses or lungs. Thus we want you to see the GI doctor and ENT doctor.  The workup in the ER is not complete, and is limited to screening for life threatening and emergent conditions only, so please see a primary care doctor in 3 days for repeat blood work.  Please return to the ER if your symptoms worsen; you have increased bleeding (multiple bloody/ tarry stools), or dizziness, chest pain, shortness of breath, fainting, or if your doctor asks you to come to the ER.   Gastrointestinal Bleeding Gastrointestinal (GI) bleeding means there is bleeding somewhere along the digestive tract, between the mouth and anus. CAUSES  There are many different problems that can cause GI bleeding. Possible causes include:  Esophagitis. This is inflammation, irritation, or swelling of the esophagus.  Hemorrhoids.These are veins that are full of blood (engorged) in the rectum. They cause pain, inflammation, and may bleed.  Anal fissures.These are areas of painful tearing which may bleed. They are often caused by passing hard stool.  Diverticulosis.These are pouches that form on the colon over time, with age, and may bleed significantly.  Diverticulitis.This is inflammation in areas with diverticulosis. It can cause pain, fever, and bloody stools, although bleeding is rare.  Polyps and cancer. Colon cancer often starts out as precancerous polyps.  Gastritis and ulcers.Bleeding from the upper gastrointestinal tract (near the stomach) may travel through the intestines and produce black, sometimes tarry, often bad smelling stools. In certain cases, if the bleeding is fast enough, the stools may not be black, but red. This condition may be life-threatening. SYMPTOMS   Vomiting bright red blood or material  that looks like coffee grounds.  Bloody, black, or tarry stools. DIAGNOSIS  Your caregiver may diagnose your condition by taking your history and performing a physical exam. More tests may be needed, including:  X-rays and other imaging tests.  Esophagogastroduodenoscopy (EGD). This test uses a flexible, lighted tube to look at your esophagus, stomach, and small intestine.  Colonoscopy. This test uses a flexible, lighted tube to look at your colon. TREATMENT  Treatment depends on the cause of your bleeding.   For bleeding from the esophagus, stomach, small intestine, or colon, the caregiver doing your EGD or colonoscopy may be able to stop the bleeding as part of the procedure.  Inflammation or infection of the colon can be treated with medicines.  Many rectal problems can be treated with creams, suppositories, or warm baths.  Surgery is sometimes needed.  Blood transfusions are sometimes needed if you have lost a lot of blood. If bleeding is slow, you may be allowed to go home. If there is a lot of bleeding, you will need to stay in the hospital for observation. HOME CARE INSTRUCTIONS   Take any medicines exactly as prescribed.  Keep your stools soft by eating foods that are high in fiber. These foods include whole grains, legumes, fruits, and vegetables. Prunes (1 to 3 a day) work well for many people.  Drink enough fluids to keep your urine clear or pale yellow. SEEK IMMEDIATE MEDICAL CARE IF:   Your bleeding increases.  You feel lightheaded, weak, or you faint.  You have severe cramps in your back or abdomen.  You pass large blood clots in your stool.  Your problems are getting worse. MAKE SURE YOU:   Understand these instructions.  Will watch your condition.  Will get help right away if you are not doing well or get worse.   This information is not intended to replace advice given to you by your health care provider. Make sure you discuss any questions you have  with your health care provider.   Document Released: 11/12/2000 Document Revised: 11/01/2012 Document Reviewed: 05/05/2015 Elsevier Interactive Patient Education 2016 [employer].  Hemoptysis Hemoptysis, which means coughing up blood, can be a sign of a minor problem or a serious medical condition. The blood that is coughed up may come from the lungs and airways. Coughed-up blood can also come from bleeding that occurs outside the lungs and airways. Blood can drain into the windpipe during a severe nosebleed or when blood is vomited from the stomach. Because hemoptysis can be a sign of something serious, a medical evaluation is required. For some people with hemoptysis, no definite cause is ever identified. CAUSES  The most common cause of hemoptysis is bronchitis. Some other common causes include:   A ruptured blood vessel caused by coughing or an infection.   A medical condition that causes damage to the large air passageways (bronchiectasis).   A blood clot in the lungs (pulmonary embolism).   Pneumonia.   Tuberculosis.   Breathing in a small foreign object.   Cancer. For some people with hemoptysis, no definite cause is ever identified.  HOME CARE INSTRUCTIONS  Only take over-the-counter or prescription medicines as directed by your caregiver. Do not use cough suppressants unless your caregiver approves.  If your caregiver prescribes antibiotic medicines, take them as directed. Finish them even if you start to feel better.  Do not smoke. Also avoid secondhand smoke.  Follow up with your caregiver as directed. SEEK IMMEDIATE MEDICAL CARE IF:   You cough up bloody mucus for longer than a week.  You have a blood-producing cough that is severe or getting worse.  You have a blood-producing cough thatcomes and goes over time.  You develop problems with your breathing.   You vomit blood.  You develop bloody or black-colored stools.  You have chest pain.   You  develop night sweats.  You feel faint or pass out.   You have a fever or persistent symptoms for more than 2-3 days.  You have a fever and your symptoms suddenly get worse. MAKE SURE YOU:  Understand these instructions.  Will watch your condition.  Will get help right away if you are not doing well or get worse.   This information is not intended to replace advice given to you by your health care provider. Make sure you discuss any questions you have with your health care provider.   Document Released: 01/24/2002 Document Revised: 11/01/2012 Document Reviewed: 09/01/2012 Elsevier Interactive Patient Education 2016 [employer]  Sinusitis, Adult Sinusitis is redness, soreness, and inflammation of the paranasal sinuses. Paranasal sinuses are air pockets within the bones of your face. They are located beneath your eyes, in the middle of your forehead, and above your eyes. In healthy paranasal sinuses, mucus is able to drain out, and air is able to circulate through them by way of your nose. However, when your paranasal sinuses are inflamed, mucus and air can become trapped. This can allow bacteria and other germs to grow and cause infection. Sinusitis can develop quickly and last only a short time (acute) or continue over a long period (chronic). Sinusitis  that lasts for more than 12 weeks is considered chronic. CAUSES Causes of sinusitis include:  Allergies.  Structural abnormalities, such as displacement of the cartilage that separates your nostrils (deviated septum), which can decrease the air flow through your nose and sinuses and affect sinus drainage.  Functional abnormalities, such as when the small hairs (cilia) that line your sinuses and help remove mucus do not work properly or are not present. SIGNS AND SYMPTOMS Symptoms of acute and chronic sinusitis are the same. The primary symptoms are pain and pressure around the affected sinuses. Other symptoms include:  Upper  toothache.  Earache.  Headache.  Bad breath.  Decreased sense of smell and taste.  A cough, which worsens when you are lying flat.  Fatigue.  Fever.  Thick drainage from your nose, which often is green and may contain pus (purulent).  Swelling and warmth over the affected sinuses. DIAGNOSIS Your health care provider will perform a physical exam. During your exam, your health care provider may perform any of the following to help determine if you have acute sinusitis or chronic sinusitis:  Look in your nose for signs of abnormal growths in your nostrils (nasal polyps).  Tap over the affected sinus to check for signs of infection.  View the inside of your sinuses using an imaging device that has a light attached (endoscope). If your health care provider suspects that you have chronic sinusitis, one or more of the following tests may be recommended:  Allergy tests.  Nasal culture. A sample of mucus is taken from your nose, sent to a lab, and screened for bacteria.  Nasal cytology. A sample of mucus is taken from your nose and examined by your health care provider to determine if your sinusitis is related to an allergy. TREATMENT Most cases of acute sinusitis are related to a viral infection and will resolve on their own within 10 days. Sometimes, medicines are prescribed to help relieve symptoms of both acute and chronic sinusitis. These may include pain medicines, decongestants, nasal steroid sprays, or saline sprays. However, for sinusitis related to a bacterial infection, your health care provider will prescribe antibiotic medicines. These are medicines that will help kill the bacteria causing the infection. Rarely, sinusitis is caused by a fungal infection. In these cases, your health care provider will prescribe antifungal medicine. For some cases of chronic sinusitis, surgery is needed. Generally, these are cases in which sinusitis recurs more than 3 times per year, despite  other treatments. HOME CARE INSTRUCTIONS  Drink plenty of water. Water helps thin the mucus so your sinuses can drain more easily.  Use a humidifier.  Inhale steam 3-4 times a day (for example, sit in the bathroom with the shower running).  Apply a warm, moist washcloth to your face 3-4 times a day, or as directed by your health care provider.  Use saline nasal sprays to help moisten and clean your sinuses.  Take medicines only as directed by your health care provider.  If you were prescribed either an antibiotic or antifungal medicine, finish it all even if you start to feel better. SEEK IMMEDIATE MEDICAL CARE IF:  You have increasing pain or severe headaches.  You have nausea, vomiting, or drowsiness.  You have swelling around your face.  You have vision problems.  You have a stiff neck.  You have difficulty breathing.   This information is not intended to replace advice given to you by your health care provider. Make sure you discuss any questions you  have with your health care provider.   Document Released: 11/15/2005 Document Revised: 12/06/2014 Document Reviewed: 11/30/2011 Elsevier Interactive Patient Education [employer]

## 2016-04-05 NOTE — ED Notes (Signed)
Patient transported to X-ray 

## 2016-04-06 ENCOUNTER — Encounter: Payer: Self-pay | Admitting: Physician Assistant

## 2016-04-06 ENCOUNTER — Telehealth: Payer: Self-pay | Admitting: *Deleted

## 2016-04-06 ENCOUNTER — Encounter: Payer: Self-pay | Admitting: Gastroenterology

## 2016-04-06 ENCOUNTER — Other Ambulatory Visit (INDEPENDENT_AMBULATORY_CARE_PROVIDER_SITE_OTHER): Payer: Medicare Other

## 2016-04-06 ENCOUNTER — Ambulatory Visit (INDEPENDENT_AMBULATORY_CARE_PROVIDER_SITE_OTHER): Payer: Medicare Other | Admitting: Physician Assistant

## 2016-04-06 ENCOUNTER — Telehealth: Payer: Self-pay | Admitting: Internal Medicine

## 2016-04-06 VITALS — BP 116/70 | HR 88 | Ht 70.0 in | Wt 148.0 lb

## 2016-04-06 DIAGNOSIS — K279 Peptic ulcer, site unspecified, unspecified as acute or chronic, without hemorrhage or perforation: Secondary | ICD-10-CM

## 2016-04-06 DIAGNOSIS — R195 Other fecal abnormalities: Secondary | ICD-10-CM | POA: Diagnosis not present

## 2016-04-06 DIAGNOSIS — D62 Acute posthemorrhagic anemia: Secondary | ICD-10-CM | POA: Diagnosis not present

## 2016-04-06 LAB — CBC WITH DIFFERENTIAL/PLATELET
BASOS ABS: 0 10*3/uL (ref 0.0–0.1)
Basophils Relative: 0.3 % (ref 0.0–3.0)
EOS ABS: 0.2 10*3/uL (ref 0.0–0.7)
Eosinophils Relative: 2.5 % (ref 0.0–5.0)
HEMATOCRIT: 35 % — AB (ref 39.0–52.0)
Hemoglobin: 11.2 g/dL — ABNORMAL LOW (ref 13.0–17.0)
LYMPHS PCT: 19.2 % (ref 12.0–46.0)
Lymphs Abs: 1.2 10*3/uL (ref 0.7–4.0)
MCHC: 32 g/dL (ref 30.0–36.0)
MCV: 88 fl (ref 78.0–100.0)
MONO ABS: 0.6 10*3/uL (ref 0.1–1.0)
Monocytes Relative: 10.3 % (ref 3.0–12.0)
NEUTROS ABS: 4.1 10*3/uL (ref 1.4–7.7)
Neutrophils Relative %: 67.7 % (ref 43.0–77.0)
PLATELETS: 262 10*3/uL (ref 150.0–400.0)
RBC: 3.98 Mil/uL — ABNORMAL LOW (ref 4.22–5.81)
RDW: 17.4 % — ABNORMAL HIGH (ref 11.5–15.5)
WBC: 6.1 10*3/uL (ref 4.0–10.5)

## 2016-04-06 MED ORDER — NA SULFATE-K SULFATE-MG SULF 17.5-3.13-1.6 GM/177ML PO SOLN: 1.0000 | Freq: Once | ORAL | Status: DC

## 2016-04-06 MED ORDER — ESOMEPRAZOLE MAGNESIUM 40 MG PO CPDR: DELAYED_RELEASE_CAPSULE | ORAL | Status: DC

## 2016-04-06 NOTE — Patient Instructions (Addendum)
Please go to the basement level to have your labs drawn.  Increase the Nexium 40 mg to twice daily.   Stop the Celebrex.   You have been scheduled for an endoscopy and colonoscopy. Please follow the written instructions given to you at your visit today. Please pick up your prep supplies at the pharmacy within the next 1-3 days. If you use inhalers (even only as needed), please bring them with you on the day of your procedure. Your physician has requested that you go to www.startemmi.com and enter the access code given to you at your visit today. This web site gives a general overview about your procedure. However, you should still follow specific instructions given to you by our office regarding your preparation for the procedure.           If you are age 7 or older, your body mass index should be between 23-30. Your Body mass index is 21.24 kg/(m^2). If this is out of the aforementioned range listed, please consider follow up with your Primary Care Provider.

## 2016-04-06 NOTE — Telephone Encounter (Signed)
Scheduled patient with Mike Gip, PA today at 2:30 PM.

## 2016-04-06 NOTE — Telephone Encounter (Signed)
04/06/2016   RE: ZAKI PLASKY DOB: [birth year] MRN: [id number]   Dear Dr. Leodis Sias,    We have scheduled the above patient for an endoscopic procedure. Our records show that he is on anticoagulation therapy.   We have scheduled this patient for a colonoscopy and endoscopy for 04-12-2016. We have advised him to stay off the Plavix until after the procedures. We wanted to inform you of this.   Please contact me if you have any concerns or advise.   Sincerely,   Amy Esterwood PA-C

## 2016-04-06 NOTE — Progress Notes (Signed)
Patient ID: ADOLPHE FORTUNATO, male   DOB: 1933-08-07, 82 y.o.   MRN: [id number]   Subjective:    Patient ID: Kaylyn Layer, male    DOB: Aug 22, 1933, 82 y.o.   MRN: [id number]  HPI Michai is a pleasant 82 year old white male previously known to Dr. Delfin Edis who comes in today after an ER visit yesterday with complaint of black stool. Patient says he started noticing very dark stools about 4 days ago and has just been having 1 bowel movement per day. He has been on an iron supplement over the past several months but says his stools are darker than normal. He was documented to be Hemoccult positive and labs yesterday showed hemoglobin 11.6 hematocrit of 38.5. Review of his records shows his hemoglobin was 12.5 in November 2016. He is on a combination of baby aspirin, Plavix, Celebrex and prednisone. He has history of remote peptic ulcer disease in 1995. He last had EGD in February 2016 per Dr. Olevia Perches which showed a benign distal esophageal stricture which was dilated, last colonoscopy done in October 2010 showed moderate diverticulosis, internal hemorrhoids and there was a tiny polyp in the cecum which was not removed as could not be located on reinspection. Patient says he's had some very minimal abdominal discomfort over the past couple of weeks appetite has been fine and weight has been staying stable no current complaints of dysphagia or odynophagia. The bright red blood. Patient has history of coronary artery disease is status post remote stents and CABG in 1999, also status post MI. He has significant osteoarthritis for which she takes the Celebrex and prednisone and had a laminectomy done in November 2016. Patient also mentioned that he had spit out some sinus drainage last Friday which looked very dark as well. He did not cough this up and did not vomit it up. He has appointment to see an ENT.  Review of Systems Pertinent positive and negative review of systems were noted in the above HPI section.  All  other review of systems was otherwise negative.  Outpatient Encounter Prescriptions as of 04/06/2016  Medication Sig  . ALPHAGAN P 0.1 % SOLN Place 1 drop into the right eye 2 (two) times daily.   Marland Kitchen aspirin 81 MG tablet Take 81 mg by mouth daily.    . calcium-vitamin D (OSCAL WITH D) 500-200 MG-UNIT per tablet Take 2 tablets by mouth daily with breakfast.  . celecoxib (CELEBREX) 200 MG capsule TAKE 1 CAPSULE BY MOUTH 2 TIMES A DAY  . clopidogrel (PLAVIX) 75 MG tablet TAKE 1 TABLET BY MOUTH DAILY  . docusate sodium (COLACE) 100 MG capsule Take 100 mg by mouth 2 (two) times daily.  Marland Kitchen esomeprazole (NEXIUM) 40 MG capsule Take 1 tab twice daily.  Marland Kitchen gabapentin (NEURONTIN) 100 MG capsule Take 100 mg by mouth at bedtime.  . gabapentin (NEURONTIN) 300 MG capsule Take 1 capsule (300 mg total) by mouth 3 (three) times daily. (Patient taking differently: Take 300 mg by mouth every morning. )  . IRON PO Take 1 tablet by mouth daily.  Marland Kitchen loratadine (CLARITIN) 10 MG tablet Take 10 mg by mouth daily.    . Multiple Vitamins-Minerals (PRESERVISION/LUTEIN PO) Take 1 tablet by mouth 2 (two) times daily.   . predniSONE (DELTASONE) 5 MG tablet TAKE 1 TABLET BY MOUTH DAILY  . tamsulosin (FLOMAX) 0.4 MG CAPS capsule TAKE 2 CAPSULES (0.8 MG TOTAL) BY MOUTH DAILY.  Marland Kitchen temazepam (RESTORIL) 30 MG capsule TAKE ONE CAPSULE AT BEDTIME  . [  DISCONTINUED] NEXIUM 40 MG capsule TAKE 1 CAPSULE (40 MG TOTAL) BY MOUTH DAILY BEFORE BREAKFAST.  . Na Sulfate-K Sulfate-Mg Sulf SOLN Take 1 kit by mouth once.  . [DISCONTINUED] pantoprazole (PROTONIX) 40 MG tablet Take 1 tablet (40 mg total) by mouth daily.  . [DISCONTINUED] traMADol (ULTRAM) 50 MG tablet Take 1-2 tablets (50-100 mg total) by mouth every 6 (six) hours as needed for moderate pain (1-2 tablets q6hrs prn).   No facility-administered encounter medications on file as of 04/06/2016.   Allergies  Allergen Reactions  . Statins Other (See Comments)    Muscle aches  . Crestor  [Rosuvastatin Calcium]     Aches   . Latex Other (See Comments)    Makes skin red & causes irritation   Patient Active Problem List   Diagnosis Date Noted  . Lumbar herniated disc 10/15/2015  . Spondylolisthesis of lumbar region 05/05/2015  . Rotator cuff tear, left surgery 05/14/2014 05/14/2014  . Myocardial infarction ([place])   . Torn rotator cuff, right surgery 2014   . DJD (degenerative joint disease)   . Lumbar spinal stenosis 05/07/2011  . Cervical pain 05/07/2011  . ESOPHAGEAL STRICTURE 08/14/2009  . HIATAL HERNIA 08/14/2009  . DIVERTICULOSIS, COLON 08/14/2009  . Hyperlipidemia 05/04/2007  . Essential hypertension 05/04/2007  . CAD (coronary artery disease) 05/04/2007  . ALLERGIC RHINITIS 05/04/2007  . GERD 05/04/2007  . Peptic ulcer 05/04/2007  . Osteoarthritis 05/04/2007   Social History   Social History  . Marital Status: Married    Spouse Name: N/A  . Number of Children: N/A  . Years of Education: N/A   Occupational History  . Not on file.   Social History Main Topics  . Smoking status: Former Smoker -- 4.00 packs/day for 35 years    Quit date: 11/29/1985  . Smokeless tobacco: Never Used  . Alcohol Use: 4.2 oz/week    7 Shots of liquor per week     Comment: occasional  . Drug Use: No  . Sexual Activity: Not on file   Other Topics Concern  . Not on file   Social History Narrative    Mr. Coulthard's family history includes Heart attack in his father and mother; Prostate cancer in his brother. There is no history of Colon cancer.      Objective:    Filed Vitals:   04/06/16 1430  BP: 116/70  Pulse: 88    Physical Exam  well-developed elderly white male in no acute distress, pleasant accompanied by his wife blood pressure 116/70 pulse 88 height 5 foot 10 weight 148. HEENT; nontraumatic normocephalic EOMI PERRLA sclera anicteric, Cardiovascular ;regular rate and rhythm with S1-S2 no murmur or gallop, sternal incisional scar  Pulmonary; clear bilaterally  Abdomen; soft, nontender nondistended bowel sounds are active there is no palpable mass or hepatosplenomegaly, Rectal; exam not done this was done in the ER yesterday and documented dark stool heme positive, Ext; no clubbing cyanosis or edema he skin warm dry, Neuropsych; mood and affect appropriate     Assessment & Plan:   #1 82 yo White male with 4 day history of dark stool, documented Hemoccult-positive and with mild normocytic anemia. This is in the setting of aspirin and Plavix and Celebrex. Rule out peptic ulcer disease or aspirin/NSAID-induced gastropathy. Rule out occult colon lesion #2 coronary artery disease status post remote stents remote CABG and MI #3 significant osteoarthritis #4 status post lumbar laminectomy November 2016 #5 remote history of peptic ulcer disease #6 history of distal esophageal  stricture with prior dilations  Plan; repeat CBC today, patient also has follow-up with Dr. Burnice Logan on Friday and CBC will be repeated then  Stop Celebrex Have scheduled patient for EGD and colonoscopy with Dr. Ardis Hughs. This will be done in one week and therefore will start holding Plavix now, hopefully this will decrease any significant bleeding in the interim. Both procedures were discussed with the patient and his wife and they are agreeable to proceed. We will also notify his cardiologist Dr. Kristopher Oppenheim  that  Plavix will be held for one week prior to endoscopic evaluation Increase Nexium to 40 mg by mouth twice a day Patient was advised to seek emergency room care should he have any significant increase in stool frequency, overt melena weakness fatigue etc.   [facility] Nurse Jodie Leiner PA-C 04/06/2016   Cc: Marletta Lor, MD

## 2016-04-06 NOTE — Telephone Encounter (Signed)
Spoke with patient's wife and gave her appointment date and time.

## 2016-04-07 NOTE — Progress Notes (Signed)
i agree with the above note, plan 

## 2016-04-07 NOTE — Telephone Encounter (Signed)
Dakota Green is at low risk for his procedure. He may hold his plavix for 5 days prior to procedure

## 2016-04-08 NOTE — Telephone Encounter (Signed)
Spoke to the patient advising that he can hold him Plavix starting today.  He can stay off the Plavix until after the procedure.  He did not take it today.  Patient verbalized understanding the Plavix instructions.

## 2016-04-09 ENCOUNTER — Encounter: Payer: Self-pay | Admitting: Internal Medicine

## 2016-04-09 ENCOUNTER — Ambulatory Visit (INDEPENDENT_AMBULATORY_CARE_PROVIDER_SITE_OTHER): Payer: Medicare Other | Admitting: Internal Medicine

## 2016-04-09 VITALS — BP 140/80 | HR 69 | Temp 98.5°F | Resp 18 | Ht 70.0 in | Wt 149.0 lb

## 2016-04-09 DIAGNOSIS — I10 Essential (primary) hypertension: Secondary | ICD-10-CM | POA: Diagnosis not present

## 2016-04-09 DIAGNOSIS — J3089 Other allergic rhinitis: Secondary | ICD-10-CM | POA: Diagnosis not present

## 2016-04-09 DIAGNOSIS — M159 Polyosteoarthritis, unspecified: Secondary | ICD-10-CM

## 2016-04-09 DIAGNOSIS — M150 Primary generalized (osteo)arthritis: Secondary | ICD-10-CM | POA: Diagnosis not present

## 2016-04-09 DIAGNOSIS — Z8719 Personal history of other diseases of the digestive system: Secondary | ICD-10-CM | POA: Diagnosis not present

## 2016-04-09 LAB — CBC WITH DIFFERENTIAL/PLATELET
Basophils Absolute: 0 10*3/uL (ref 0.0–0.1)
Basophils Relative: 0.4 % (ref 0.0–3.0)
EOS PCT: 2.4 % (ref 0.0–5.0)
Eosinophils Absolute: 0.1 10*3/uL (ref 0.0–0.7)
HEMATOCRIT: 34.8 % — AB (ref 39.0–52.0)
HEMOGLOBIN: 11.3 g/dL — AB (ref 13.0–17.0)
Lymphocytes Relative: 10.7 % — ABNORMAL LOW (ref 12.0–46.0)
Lymphs Abs: 0.6 10*3/uL — ABNORMAL LOW (ref 0.7–4.0)
MCHC: 32.6 g/dL (ref 30.0–36.0)
MCV: 88.3 fl (ref 78.0–100.0)
Monocytes Absolute: 0.4 10*3/uL (ref 0.1–1.0)
Monocytes Relative: 6 % (ref 3.0–12.0)
NEUTROS ABS: 4.9 10*3/uL (ref 1.4–7.7)
Neutrophils Relative %: 80.5 % — ABNORMAL HIGH (ref 43.0–77.0)
PLATELETS: 230 10*3/uL (ref 150.0–400.0)
RBC: 3.94 Mil/uL — ABNORMAL LOW (ref 4.22–5.81)
RDW: 16.9 % — ABNORMAL HIGH (ref 11.5–15.5)
WBC: 6 10*3/uL (ref 4.0–10.5)

## 2016-04-09 NOTE — Progress Notes (Signed)
Subjective:    Patient ID: Dakota Green, male    DOB: [birth year], 82 y.o.   MRN: [id number]  HPI  82 year old patient who is seen in the ED 4 days ago with melena.  He was seen and followed by GI.  3 days ago and is scheduled for a full GI workup on 5:15.  He also has a ENT follow-up on May 16. He has remote history of peptic ulcer disease and has been on DAPT as well as Celebrex and low-dose prednisone-latter 2 medications for inflammatory osteoarthritis.  He has not done well on Celebrex or prednisone alone. There is been a modest drop in hemoglobin.  No bowel movement the last 2 days. He does have a history of allergic rhinitis and has noticed some blood-tinged mucoid secretions Plavix has been on hold He generally feels well today  Past Medical History  Diagnosis Date  . ALLERGIC RHINITIS 05/04/2007  . CORONARY ARTERY DISEASE 05/04/2007  . DIVERTICULOSIS, COLON 08/14/2009  . ESOPHAGEAL STRICTURE 08/14/2009  . GERD 05/04/2007  . HIATAL HERNIA 08/14/2009  . HYPERLIPIDEMIA 05/04/2007  . HYPERTENSION 05/04/2007  . Osteoarth NOS-Unspec 05/04/2007  . PEPTIC ULCER DISEASE 05/04/2007    bleeding ulcer with hospitalization  . DJD (degenerative joint disease)   . Myocardial infarction ([place]) 1987  . Torn rotator cuff     right  . Internal hemorrhoids   . BPH (benign prostatic hyperplasia)   . Bleeding ulcer 1985  . Bruises easily      Social History   Social History  . Marital Status: Married    Spouse Name: N/A  . Number of Children: N/A  . Years of Education: N/A   Occupational History  . Not on file.   Social History Main Topics  . Smoking status: Former Smoker -- 4.00 packs/day for 35 years    Quit date: 11/29/1985  . Smokeless tobacco: Never Used  . Alcohol Use: 4.2 oz/week    7 Shots of liquor per week     Comment: occasional  . Drug Use: No  . Sexual Activity: Not on file   Other Topics Concern  . Not on file   Social History Narrative    Past Surgical History    Procedure Laterality Date  . Cataract extraction Bilateral   . Cardiac catheterization  06/12/2007    EF 45%  . Cardiac catheterization  01/18/2005    EF 50-55%  . Cardiac catheterization  01/14/2003    EF 55%  . US echocardiography  08/23/2005    EF 50-55%  . Cardiovascular stress test  02/07/2001  . Tonsillectomy    . Shoulder arthroscopy  2013    right  . Shoulder arthroscopy with rotator cuff repair and subacromial decompression Left 05/14/2014    Procedure: LEFT SHOULDER ARTHROSCOPY WITH DEBRIDEMENT EXTENSIVE, DISAL CLAVICULECTOMY, SUBACROMIAL DECOMPRESSION PARTIAL ACROMIOPLASTY WITH CORACOACROMIAL RELEASE AND ROTATOR CUFF REPAIR;  Surgeon: Lorn Junes, MD;  Location: [facility];  Service: Orthopedics;  Laterality: Left;  Marland Kitchen Eye surgery    . Coronary artery bypass graft      X 4  . Back surgery  05/05/2015    Spinal Stenosis, Rod and 2 screws placed in spine  . Coronary angioplasty with stent placement      1998  . Coronary angioplasty      1987  . Colonoscopy    . Upper gi endoscopy      X 3  . Lumbar laminectomy/decompression microdiscectomy Right 10/15/2015  Procedure: LUMBAR THREE-FOUR LUMBAR LAMINECTOMY/DECOMPRESSION MICRODISCECTOMY ;  Surgeon: Newman Pies, MD;  Location: [facility];  Service: Neurosurgery;  Laterality: Right;  Right L34 microdiskectomy    Family History  Problem Relation Age of Onset  . Heart attack Mother   . Heart attack Father   . Prostate cancer Brother   . Colon cancer Neg Hx     Allergies  Allergen Reactions  . Statins Other (See Comments)    Muscle aches  . Crestor [Rosuvastatin Calcium]     Aches   . Latex Other (See Comments)    Makes skin red & causes irritation    Current Outpatient Prescriptions on File Prior to Visit  Medication Sig Dispense Refill  . ALPHAGAN P 0.1 % SOLN Place 1 drop into the right eye 2 (two) times daily.     Marland Kitchen aspirin 81 MG tablet Take 81 mg by mouth daily.      . calcium-vitamin D  (OSCAL WITH D) 500-200 MG-UNIT per tablet Take 2 tablets by mouth daily with breakfast.    . celecoxib (CELEBREX) 200 MG capsule TAKE 1 CAPSULE BY MOUTH 2 TIMES A DAY 60 capsule 2  . clopidogrel (PLAVIX) 75 MG tablet TAKE 1 TABLET BY MOUTH DAILY 90 tablet 1  . docusate sodium (COLACE) 100 MG capsule Take 100 mg by mouth 2 (two) times daily.    Marland Kitchen esomeprazole (NEXIUM) 40 MG capsule Take 1 tab twice daily. 60 capsule 4  . gabapentin (NEURONTIN) 100 MG capsule Take 100 mg by mouth at bedtime.    . gabapentin (NEURONTIN) 300 MG capsule Take 1 capsule (300 mg total) by mouth 3 (three) times daily. (Patient taking differently: Take 300 mg by mouth every morning. ) 60 capsule 1  . IRON PO Take 1 tablet by mouth daily.    Marland Kitchen loratadine (CLARITIN) 10 MG tablet Take 10 mg by mouth daily.      . Multiple Vitamins-Minerals (PRESERVISION/LUTEIN PO) Take 1 tablet by mouth 2 (two) times daily.     . Na Sulfate-K Sulfate-Mg Sulf SOLN Take 1 kit by mouth once. 354 mL 0  . predniSONE (DELTASONE) 5 MG tablet TAKE 1 TABLET BY MOUTH DAILY 90 tablet 1  . tamsulosin (FLOMAX) 0.4 MG CAPS capsule TAKE 2 CAPSULES (0.8 MG TOTAL) BY MOUTH DAILY. 180 capsule 3  . temazepam (RESTORIL) 30 MG capsule TAKE ONE CAPSULE AT BEDTIME 30 capsule 2   No current facility-administered medications on file prior to visit.    BP 140/80 mmHg  Pulse 69  Temp(Src) 98.5 F (36.9 C) (Oral)  Resp 18  Ht _0  (1.778 m)  Wt 149 lb (67.586 kg)  BMI 21.38 kg/m2  SpO2 98%     Review of Systems  Constitutional: Negative for fever, chills, appetite change and fatigue.  HENT: Positive for congestion, nosebleeds and postnasal drip. Negative for dental problem, ear pain, hearing loss, sore throat, tinnitus, trouble swallowing and voice change.   Eyes: Negative for pain, discharge and visual disturbance.  Respiratory: Positive for cough. Negative for chest tightness, wheezing and stridor.   Cardiovascular: Negative for chest pain,  palpitations and leg swelling.  Gastrointestinal: Positive for blood in stool. Negative for nausea, vomiting, abdominal pain, diarrhea, constipation and abdominal distention.  Genitourinary: Negative for urgency, hematuria, flank pain, discharge, difficulty urinating and genital sores.  Musculoskeletal: Negative for myalgias, back pain, joint swelling, arthralgias, gait problem and neck stiffness.  Skin: Negative for rash.  Neurological: Negative for dizziness, syncope, speech difficulty, weakness,  numbness and headaches.  Hematological: Negative for adenopathy. Does not bruise/bleed easily.  Psychiatric/Behavioral: Negative for behavioral problems and dysphoric mood. The patient is not nervous/anxious.        Objective:   Physical Exam  Constitutional: He is oriented to person, place, and time. He appears well-developed.  HENT:  Head: Normocephalic.  Right Ear: External ear normal.  Left Ear: External ear normal.  Eyes: Conjunctivae and EOM are normal.  Neck: Normal range of motion.  Cardiovascular: Normal rate and normal heart sounds.   Sternotomy scar A few ectopics Grade 2/6 systolic murmur  Pulmonary/Chest: Breath sounds normal.  Abdominal: Soft. Bowel sounds are normal. He exhibits no distension. There is no tenderness. There is no rebound and no guarding.  Musculoskeletal: Normal range of motion. He exhibits no edema or tenderness.  Neurological: He is alert and oriented to person, place, and time.  Psychiatric: He has a normal mood and affect. His behavior is normal.          Assessment & Plan:   History of melena.  Will check CBC.  GI evaluation in 3 days.  Continue to hold Plavix History of optic ulcer and esophageal stricture Inflammatory osteoarthritis Allergic rhinitis Coronary artery disease.  Stable

## 2016-04-09 NOTE — Progress Notes (Signed)
Pre visit review using our clinic review tool, if applicable. No additional management support is needed unless otherwise documented below in the visit note. 

## 2016-04-09 NOTE — Patient Instructions (Signed)
GI follow-up and evaluation next week as scheduled ENT evaluation next week as scheduled  Call or return to clinic prn if these symptoms worsen or fail to improve as anticipated.

## 2016-04-12 ENCOUNTER — Ambulatory Visit (AMBULATORY_SURGERY_CENTER): Payer: Medicare Other | Admitting: Gastroenterology

## 2016-04-12 ENCOUNTER — Encounter: Payer: Self-pay | Admitting: Gastroenterology

## 2016-04-12 VITALS — BP 146/82 | HR 62 | Temp 97.8°F | Resp 12 | Ht 70.0 in | Wt 148.0 lb

## 2016-04-12 DIAGNOSIS — D649 Anemia, unspecified: Secondary | ICD-10-CM

## 2016-04-12 DIAGNOSIS — K3189 Other diseases of stomach and duodenum: Secondary | ICD-10-CM | POA: Diagnosis not present

## 2016-04-12 DIAGNOSIS — R195 Other fecal abnormalities: Secondary | ICD-10-CM | POA: Diagnosis not present

## 2016-04-12 DIAGNOSIS — K2970 Gastritis, unspecified, without bleeding: Secondary | ICD-10-CM | POA: Diagnosis not present

## 2016-04-12 MED ORDER — SODIUM CHLORIDE 0.9 % IV SOLN: 500.0000 mL | INTRAVENOUS | Status: DC

## 2016-04-12 NOTE — Op Note (Signed)
[facility] Patient Name: Dakota Green Procedure Date: 04/12/2016 1:13 PM MRN: [id number] Endoscopist: Rachael Fee , MD Age: 82 Referring MD:  Date of Birth: 04/09/1933 Gender: Male Procedure:                Upper GI endoscopy Indications:              Heme positive stool; mild anemia, dark stools Medicines:                Monitored Anesthesia Care Procedure:                Pre-Anesthesia Assessment:                           - Prior to the procedure, a History and Physical                            was performed, and patient medications and                            allergies were reviewed. The patient's tolerance of                            previous anesthesia was also reviewed. The risks                            and benefits of the procedure and the sedation                            options and risks were discussed with the patient.                            All questions were answered, and informed consent                            was obtained. Prior Anticoagulants: The patient has                            taken Plavix (clopidogrel), last dose was 5 days                            prior to procedure. ASA Grade Assessment: III - A                            patient with severe systemic disease. After                            reviewing the risks and benefits, the patient was                            deemed in satisfactory condition to undergo the                            procedure.  After obtaining informed consent, the endoscope was                            passed under direct vision. Throughout the                            procedure, the patient's blood pressure, pulse, and                            oxygen saturations were monitored continuously. The                            Model GIF-[id number]) scope was introduced                            through the mouth, and advanced to the second part            of duodenum. The upper GI endoscopy was                            accomplished without difficulty. The patient                            tolerated the procedure well. Scope In: Scope Out: Findings:                 The esophagus was normal.                           Diffuse moderate inflammation characterized by                            erythema, atrophy, friability was found in the                            entire examined stomach. Biopsies were taken with a                            cold forceps for histology.                           The examined duodenum was normal. Complications:            No immediate complications. Estimated blood loss:                            None. Estimated Blood Loss:     Estimated blood loss: none. Impression:               - Normal esophagus.                           - Gastritis. Biopsied.                           - Normal examined duodenum. Recommendation:           - Patient has a contact number available for  emergencies. The signs and symptoms of potential                            delayed complications were discussed with the                            patient. Return to normal activities tomorrow.                            Written discharge instructions were provided to the                            patient.                           - Resume previous diet.                           - Continue present medications. It does not clear                            that you have been having acute GI bleeding (Hb                            stable despite dark stools).                           - Continue with your plans for ENT evaluation                            tomorrow. If your ENT provider sees no bleeding                            source in ENT examination, you are safe to resume                            your plavix.                           - Await pathology results. Rachael Fee, MD 04/12/2016  1:50:00 PM This report has been signed electronically.

## 2016-04-12 NOTE — Progress Notes (Signed)
Called to room to assist during endoscopic procedure.  Patient ID and intended procedure confirmed with present staff. Received instructions for my participation in the procedure from the performing physician.  

## 2016-04-12 NOTE — Progress Notes (Signed)
Report to PACU, RN, vss, BBS= Clear.  

## 2016-04-12 NOTE — Op Note (Signed)
[facility] Patient Name: Dakota Green Procedure Date: 04/12/2016 1:14 PM MRN: [id number] Endoscopist: Rachael Fee , MD Age: 82 Referring MD:  Date of Birth: 09-10-1933 Gender: Male Procedure:                Colonoscopy Indications:              Heme positive stool; dark stools; Hb stable Medicines:                Monitored Anesthesia Care Procedure:                Pre-Anesthesia Assessment:                           - Prior to the procedure, a History and Physical                            was performed, and patient medications and                            allergies were reviewed. The patient's tolerance of                            previous anesthesia was also reviewed. The risks                            and benefits of the procedure and the sedation                            options and risks were discussed with the patient.                            All questions were answered, and informed consent                            was obtained. Prior Anticoagulants: The patient has                            taken Plavix (clopidogrel), last dose was 5 days                            prior to procedure. ASA Grade Assessment: III - A                            patient with severe systemic disease. After                            reviewing the risks and benefits, the patient was                            deemed in satisfactory condition to undergo the                            procedure.  After obtaining informed consent, the colonoscope                            was passed under direct vision. Throughout the                            procedure, the patient's blood pressure, pulse, and                            oxygen saturations were monitored continuously. The                            Model CF-HQ190L [id number]) scope was introduced                            through the anus and advanced to the the terminal                             ileum. The colonoscopy was performed without                            difficulty. The patient tolerated the procedure                            well. The quality of the bowel preparation was                            good. The terminal ileum, ileocecal valve,                            appendiceal orifice, and rectum were photographed. Scope In: 1:26:07 PM Scope Out: 1:33:05 PM Scope Withdrawal Time: 0 hours 4 minutes 22 seconds  Total Procedure Duration: 0 hours 6 minutes 58 seconds  Findings:                 Multiple small and large-mouthed diverticula were                            found in the entire colon.                           The exam was otherwise without abnormality on                            direct and retroflexion views.                           The remaining stool was normal, brown color. Complications:            No immediate complications. Estimated blood loss:                            None. Estimated Blood Loss:     Estimated blood loss: none. Impression:               - Diverticulosis in the  entire examined colon.                           - The examination was otherwise normal on direct                            and retroflexion views.                           - No specimens collected. Recommendation:           - Patient has a contact number available for                            emergencies. The signs and symptoms of potential                            delayed complications were discussed with the                            patient. Return to normal activities tomorrow.                            Written discharge instructions were provided to the                            patient.                           - Will proceed with EGD now.                           - Resume previous diet.                           - Continue present medications.                           - No repeat colonoscopy due to age. Rachael Fee, MD 04/12/2016 1:45:45  PM This report has been signed electronically.

## 2016-04-12 NOTE — Patient Instructions (Signed)
Discharge instructions given. Handout on diverticulosis and a high fiber diet. Biopsies taken. Resume previous medications. YOU HAD AN ENDOSCOPIC PROCEDURE TODAY AT THE [facility]:   Refer to the procedure report that was given to you for any specific questions about what was found during the examination.  If the procedure report does not answer your questions, please call your gastroenterologist to clarify.  If you requested that your care partner not be given the details of your procedure findings, then the procedure report has been included in a sealed envelope for you to review at your convenience later.  YOU SHOULD EXPECT: Some feelings of bloating in the abdomen. Passage of more gas than usual.  Walking can help get rid of the air that was put into your GI tract during the procedure and reduce the bloating. If you had a lower endoscopy (such as a colonoscopy or flexible sigmoidoscopy) you may notice spotting of blood in your stool or on the toilet paper. If you underwent a bowel prep for your procedure, you may not have a normal bowel movement for a few days.  Please Note:  You might notice some irritation and congestion in your nose or some drainage.  This is from the oxygen used during your procedure.  There is no need for concern and it should clear up in a day or so.  SYMPTOMS TO REPORT IMMEDIATELY:   Following lower endoscopy (colonoscopy or flexible sigmoidoscopy):  Excessive amounts of blood in the stool  Significant tenderness or worsening of abdominal pains  Swelling of the abdomen that is new, acute  Fever of 100F or higher   Following upper endoscopy (EGD)  Vomiting of blood or coffee ground material  New chest pain or pain under the shoulder blades  Painful or persistently difficult swallowing  New shortness of breath  Fever of 100F or higher  Black, tarry-looking stools  For urgent or emergent issues, a gastroenterologist can be reached at any hour by  calling [phone].   DIET: Your first meal following the procedure should be a small meal and then it is ok to progress to your normal diet. Heavy or fried foods are harder to digest and may make you feel nauseous or bloated.  Likewise, meals heavy in dairy and vegetables can increase bloating.  Drink plenty of fluids but you should avoid alcoholic beverages for 24 hours.  ACTIVITY:  You should plan to take it easy for the rest of today and you should NOT DRIVE or use heavy machinery until tomorrow (because of the sedation medicines used during the test).    FOLLOW UP: Our staff will call the number listed on your records the next business day following your procedure to check on you and address any questions or concerns that you may have regarding the information given to you following your procedure. If we do not reach you, we will leave a message.  However, if you are feeling well and you are not experiencing any problems, there is no need to return our call.  We will assume that you have returned to your regular daily activities without incident.  If any biopsies were taken you will be contacted by phone or by letter within the next 1-3 weeks.  Please call us at [phone] if you have not heard about the biopsies in 3 weeks.    SIGNATURES/CONFIDENTIALITY: You and/or your care partner have signed paperwork which will be entered into your electronic medical record.  These signatures attest  to the fact that that the information above on your After Visit Summary has been reviewed and is understood.  Full responsibility of the confidentiality of this discharge information lies with you and/or your care-partner. 

## 2016-04-12 NOTE — Progress Notes (Signed)
Pt states he is still having "black liquid" as a result of drinking his prep.  States he drank entire prep as directed; no solid stool.  Dr. Christella Hartigan aware and ok to proceed with procedure.

## 2016-04-13 ENCOUNTER — Telehealth: Payer: Self-pay

## 2016-04-13 NOTE — Telephone Encounter (Signed)
  Follow up Call-  Call back number 04/12/2016 01/15/2015  Post procedure Call Back phone  # [phone]  Permission to leave phone message Yes Yes     Patient questions:  Do you have a fever, pain , or abdominal swelling? No. Pain Score  0 *  Have you tolerated food without any problems? Yes.    Have you been able to return to your normal activities? Yes.    Do you have any questions about your discharge instructions: Diet   No. Medications  No. Follow up visit  No.  Do you have questions or concerns about your Care? No.  Actions: * If pain score is 4 or above: No action needed, pain <4.

## 2016-04-18 ENCOUNTER — Encounter: Payer: Self-pay | Admitting: Gastroenterology

## 2016-05-16 ENCOUNTER — Other Ambulatory Visit: Payer: Self-pay | Admitting: Internal Medicine

## 2016-05-24 ENCOUNTER — Other Ambulatory Visit: Payer: Self-pay | Admitting: Internal Medicine

## 2016-06-03 ENCOUNTER — Other Ambulatory Visit: Payer: Self-pay

## 2016-06-03 MED ORDER — CELECOXIB 200 MG PO CAPS: ORAL_CAPSULE | ORAL | Status: DC

## 2016-06-21 ENCOUNTER — Other Ambulatory Visit (INDEPENDENT_AMBULATORY_CARE_PROVIDER_SITE_OTHER): Payer: Medicare Other

## 2016-06-21 DIAGNOSIS — Z0000 Encounter for general adult medical examination without abnormal findings: Secondary | ICD-10-CM | POA: Diagnosis not present

## 2016-06-21 LAB — CBC WITH DIFFERENTIAL/PLATELET
BASOS PCT: 0.5 % (ref 0.0–3.0)
Basophils Absolute: 0 10*3/uL (ref 0.0–0.1)
EOS PCT: 8.5 % — AB (ref 0.0–5.0)
Eosinophils Absolute: 0.3 10*3/uL (ref 0.0–0.7)
HCT: 35.9 % — ABNORMAL LOW (ref 39.0–52.0)
Hemoglobin: 11.9 g/dL — ABNORMAL LOW (ref 13.0–17.0)
LYMPHS ABS: 1 10*3/uL (ref 0.7–4.0)
Lymphocytes Relative: 25.6 % (ref 12.0–46.0)
MCHC: 33 g/dL (ref 30.0–36.0)
MCV: 91.4 fl (ref 78.0–100.0)
MONO ABS: 0.4 10*3/uL (ref 0.1–1.0)
Monocytes Relative: 10.5 % (ref 3.0–12.0)
NEUTROS PCT: 54.9 % (ref 43.0–77.0)
Neutro Abs: 2.1 10*3/uL (ref 1.4–7.7)
Platelets: 210 10*3/uL (ref 150.0–400.0)
RBC: 3.93 Mil/uL — ABNORMAL LOW (ref 4.22–5.81)
RDW: 17.1 % — AB (ref 11.5–15.5)
WBC: 3.9 10*3/uL — ABNORMAL LOW (ref 4.0–10.5)

## 2016-06-21 LAB — POC URINALSYSI DIPSTICK (AUTOMATED)
Bilirubin, UA: NEGATIVE
Glucose, UA: NEGATIVE
KETONES UA: NEGATIVE
Leukocytes, UA: NEGATIVE
NITRITE UA: NEGATIVE
PH UA: 7
PROTEIN UA: NEGATIVE
RBC UA: NEGATIVE
Spec Grav, UA: 1.015
Urobilinogen, UA: 1

## 2016-06-21 LAB — LIPID PANEL
CHOLESTEROL: 184 mg/dL (ref 0–200)
HDL: 73.9 mg/dL (ref >39.00)
LDL CALC: 100 mg/dL — AB (ref 0–99)
NONHDL: 110.48
TRIGLYCERIDES: 53 mg/dL (ref 0.0–149.0)
Total CHOL/HDL Ratio: 2
VLDL: 10.6 mg/dL (ref 0.0–40.0)

## 2016-06-21 LAB — BASIC METABOLIC PANEL
BUN: 13 mg/dL (ref 6–23)
CHLORIDE: 102 meq/L (ref 96–112)
CO2: 32 mEq/L (ref 19–32)
Calcium: 9.5 mg/dL (ref 8.4–10.5)
Creatinine, Ser: 0.74 mg/dL (ref 0.40–1.50)
GFR: 107.75 mL/min (ref >60.00)
GLUCOSE: 90 mg/dL (ref 70–99)
POTASSIUM: 4 meq/L (ref 3.5–5.1)
Sodium: 141 mEq/L (ref 135–145)

## 2016-06-21 LAB — HEPATIC FUNCTION PANEL
ALT: 9 U/L (ref 0–53)
AST: 17 U/L (ref 0–37)
Albumin: 3.9 g/dL (ref 3.5–5.2)
Alkaline Phosphatase: 41 U/L (ref 39–117)
BILIRUBIN TOTAL: 0.7 mg/dL (ref 0.2–1.2)
Bilirubin, Direct: 0.1 mg/dL (ref 0.0–0.3)
Total Protein: 6.6 g/dL (ref 6.0–8.3)

## 2016-06-21 LAB — TSH: TSH: 1.39 u[IU]/mL (ref 0.35–4.50)

## 2016-06-21 LAB — PSA: PSA: 1.08 ng/mL (ref 0.10–4.00)

## 2016-06-24 ENCOUNTER — Other Ambulatory Visit: Payer: Self-pay | Admitting: Internal Medicine

## 2016-06-25 ENCOUNTER — Telehealth: Payer: Self-pay | Admitting: Internal Medicine

## 2016-06-25 ENCOUNTER — Encounter: Payer: Self-pay | Admitting: Internal Medicine

## 2016-06-25 ENCOUNTER — Ambulatory Visit (INDEPENDENT_AMBULATORY_CARE_PROVIDER_SITE_OTHER): Payer: Medicare Other | Admitting: Internal Medicine

## 2016-06-25 VITALS — BP 110/70 | HR 74 | Temp 98.2°F | Ht 67.25 in | Wt 144.0 lb

## 2016-06-25 DIAGNOSIS — E785 Hyperlipidemia, unspecified: Secondary | ICD-10-CM

## 2016-06-25 DIAGNOSIS — I10 Essential (primary) hypertension: Secondary | ICD-10-CM | POA: Diagnosis not present

## 2016-06-25 DIAGNOSIS — I2510 Atherosclerotic heart disease of native coronary artery without angina pectoris: Secondary | ICD-10-CM | POA: Diagnosis not present

## 2016-06-25 DIAGNOSIS — K222 Esophageal obstruction: Secondary | ICD-10-CM

## 2016-06-25 DIAGNOSIS — Z0000 Encounter for general adult medical examination without abnormal findings: Secondary | ICD-10-CM | POA: Diagnosis not present

## 2016-06-25 NOTE — Progress Notes (Signed)
Pre visit review using our clinic review tool, if applicable. No additional management support is needed unless otherwise documented below in the visit note. 

## 2016-06-25 NOTE — Telephone Encounter (Signed)
Pt did not have a return date on his AVS, and we made decision to return I 6 months if that is ok with you. Advised pt I would call him if there needed to be changed

## 2016-06-25 NOTE — Progress Notes (Signed)
Subjective:    Patient ID: Dakota Green, male    DOB: [birth year], 82 y.o.   MRN: [id number]  HPI   Subjective:    Patient ID: Dakota Green, male    DOB: 1933-11-01, 82 y.o.   MRN: [id number]  HPI  42 -year-old patient who is seen today for followup and any preventive health examination.  He Is followed by cardiology for coronary artery disease.  He continues to do quite well.  He denies any cardiopulmonary complaints.  He has dyslipidemia, but has been statin intolerant. He has a history of inflammatory osteoarthritis and has done quite well on prednisone 5 mg daily. Attempts at discontinuation have resulted in significant arthritic pain. He also remains on Celebrex. Denies any cardiopulmonary complaints  laboratory studies reviewed and revealed some moderate anemia.   He is status post surgery for spinal stenosis, lumbar region 05/05/2015 and repeat surgery November 2016 He is status post EDG February 2016 for esophageal stricture   Here for Medicare AWV:   1. Risk factors based on Past M, S, F history: patient has known coronary artery disease, history of dyslipidemia, hypertension.  2. Physical Activities: goes to the gym 4 times weekly  3. Depression/mood: no history of depression, or mood disorder  4. Hearing: no deficits  5. ADL's: independent in all aspects of daily living  6. Fall Risk: low  7. Home Safety: no problems identified  8. Height, weight, &visual acuity:height and weight stable.  He is now followed closely by ophthalmology due to macular degeneration 9. Counseling: heart healthy diet, continued regular exercise, all encouraged  10. Labs ordered based on risk factors: laboratory profile, including lipid profile, and sedimentation rate will be reviewed  11. Referral Coordination- rheumatology referral as scheduled  12. Care Plan- will follow up with rheumatology. Will review a sedimentation rate will also discontinue at least temporarily. The Crestor  13. Cognitive  Assessment- alert and oriented, with normal affect. No history of memory concerns handles all executive functioning without difficulty  14.   Preventive services will include annual clinical exams with screening lab. Annual eye examinations.  Encouraged  15.   Provider list includes primary care orthopedics ophthalmology and GI  Allergies (verified):  No Known Drug Allergies  Past History:  Past Medical History:   Coronary artery disease  GERD stricture formation  Hyperlipidemia  Hypertension  Peptic ulcer disease  Allergic rhinitis  DJD  Osteoarthritis   Past Surgical History:   Coronary artery bypass graft 1999  Cardiac Stent placement-1987, 1998  Cataract extraction-bilaterally  cardiac catheterization February 2009  EGD 2007  2016 colonoscopy 2004  2010  Rotator cuff repair  Family History:   father died age 73 mother died age 28, MI, hypertension  6 brothers 3 sisters, positive for lung and prostate cancer, coronary artery disease  Family History of Prostate Cancer: Brother  No FH of Colon Cancer:  Family History of Heart Disease: Mother   Social History:   Retired  Married  Patient is a former smoker. -stopped 88  Alcohol Use - yes-4 drinks daily  Daily Caffeine Use-1 cup daily  Illicit Drug Use - no   Review of Systems  Constitutional: Negative for fever, chills, activity change, appetite change and fatigue.  HENT: Negative for hearing loss, ear pain, congestion, rhinorrhea, sneezing, mouth sores, trouble swallowing, neck pain, neck stiffness, dental problem, voice change, sinus pressure and tinnitus.   Eyes: Negative for photophobia, pain, redness and visual disturbance.  Respiratory: Negative for apnea,  cough, choking, chest tightness, shortness of breath and wheezing.   Cardiovascular: Negative for chest pain, palpitations and leg swelling.  Gastrointestinal: Negative for nausea, vomiting, abdominal pain, diarrhea, constipation, blood in stool,  abdominal distention, anal bleeding and rectal pain.  Genitourinary: Negative for dysuria, urgency, frequency, hematuria, flank pain, decreased urine volume, discharge, penile swelling, scrotal swelling, difficulty urinating, genital sores and testicular pain.  Musculoskeletal: Positive for back pain, joint swelling and arthralgias. Negative for myalgias and gait problem.  Skin: Negative for color change, rash and wound.  Neurological: Negative for dizziness, tremors, seizures, syncope, facial asymmetry, speech difficulty, weakness, light-headedness, numbness and headaches.  Hematological: Negative for adenopathy. Does not bruise/bleed easily.  Psychiatric/Behavioral: Negative for suicidal ideas, hallucinations, behavioral problems, confusion, sleep disturbance, self-injury, dysphoric mood, decreased concentration and agitation. The patient is not nervous/anxious.        Objective:   Physical Exam  Constitutional: He appears well-developed and well-nourished.  HENT:  Head: Normocephalic and atraumatic.  Right Ear: External ear normal.  Left Ear: External ear normal.  Nose: Nose normal.  Mouth/Throat: Oropharynx is clear and moist.  Eyes: Conjunctivae and EOM are normal. Pupils are equal, round, and reactive to light. No scleral icterus.  Neck: Normal range of motion. Neck supple. No JVD present. No thyromegaly present.  Cardiovascular: Regular rhythm, normal heart sounds and intact distal pulses.  Exam reveals no gallop and no friction rub.   No murmur heard.      Posterior tibial pulses not easily palpable  Pulmonary/Chest: Effort normal and breath sounds normal. He exhibits no tenderness.  Abdominal: Soft. Bowel sounds are normal. He exhibits no distension and no mass. There is no tenderness.  Genitourinary: Penis normal. Guaiac negative stool.       Prostate +2 enlarged  Musculoskeletal: Normal range of motion. He exhibits no edema and no tenderness.  Lymphadenopathy:    He has no  cervical adenopathy.  Neurological: He is alert. He has normal reflexes. No cranial nerve deficit. Coordination normal.  Skin: Skin is warm and dry. No rash noted.  Psychiatric: He has a normal mood and affect. His behavior is normal.          Assessment & Plan:   Preventive health examination Coronary artery disease stable Hypertension well controlled Dyslipidemia Inflammatory osteoarthritis. We'll continue present regimen. Recheck in 4 months Impaired glucose tolerance. Stable   Review of Systems      Objective:   Physical Exam  Neck:  Faint right supraclavicular bruit  Genitourinary: Rectal exam shows guaiac positive stool.          Assessment & Plan:   As above Laboratory studies reviewed.  Fasting blood sugar today.  Normal Macular degeneration.  Follow-up ophthalmology  Follow-up here 6 months or as needed Medications updated Laboratory studies reviewed and discussed and copy presented to patient  Rogelia Boga, MD

## 2016-07-01 IMAGING — CR DG CHEST 2V
2 series · 2 of 2 positions shown · non-contrast
Comparison: 04/30/2015

CLINICAL DATA: Hemoptysis.

EXAM:
CHEST  2 VIEW

[chest pa]
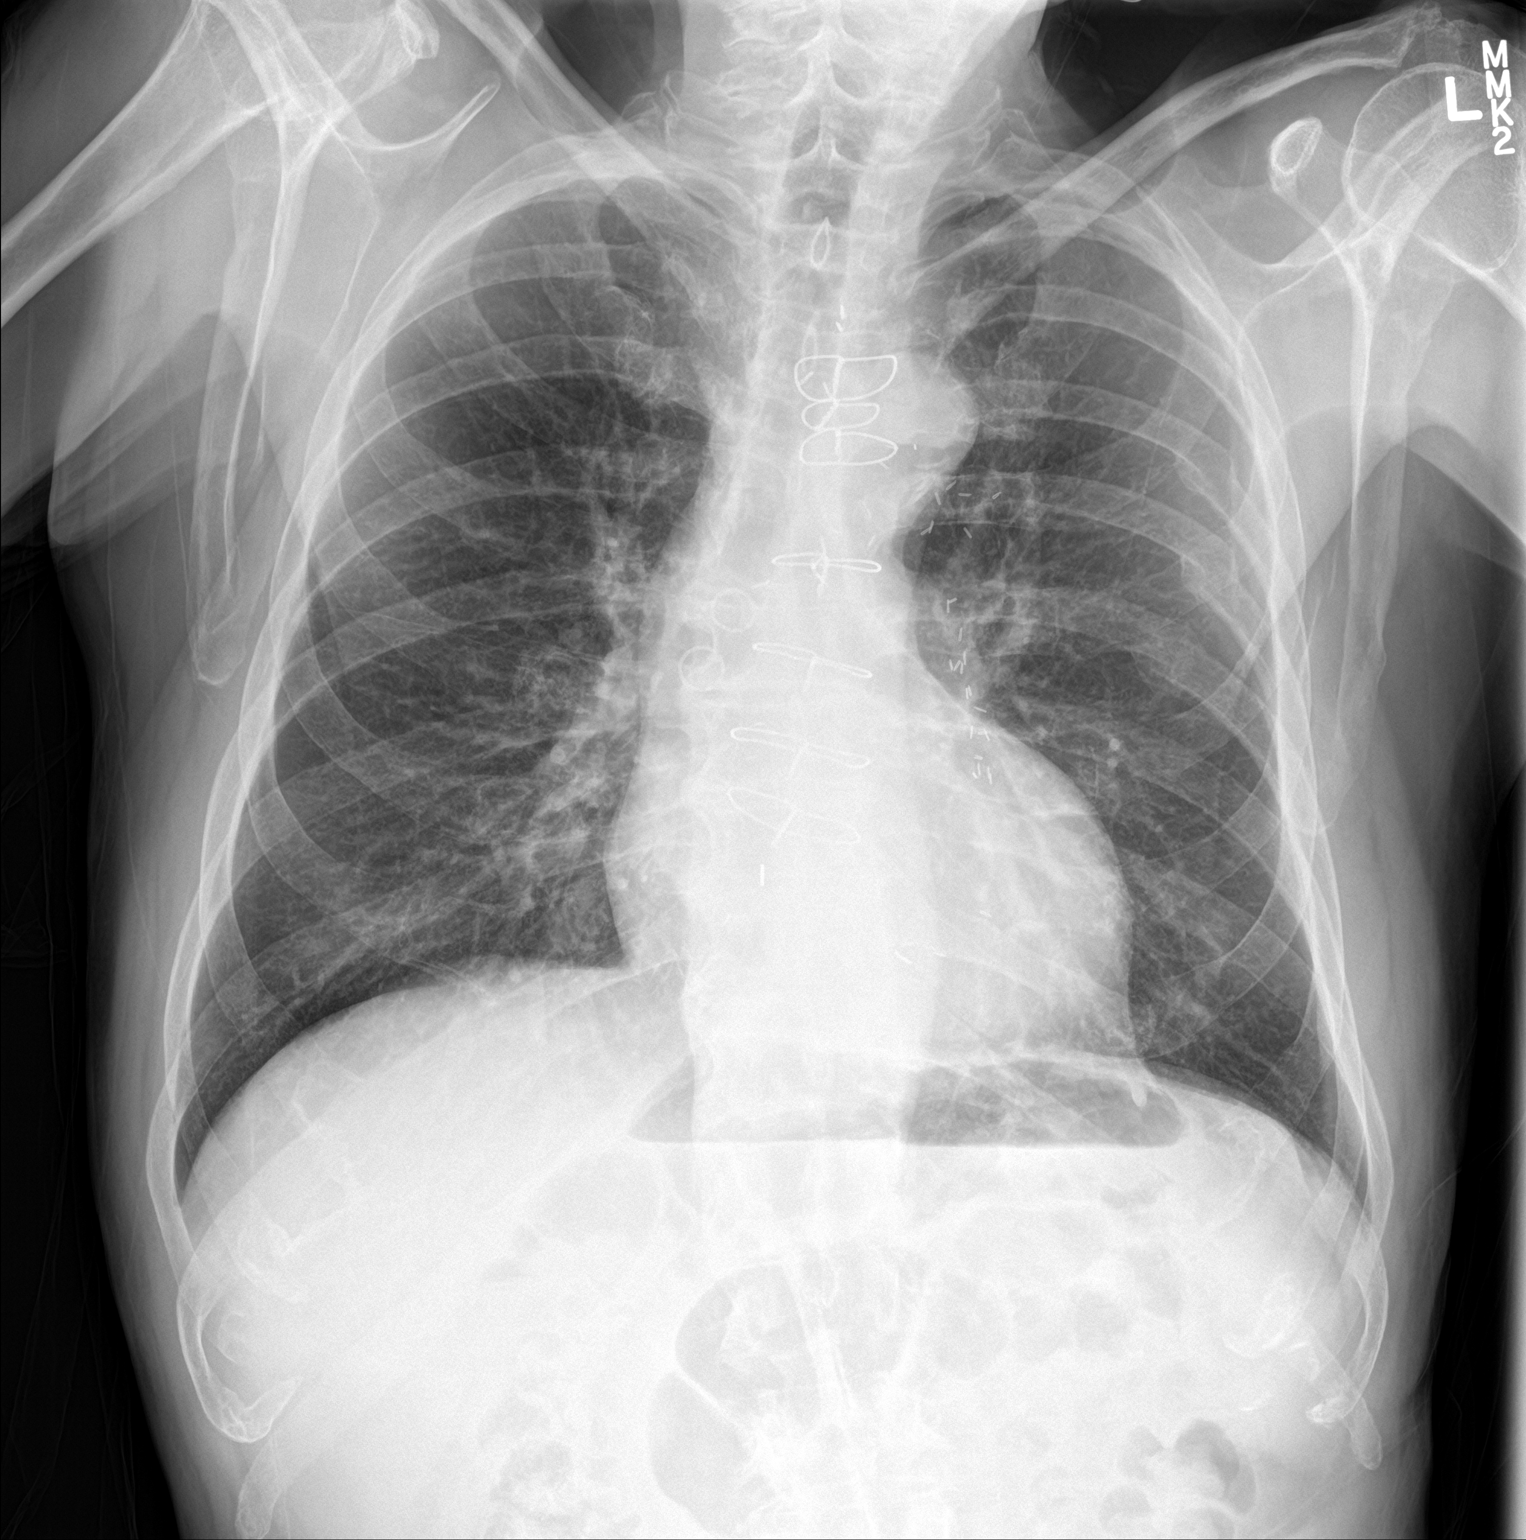

[chest lat]
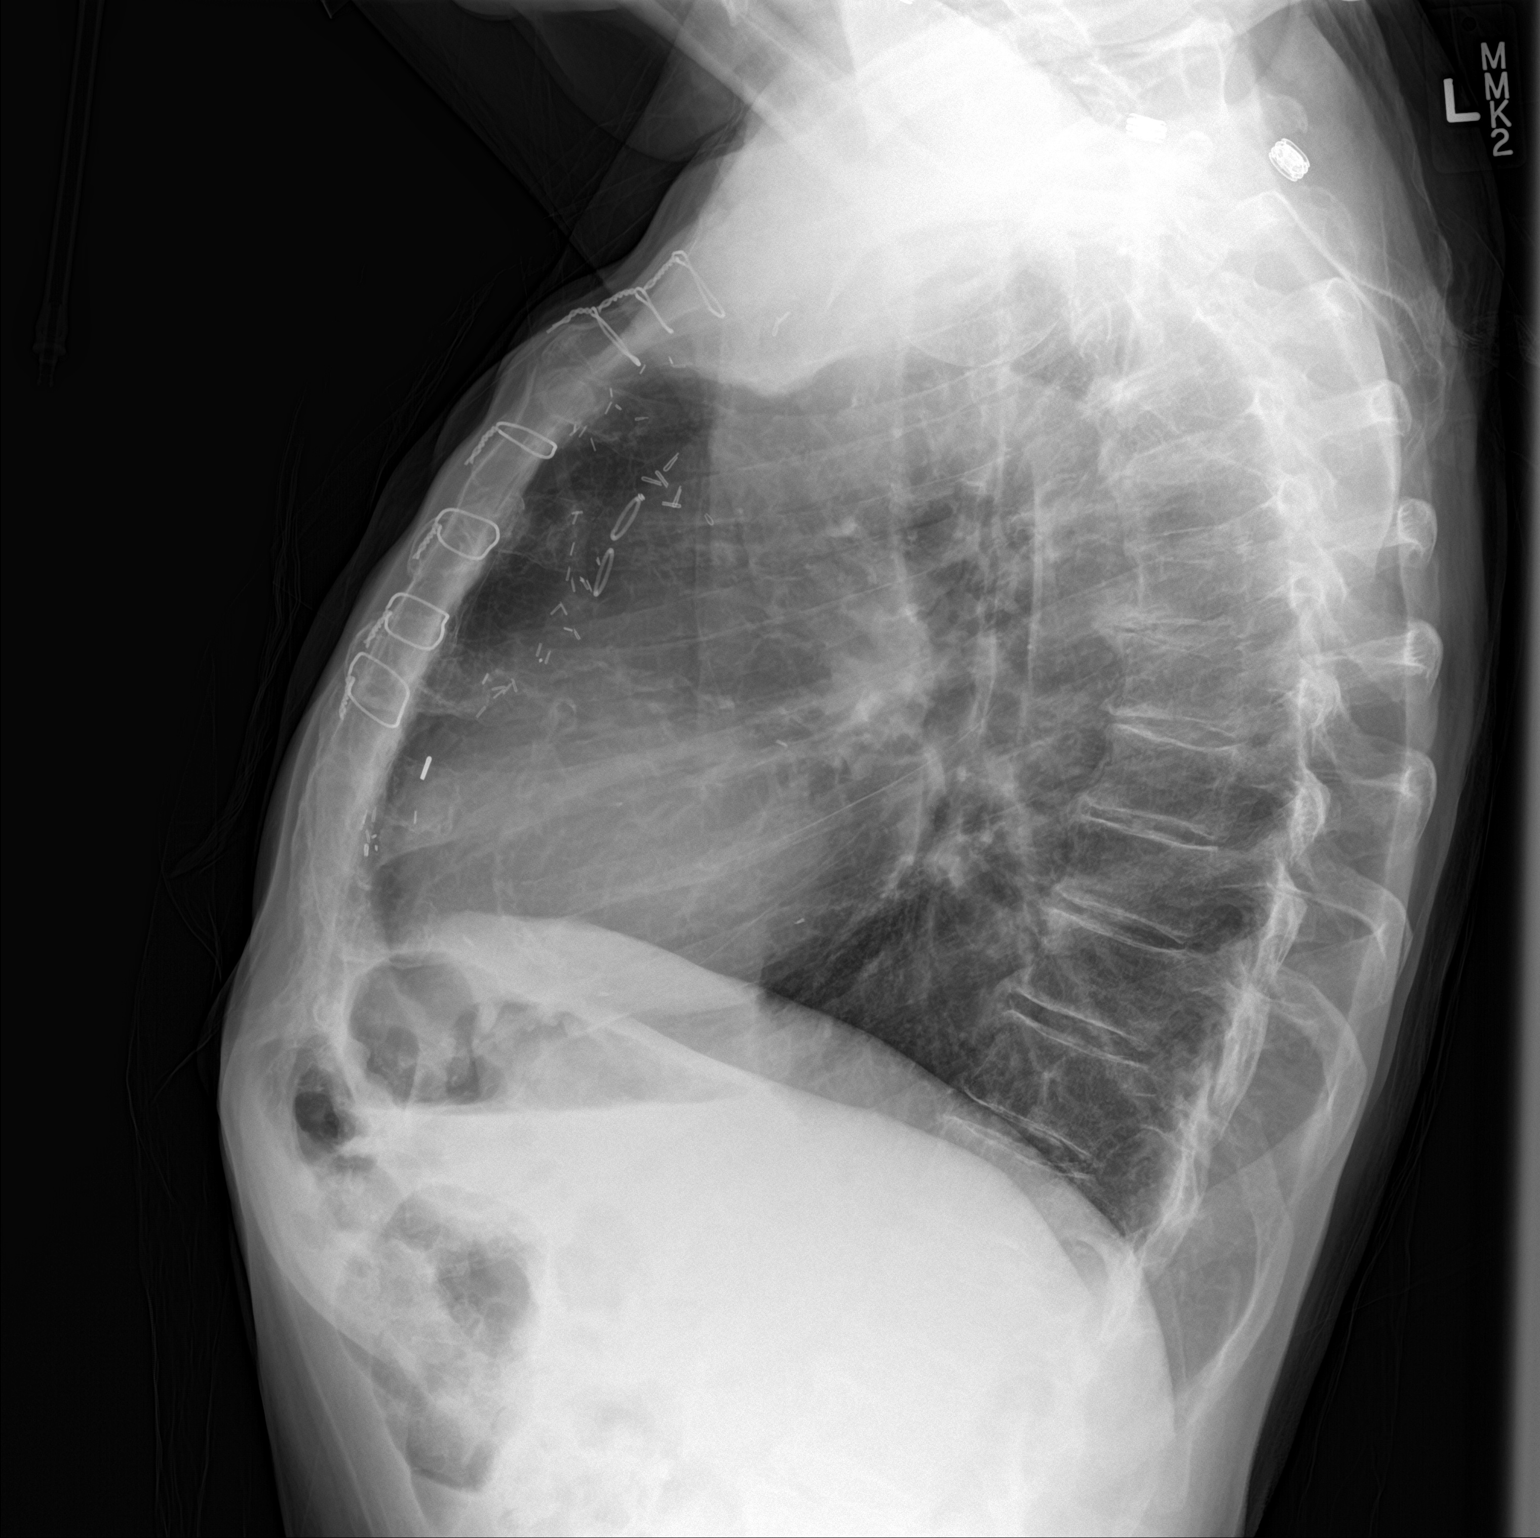

[2 of 2 positions shown; findings below may reference images not displayed]

FINDINGS: Previous median sternotomy and CABG procedure. Heart size is normal.
No pleural effusion or edema. Chronic left posterior rib fracture
deformities are noted involving the left fifth, sixth and seventh
ribs.
IMPRESSION: 1. No acute cardiopulmonary abnormalities.

## 2016-08-17 ENCOUNTER — Other Ambulatory Visit: Payer: Self-pay | Admitting: Internal Medicine

## 2016-08-27 ENCOUNTER — Other Ambulatory Visit: Payer: Self-pay | Admitting: *Deleted

## 2016-08-27 MED ORDER — ESOMEPRAZOLE MAGNESIUM 40 MG PO CPDR: DELAYED_RELEASE_CAPSULE | ORAL | 3 refills | Status: DC

## 2016-08-29 ENCOUNTER — Other Ambulatory Visit: Payer: Self-pay | Admitting: Internal Medicine

## 2016-10-31 ENCOUNTER — Other Ambulatory Visit: Payer: Self-pay | Admitting: Cardiovascular Disease

## 2016-11-01 ENCOUNTER — Telehealth: Payer: Self-pay | Admitting: Cardiovascular Disease

## 2016-11-01 ENCOUNTER — Other Ambulatory Visit: Payer: Self-pay | Admitting: Cardiovascular Disease

## 2016-11-01 MED ORDER — EZETIMIBE 10 MG PO TABS: 10.0000 mg | ORAL_TABLET | Freq: Every day | ORAL | 0 refills | Status: DC

## 2016-11-01 NOTE — Telephone Encounter (Signed)
Spoke with Dr. Harvie Bridge Nurse, Nadeen Landau, concerning pt's medication not being on pt's med list. Pt's zetia 10 mg tablet was D/C by another provider and taken off pt's med list. Marcelino Duster, RN, stated that Dr. Amador Cunas had been following pt's lipid panel and stated if pt would like to continue following with Dr. Amador Cunas, they could or make an appt with Dr. Elease Hashimoto and let him follow the pt. Wife made an appt with Dr. Elease Hashimoto for 11/12/16 at 2:15 pm and michelle, Rn okay for medication to be added back to pt's list and refill up until the appt. I explained to the wife that if the pt has any other problems, questions or concerns to call the office.  Wife verbalized understanding. Medication was sent to pt's pharmacy, confirmation received.

## 2016-11-03 ENCOUNTER — Other Ambulatory Visit: Payer: Self-pay | Admitting: Internal Medicine

## 2016-11-12 ENCOUNTER — Encounter: Payer: Self-pay | Admitting: Cardiovascular Disease

## 2016-11-12 ENCOUNTER — Ambulatory Visit (INDEPENDENT_AMBULATORY_CARE_PROVIDER_SITE_OTHER): Payer: Medicare Other | Admitting: Cardiovascular Disease

## 2016-11-12 VITALS — BP 146/84 | HR 70 | Ht 67.25 in | Wt 146.8 lb

## 2016-11-12 DIAGNOSIS — I2510 Atherosclerotic heart disease of native coronary artery without angina pectoris: Secondary | ICD-10-CM | POA: Diagnosis not present

## 2016-11-12 DIAGNOSIS — I340 Nonrheumatic mitral (valve) insufficiency: Secondary | ICD-10-CM | POA: Diagnosis not present

## 2016-11-12 DIAGNOSIS — E782 Mixed hyperlipidemia: Secondary | ICD-10-CM | POA: Diagnosis not present

## 2016-11-12 NOTE — Progress Notes (Signed)
Dakota Green Date of Birth  11/29/1933  [facility] [address]    [address] [place], [place]  [id number] [phone]  Fax  [phone]   Problem List: 1. CAD, status post coronary artery bypass grafting in 1999,  status post PCI in 2008 2. Right shoulder surgery 3  Hyperlipidemia 4. Arthritis 5. hypertension  History of Present Illness:  Dakota Green is a 82 year old gentleman with a history of coronary artery disease. He has a history of hyperlipidemia. He is intolerant to all statin medications.  He is having lots of problems with arthritis.     Roniel fell and injured his right arm about 4 months ago. He's recently had surgery to repair torn tendons in the shoulder. He's doing very well from a cardiac standpoint. He's not had any episodes of chest pain or shortness of breath.   He has not had any cardiac complications since that time.  Nov. 12, 2014:  Dakota Green is doing well.  Staying busy .  Exercising regularly.  [place]   [place]    He has been having lots of problems with osteoarthritis.  He is on prednisone and celebrex and is doing well.   He has tired multiple statins and has not found one that he could take.  His last cholesterol levels are elevated.   Nov. 13, 2015:  Dakota Green is doing well.  He is going for an epidural injection next week. No CP or dyspnea.  Has some bruising on his arms - tried holding his ASA and plavix for several days - is back on them now.   He has lost some weight and his BP has been low. He has been off his Benazapril for several months and his blood pressure remains normal.    102/66 today.  Nov. 14, 2016:  Doing well.  Having more back surgery this Thursday  . Is holding the plavix for now.   No CP or dyspnea.   Dec. 15, 2017:  BP has been ok at home Drank coffee for lunch and tea today  No CP or dyspnea  Getting some exercise Walks 10,000 steps almost every day   Current  Outpatient Prescriptions on File Prior to Visit  Medication Sig Dispense Refill  . ALPHAGAN P 0.1 % SOLN Place 1 drop into the right eye 2 (two) times daily.     Marland Kitchen. aspirin 81 MG tablet Take 81 mg by mouth daily.      . calcium-vitamin D (OSCAL WITH D) 500-200 MG-UNIT per tablet Take 2 tablets by mouth daily with breakfast.    . celecoxib (CELEBREX) 200 MG capsule TAKE 1 CAPSULE BY MOUTH 2 TIMES A DAY 180 capsule 1  . clopidogrel (PLAVIX) 75 MG tablet TAKE 1 TABLET BY MOUTH DAILY 90 tablet 1  . docusate sodium (COLACE) 100 MG capsule Take 100 mg by mouth 2 (two) times daily.    Marland Kitchen. esomeprazole (NEXIUM) 40 MG capsule Take 1 tab twice daily. 90 capsule 3  . ezetimibe (ZETIA) 10 MG tablet Take 1 tablet (10 mg total) by mouth daily. 30 tablet 0  . gabapentin (NEURONTIN) 100 MG capsule Take 100 mg by mouth at bedtime.    Marland Kitchen. loratadine (CLARITIN) 10 MG tablet Take 10 mg by mouth daily.      . Multiple Vitamins-Minerals (PRESERVISION/LUTEIN PO) Take 1 tablet by mouth 2 (two) times daily.     . predniSONE (DELTASONE) 5 MG tablet TAKE  1 TABLET BY MOUTH DAILY 90 tablet 1  . tamsulosin (FLOMAX) 0.4 MG CAPS capsule TAKE 2 CAPSULES (0.8 MG TOTAL) BY MOUTH DAILY. 180 capsule 3  . temazepam (RESTORIL) 30 MG capsule TAKE 1 CAPSULE AT BEDTIME 30 capsule 2   No current facility-administered medications on file prior to visit.     Allergies  Allergen Reactions  . Statins Other (See Comments)    Muscle aches  . Crestor [Rosuvastatin Calcium]     Aches   . Latex Other (See Comments)    Makes skin red & causes irritation    Past Medical History:  Diagnosis Date  . ALLERGIC RHINITIS 05/04/2007  . Allergy   . Bleeding ulcer 1985  . Blood transfusion without reported diagnosis   . BPH (benign prostatic hyperplasia)   . Bruises easily   . CORONARY ARTERY DISEASE 05/04/2007  . DIVERTICULOSIS, COLON 08/14/2009  . DJD (degenerative joint disease)   . ESOPHAGEAL STRICTURE 08/14/2009  . GERD 05/04/2007  . Glaucoma     right eye  . HIATAL HERNIA 08/14/2009  . HYPERLIPIDEMIA 05/04/2007  . HYPERTENSION 05/04/2007  . Internal hemorrhoids   . Myocardial infarction 1987  . Osteoarth NOS-Unspec 05/04/2007  . PEPTIC ULCER DISEASE 05/04/2007   bleeding ulcer with hospitalization  . Torn rotator cuff    right    Past Surgical History:  Procedure Laterality Date  . BACK SURGERY  05/05/2015   Spinal Stenosis, Rod and 2 screws placed in spine  . CARDIAC CATHETERIZATION  06/12/2007   EF 45%  . CARDIAC CATHETERIZATION  01/18/2005   EF 50-55%  . CARDIAC CATHETERIZATION  01/14/2003   EF 55%  . CARDIOVASCULAR STRESS TEST  02/07/2001  . CATARACT EXTRACTION Bilateral   . COLONOSCOPY    . CORONARY ANGIOPLASTY     1987  . CORONARY ANGIOPLASTY WITH STENT PLACEMENT     1998  . CORONARY ARTERY BYPASS GRAFT     X 4  . EYE SURGERY    . LUMBAR LAMINECTOMY/DECOMPRESSION MICRODISCECTOMY Right 10/15/2015   Procedure: LUMBAR THREE-FOUR LUMBAR LAMINECTOMY/DECOMPRESSION MICRODISCECTOMY ;  Surgeon: Tressie Stalker, MD;  Location: [facility];  Service: Neurosurgery;  Laterality: Right;  Right L34 microdiskectomy  . SHOULDER ARTHROSCOPY  2013   right  . SHOULDER ARTHROSCOPY WITH ROTATOR CUFF REPAIR AND SUBACROMIAL DECOMPRESSION Left 05/14/2014   Procedure: LEFT SHOULDER ARTHROSCOPY WITH DEBRIDEMENT EXTENSIVE, DISAL CLAVICULECTOMY, SUBACROMIAL DECOMPRESSION PARTIAL ACROMIOPLASTY WITH CORACOACROMIAL RELEASE AND ROTATOR CUFF REPAIR;  Surgeon: Nilda Simmer, MD;  Location: [facility];  Service: Orthopedics;  Laterality: Left;  . TONSILLECTOMY    . UPPER GASTROINTESTINAL ENDOSCOPY    . UPPER GI ENDOSCOPY     X 3  . US ECHOCARDIOGRAPHY  08/23/2005   EF 50-55%    History  Smoking Status  . Former Smoker  . Packs/day: 4.00  . Years: 35.00  . Quit date: 11/29/1985  Smokeless Tobacco  . Never Used    History  Alcohol Use  . 4.2 oz/week  . 7 Shots of liquor per week    Comment: occasional    Family History   Problem Relation Age of Onset  . Heart attack Mother   . Heart attack Father   . Prostate cancer Brother   . Colon cancer Neg Hx   . Esophageal cancer Neg Hx   . Rectal cancer Neg Hx   . Stomach cancer Neg Hx     Reviw of Systems:  Reviewed in the HPI.  All other systems  are negative.  Physical Exam: BP (!) 146/84 (BP Location: Left Arm, Patient Position: Sitting, Cuff Size: Normal)   Pulse 70   Ht 5' 7.25" (1.708 m)   Wt 146 lb 12.8 oz (66.6 kg)   BMI 22.82 kg/m   The patient is alert and oriented x 3.  The mood and affect are normal.   Skin: warm and dry.  Color is normal.    HEENT:   the sclera are nonicteric.  The mucous membranes are moist.  The carotids are 2+ without bruits.  There is no thyromegaly.  There is no JVD.    Lungs: clear.  The chest wall is non tender.    Heart: regular rate with a normal S1 and S2. 2/6 systolic murmur radiating to the axilla.    The PMI is not displaced.     Abdomen: good bowel sounds.  There is no guarding or rebound.  There is no hepatosplenomegaly or tenderness.  There are no masses.   Extremities:  no clubbing, cyanosis, or edema.  The legs are without rashes.  The distal pulses are intact.   Neuro:  Cranial nerves II - XII are intact.  Motor and sensory functions are intact.    The gait is normal.  ECG: 11/12/2016: Normal sinus rhythm at 70. Has nonspecific ST and T wave changes. Artifact is present. Assessment / Plan:   1. CAD, status post coronary artery bypass grafting in 1999,  status post PCI in 2008. Doing well. Continue current medications. His Plavix is currently on hold because of back surgery this week.  2. Right shoulder surgery 3  Hyperlipidemia - has improved with diet .  4. Arthritis 5. Hypertension - BP is well controlled.   Only a bit higher here.      Kristeen Miss, MD  11/12/2016 2:47 PM    [facility] [address] [place],  [address] [place], [place]  [address] Pager [phone] Phone: [phone]; Fax: [phone]   [facility]  [address] [place], [place]  [address] [phone]   Fax [phone]

## 2016-11-12 NOTE — Patient Instructions (Addendum)
Medication Instructions:  Your physician recommends that you continue on your current medications as directed. Please refer to the Current Medication list given to you today.   Labwork: TODAY - cholesterol, complete metabolic panel  Your physician recommends that you return for lab work in: 6 months on the day of or a few days before your office visit with Dr. Elease Hashimoto.  You will need to FAST for this appointment - nothing to eat or drink after midnight the night before except water.   Testing/Procedures: Your physician has requested that you have an echocardiogram. Echocardiography is a painless test that uses sound waves to create images of your heart. It provides your doctor with information about the size and shape of your heart and how well your heart's chambers and valves are working. This procedure takes approximately one hour. There are no restrictions for this procedure.   Follow-Up: Your physician wants you to follow-up in: 6 months with Dr. Elease Hashimoto.  You will receive a reminder letter in the mail two months in advance. If you don't receive a letter, please call our office to schedule the follow-up appointment.   If you need a refill on your cardiac medications before your next appointment, please call your pharmacy.   Thank you for choosing [facility]! Eligha Bridegroom, RN [phone]

## 2016-11-13 LAB — LIPID PANEL
CHOLESTEROL: 170 mg/dL (ref <200)
HDL: 82 mg/dL (ref >40)
LDL CALC: 64 mg/dL (ref <100)
TRIGLYCERIDES: 119 mg/dL (ref <150)
Total CHOL/HDL Ratio: 2.1 Ratio (ref <5.0)
VLDL: 24 mg/dL (ref <30)

## 2016-11-13 LAB — COMPREHENSIVE METABOLIC PANEL
ALBUMIN: 4 g/dL (ref 3.6–5.1)
ALK PHOS: 45 U/L (ref 40–115)
ALT: 7 U/L — AB (ref 9–46)
AST: 16 U/L (ref 10–35)
BUN: 13 mg/dL (ref 7–25)
CALCIUM: 9.5 mg/dL (ref 8.6–10.3)
CHLORIDE: 102 mmol/L (ref 98–110)
CO2: 33 mmol/L — ABNORMAL HIGH (ref 20–31)
Creat: 0.7 mg/dL (ref 0.70–1.11)
Glucose, Bld: 110 mg/dL — ABNORMAL HIGH (ref 65–99)
POTASSIUM: 4.2 mmol/L (ref 3.5–5.3)
Sodium: 141 mmol/L (ref 135–146)
TOTAL PROTEIN: 6.6 g/dL (ref 6.1–8.1)
Total Bilirubin: 0.4 mg/dL (ref 0.2–1.2)

## 2016-11-16 ENCOUNTER — Other Ambulatory Visit: Payer: Self-pay | Admitting: Internal Medicine

## 2016-11-19 NOTE — Telephone Encounter (Signed)
error 

## 2016-11-29 ENCOUNTER — Other Ambulatory Visit: Payer: Self-pay | Admitting: Cardiovascular Disease

## 2016-12-13 ENCOUNTER — Ambulatory Visit (HOSPITAL_COMMUNITY): Payer: Medicare Other | Attending: Cardiovascular Disease

## 2016-12-13 ENCOUNTER — Other Ambulatory Visit: Payer: Self-pay

## 2016-12-13 DIAGNOSIS — I252 Old myocardial infarction: Secondary | ICD-10-CM | POA: Diagnosis not present

## 2016-12-13 DIAGNOSIS — I2510 Atherosclerotic heart disease of native coronary artery without angina pectoris: Secondary | ICD-10-CM | POA: Diagnosis not present

## 2016-12-13 DIAGNOSIS — E785 Hyperlipidemia, unspecified: Secondary | ICD-10-CM | POA: Insufficient documentation

## 2016-12-13 DIAGNOSIS — I371 Nonrheumatic pulmonary valve insufficiency: Secondary | ICD-10-CM | POA: Diagnosis not present

## 2016-12-13 DIAGNOSIS — I071 Rheumatic tricuspid insufficiency: Secondary | ICD-10-CM | POA: Diagnosis not present

## 2016-12-13 DIAGNOSIS — I340 Nonrheumatic mitral (valve) insufficiency: Secondary | ICD-10-CM | POA: Diagnosis not present

## 2016-12-13 DIAGNOSIS — I10 Essential (primary) hypertension: Secondary | ICD-10-CM | POA: Insufficient documentation

## 2016-12-16 ENCOUNTER — Other Ambulatory Visit: Payer: Self-pay | Admitting: Internal Medicine

## 2016-12-31 ENCOUNTER — Encounter: Payer: Self-pay | Admitting: Internal Medicine

## 2016-12-31 ENCOUNTER — Ambulatory Visit (INDEPENDENT_AMBULATORY_CARE_PROVIDER_SITE_OTHER): Payer: Medicare Other | Admitting: Internal Medicine

## 2016-12-31 VITALS — BP 130/80 | HR 77 | Temp 97.9°F | Ht 67.0 in | Wt 150.6 lb

## 2016-12-31 DIAGNOSIS — I10 Essential (primary) hypertension: Secondary | ICD-10-CM

## 2016-12-31 DIAGNOSIS — M150 Primary generalized (osteo)arthritis: Secondary | ICD-10-CM | POA: Diagnosis not present

## 2016-12-31 DIAGNOSIS — M48061 Spinal stenosis, lumbar region without neurogenic claudication: Secondary | ICD-10-CM

## 2016-12-31 DIAGNOSIS — M159 Polyosteoarthritis, unspecified: Secondary | ICD-10-CM

## 2016-12-31 NOTE — Patient Instructions (Signed)
Limit your sodium (Salt) intake    It is important that you exercise regularly, at least 20 minutes 3 to 4 times per week.  If you develop chest pain or shortness of breath seek  medical attention.  Return in 6 months for follow-up  

## 2016-12-31 NOTE — Progress Notes (Signed)
Subjective:    Patient ID: Dakota Green, male    DOB: 1933/08/06, 83 y.o.   MRN: [id number]  HPI  83 year old patient who is seen today for follow-up.  He has hypertension, coronary artery disease. He is statin intolerant, but the lipid profile as well controlled on diet and Zetia.  No cardiopulmonary complaints.  He has recently been seen by cardiology Main complaints  Continue to be musculoskeletal and low back pain.  Otherwise, doing well  Past Medical History:  Diagnosis Date  . ALLERGIC RHINITIS 05/04/2007  . Allergy   . Bleeding ulcer 1985  . Blood transfusion without reported diagnosis   . BPH (benign prostatic hyperplasia)   . Bruises easily   . CORONARY ARTERY DISEASE 05/04/2007  . DIVERTICULOSIS, COLON 08/14/2009  . DJD (degenerative joint disease)   . ESOPHAGEAL STRICTURE 08/14/2009  . GERD 05/04/2007  . Glaucoma    right eye  . HIATAL HERNIA 08/14/2009  . HYPERLIPIDEMIA 05/04/2007  . HYPERTENSION 05/04/2007  . Internal hemorrhoids   . Myocardial infarction 1987  . Osteoarth NOS-Unspec 05/04/2007  . PEPTIC ULCER DISEASE 05/04/2007   bleeding ulcer with hospitalization  . Torn rotator cuff    right     Social History   Social History  . Marital status: Married    Spouse name: N/A  . Number of children: N/A  . Years of education: N/A   Occupational History  . Not on file.   Social History Main Topics  . Smoking status: Former Smoker    Packs/day: 4.00    Years: 35.00    Quit date: 11/29/1985  . Smokeless tobacco: Never Used  . Alcohol use 4.2 oz/week    7 Shots of liquor per week     Comment: occasional  . Drug use: No  . Sexual activity: Not on file   Other Topics Concern  . Not on file   Social History Narrative  . No narrative on file    Past Surgical History:  Procedure Laterality Date  . BACK SURGERY  05/05/2015   Spinal Stenosis, Rod and 2 screws placed in spine  . CARDIAC CATHETERIZATION  06/12/2007   EF 45%  . CARDIAC CATHETERIZATION  01/18/2005     EF 50-55%  . CARDIAC CATHETERIZATION  01/14/2003   EF 55%  . CARDIOVASCULAR STRESS TEST  02/07/2001  . CATARACT EXTRACTION Bilateral   . COLONOSCOPY    . CORONARY ANGIOPLASTY     1987  . CORONARY ANGIOPLASTY WITH STENT PLACEMENT     1998  . CORONARY ARTERY BYPASS GRAFT     X 4  . EYE SURGERY    . LUMBAR LAMINECTOMY/DECOMPRESSION MICRODISCECTOMY Right 10/15/2015   Procedure: LUMBAR THREE-FOUR LUMBAR LAMINECTOMY/DECOMPRESSION MICRODISCECTOMY ;  Surgeon: Tressie Stalker, MD;  Location: [facility];  Service: Neurosurgery;  Laterality: Right;  Right L34 microdiskectomy  . SHOULDER ARTHROSCOPY  2013   right  . SHOULDER ARTHROSCOPY WITH ROTATOR CUFF REPAIR AND SUBACROMIAL DECOMPRESSION Left 05/14/2014   Procedure: LEFT SHOULDER ARTHROSCOPY WITH DEBRIDEMENT EXTENSIVE, DISAL CLAVICULECTOMY, SUBACROMIAL DECOMPRESSION PARTIAL ACROMIOPLASTY WITH CORACOACROMIAL RELEASE AND ROTATOR CUFF REPAIR;  Surgeon: Nilda Simmer, MD;  Location: [facility];  Service: Orthopedics;  Laterality: Left;  . TONSILLECTOMY    . UPPER GASTROINTESTINAL ENDOSCOPY    . UPPER GI ENDOSCOPY     X 3  . US ECHOCARDIOGRAPHY  08/23/2005   EF 50-55%    Family History  Problem Relation Age of Onset  . Heart attack Mother   .  Heart attack Father   . Prostate cancer Brother   . Colon cancer Neg Hx   . Esophageal cancer Neg Hx   . Rectal cancer Neg Hx   . Stomach cancer Neg Hx     Allergies  Allergen Reactions  . Statins Other (See Comments)    Muscle aches  . Crestor [Rosuvastatin Calcium]     Aches   . Latex Other (See Comments)    Makes skin red & causes irritation    Current Outpatient Prescriptions on File Prior to Visit  Medication Sig Dispense Refill  . ALPHAGAN P 0.1 % SOLN Place 1 drop into the right eye 2 (two) times daily.     Marland Kitchen aspirin 81 MG tablet Take 81 mg by mouth daily.      . calcium-vitamin D (OSCAL WITH D) 500-200 MG-UNIT per tablet Take 2 tablets by mouth daily with  breakfast.    . celecoxib (CELEBREX) 200 MG capsule TAKE 1 CAPSULE BY MOUTH 2 TIMES A DAY 180 capsule 1  . clopidogrel (PLAVIX) 75 MG tablet TAKE 1 TABLET BY MOUTH DAILY 90 tablet 1  . docusate sodium (COLACE) 100 MG capsule Take 100 mg by mouth 2 (two) times daily.    Marland Kitchen esomeprazole (NEXIUM) 40 MG capsule Take 1 tab twice daily. 90 capsule 3  . ezetimibe (ZETIA) 10 MG tablet TAKE 1 TABLET (10 MG TOTAL) BY MOUTH DAILY. 90 tablet 3  . FLUZONE HIGH-DOSE 0.5 ML SUSY Inject as directed once.  0  . gabapentin (NEURONTIN) 100 MG capsule Take 100 mg by mouth at bedtime.    Marland Kitchen loratadine (CLARITIN) 10 MG tablet Take 10 mg by mouth daily.      . Multiple Vitamins-Minerals (PRESERVISION/LUTEIN PO) Take 1 tablet by mouth 2 (two) times daily.     . predniSONE (DELTASONE) 5 MG tablet TAKE 1 TABLET BY MOUTH DAILY 90 tablet 1  . tamsulosin (FLOMAX) 0.4 MG CAPS capsule TAKE 2 CAPSULES (0.8 MG TOTAL) BY MOUTH DAILY. 180 capsule 3  . temazepam (RESTORIL) 30 MG capsule TAKE 1 CAPSULE AT BEDTIME 30 capsule 2   No current facility-administered medications on file prior to visit.     BP 130/80 (BP Location: Left Arm, Patient Position: Sitting, Cuff Size: Normal)   Pulse 77   Temp 97.9 F (36.6 C) (Oral)   Ht 5\' 7"  (1.702 m)   Wt 150 lb 9.6 oz (68.3 kg)   BMI 23.59 kg/m     Review of Systems  Constitutional: Negative for appetite change, chills, fatigue and fever.  HENT: Negative for congestion, dental problem, ear pain, hearing loss, sore throat, tinnitus, trouble swallowing and voice change.   Eyes: Negative for pain, discharge and visual disturbance.  Respiratory: Negative for cough, chest tightness, wheezing and stridor.   Cardiovascular: Negative for chest pain, palpitations and leg swelling.  Gastrointestinal: Negative for abdominal distention, abdominal pain, blood in stool, constipation, diarrhea, nausea and vomiting.  Genitourinary: Negative for difficulty urinating, discharge, flank pain,  genital sores, hematuria and urgency.  Musculoskeletal: Positive for arthralgias, back pain, gait problem, neck pain and neck stiffness. Negative for joint swelling and myalgias.  Skin: Negative for rash.  Neurological: Negative for dizziness, syncope, speech difficulty, weakness, numbness and headaches.  Hematological: Negative for adenopathy. Does not bruise/bleed easily.  Psychiatric/Behavioral: Negative for behavioral problems and dysphoric mood. The patient is not nervous/anxious.        Objective:   Physical Exam  Constitutional: He is oriented to person, place, and time.  He appears well-developed.  HENT:  Head: Normocephalic.  Right Ear: External ear normal.  Left Ear: External ear normal.  Eyes: Conjunctivae and EOM are normal.  Neck: Normal range of motion.  Cardiovascular: Normal rate and normal heart sounds.   Pulmonary/Chest: Breath sounds normal.  Abdominal: Bowel sounds are normal.  Musculoskeletal: Normal range of motion. He exhibits no edema or tenderness.  Neurological: He is alert and oriented to person, place, and time.  Psychiatric: He has a normal mood and affect. His behavior is normal.          Assessment & Plan:   Essential hypertension, stable Chronic low back pain Osteoarthritis Coronary artery disease, stable  No change in medical regimen Recent lab reviewed CPX 6 months  [email or web address],PETER Homero Fellers

## 2016-12-31 NOTE — Progress Notes (Signed)
Pre visit review using our clinic review tool, if applicable. No additional management support is needed unless otherwise documented below in the visit note. 

## 2017-02-01 ENCOUNTER — Other Ambulatory Visit: Payer: Self-pay | Admitting: Physician Assistant

## 2017-02-15 ENCOUNTER — Other Ambulatory Visit: Payer: Self-pay | Admitting: Internal Medicine

## 2017-02-20 ENCOUNTER — Other Ambulatory Visit: Payer: Self-pay | Admitting: Internal Medicine

## 2017-04-28 ENCOUNTER — Other Ambulatory Visit: Payer: Self-pay | Admitting: Internal Medicine

## 2017-04-28 MED ORDER — CELECOXIB 200 MG PO CAPS: ORAL_CAPSULE | ORAL | 1 refills | Status: AC

## 2017-05-12 ENCOUNTER — Other Ambulatory Visit: Payer: Self-pay | Admitting: Nurse Practitioner

## 2017-05-12 DIAGNOSIS — I2510 Atherosclerotic heart disease of native coronary artery without angina pectoris: Secondary | ICD-10-CM

## 2017-05-13 ENCOUNTER — Other Ambulatory Visit: Payer: Medicare Other

## 2017-05-13 DIAGNOSIS — I2510 Atherosclerotic heart disease of native coronary artery without angina pectoris: Secondary | ICD-10-CM

## 2017-05-13 LAB — COMPREHENSIVE METABOLIC PANEL
A/G RATIO: 1.8 (ref 1.2–2.2)
ALT: 10 IU/L (ref 0–44)
AST: 21 IU/L (ref 0–40)
Albumin: 4.2 g/dL (ref 3.5–4.7)
Alkaline Phosphatase: 43 IU/L (ref 39–117)
BUN/Creatinine Ratio: 14 (ref 10–24)
BUN: 11 mg/dL (ref 8–27)
Bilirubin Total: 0.3 mg/dL (ref 0.0–1.2)
CALCIUM: 9.8 mg/dL (ref 8.6–10.2)
CO2: 29 mmol/L (ref 20–29)
Chloride: 99 mmol/L (ref 96–106)
Creatinine, Ser: 0.8 mg/dL (ref 0.76–1.27)
GFR calc Af Amer: 96 mL/min/{1.73_m2} (ref >59)
GFR, EST NON AFRICAN AMERICAN: 83 mL/min/{1.73_m2} (ref >59)
Globulin, Total: 2.4 g/dL (ref 1.5–4.5)
Glucose: 106 mg/dL — ABNORMAL HIGH (ref 65–99)
POTASSIUM: 4.8 mmol/L (ref 3.5–5.2)
Sodium: 141 mmol/L (ref 134–144)
Total Protein: 6.6 g/dL (ref 6.0–8.5)

## 2017-05-13 LAB — LIPID PANEL
CHOL/HDL RATIO: 2.7 ratio (ref 0.0–5.0)
Cholesterol, Total: 191 mg/dL (ref 100–199)
HDL: 72 mg/dL (ref >39)
LDL Calculated: 97 mg/dL (ref 0–99)
TRIGLYCERIDES: 111 mg/dL (ref 0–149)
VLDL Cholesterol Cal: 22 mg/dL (ref 5–40)

## 2017-05-18 ENCOUNTER — Other Ambulatory Visit: Payer: Self-pay | Admitting: Internal Medicine

## 2017-05-31 ENCOUNTER — Other Ambulatory Visit: Payer: Self-pay | Admitting: Physician Assistant

## 2017-06-10 ENCOUNTER — Telehealth: Payer: Self-pay | Admitting: Family Medicine

## 2017-06-12 ENCOUNTER — Encounter: Payer: Self-pay | Admitting: Family Medicine

## 2017-06-12 NOTE — Telephone Encounter (Signed)
See phone note

## 2017-06-29 DEATH — deceased

## 2017-07-08 ENCOUNTER — Other Ambulatory Visit: Payer: Medicare Other

## 2017-07-14 ENCOUNTER — Ambulatory Visit: Payer: Medicare Other | Admitting: Cardiovascular Disease

## 2017-07-15 ENCOUNTER — Encounter: Payer: Medicare Other | Admitting: Internal Medicine
